# Patient Record
Sex: Female | Born: 1935
Health system: Southern US, Community
[De-identification: ages and names within clinical notes are randomized; demographics above are authoritative.]

## PROBLEM LIST (undated history)

## (undated) DIAGNOSIS — R269 Unspecified abnormalities of gait and mobility: Secondary | ICD-10-CM

## (undated) DIAGNOSIS — C44310 Basal cell carcinoma of skin of unspecified parts of face: Secondary | ICD-10-CM

## (undated) DIAGNOSIS — F411 Generalized anxiety disorder: Secondary | ICD-10-CM

## (undated) DIAGNOSIS — S2249XA Multiple fractures of ribs, unspecified side, initial encounter for closed fracture: Secondary | ICD-10-CM

## (undated) DIAGNOSIS — N390 Urinary tract infection, site not specified: Secondary | ICD-10-CM

## (undated) DIAGNOSIS — D649 Anemia, unspecified: Secondary | ICD-10-CM

## (undated) DIAGNOSIS — G4700 Insomnia, unspecified: Secondary | ICD-10-CM

## (undated) DIAGNOSIS — C44300 Unspecified malignant neoplasm of skin of unspecified part of face: Secondary | ICD-10-CM

## (undated) DIAGNOSIS — K219 Gastro-esophageal reflux disease without esophagitis: Secondary | ICD-10-CM

## (undated) DIAGNOSIS — I10 Essential (primary) hypertension: Secondary | ICD-10-CM

## (undated) DIAGNOSIS — L218 Other seborrheic dermatitis: Secondary | ICD-10-CM

## (undated) DIAGNOSIS — K117 Disturbances of salivary secretion: Secondary | ICD-10-CM

## (undated) DIAGNOSIS — N318 Other neuromuscular dysfunction of bladder: Secondary | ICD-10-CM

## (undated) DIAGNOSIS — IMO0002 Reserved for concepts with insufficient information to code with codable children: Secondary | ICD-10-CM

## (undated) DIAGNOSIS — R739 Hyperglycemia, unspecified: Secondary | ICD-10-CM

## (undated) DIAGNOSIS — F329 Major depressive disorder, single episode, unspecified: Secondary | ICD-10-CM

## (undated) DIAGNOSIS — F32A Depression, unspecified: Secondary | ICD-10-CM

## (undated) DIAGNOSIS — C50919 Malignant neoplasm of unspecified site of unspecified female breast: Secondary | ICD-10-CM

## (undated) DIAGNOSIS — M25519 Pain in unspecified shoulder: Secondary | ICD-10-CM

## (undated) DIAGNOSIS — S2220XA Unspecified fracture of sternum, initial encounter for closed fracture: Secondary | ICD-10-CM

## (undated) DIAGNOSIS — R1310 Dysphagia, unspecified: Secondary | ICD-10-CM

## (undated) DIAGNOSIS — M1990 Unspecified osteoarthritis, unspecified site: Secondary | ICD-10-CM

## (undated) DIAGNOSIS — D493 Neoplasm of unspecified behavior of breast: Secondary | ICD-10-CM

## (undated) DIAGNOSIS — H259 Unspecified age-related cataract: Secondary | ICD-10-CM

## (undated) DIAGNOSIS — E785 Hyperlipidemia, unspecified: Secondary | ICD-10-CM

## (undated) DIAGNOSIS — E559 Vitamin D deficiency, unspecified: Secondary | ICD-10-CM

## (undated) DIAGNOSIS — E871 Hypo-osmolality and hyponatremia: Secondary | ICD-10-CM

## (undated) DIAGNOSIS — R32 Unspecified urinary incontinence: Secondary | ICD-10-CM

## (undated) DIAGNOSIS — I872 Venous insufficiency (chronic) (peripheral): Secondary | ICD-10-CM

## (undated) DIAGNOSIS — I6200 Nontraumatic subdural hemorrhage, unspecified: Secondary | ICD-10-CM

## (undated) DIAGNOSIS — J309 Allergic rhinitis, unspecified: Secondary | ICD-10-CM

## (undated) DIAGNOSIS — M810 Age-related osteoporosis without current pathological fracture: Secondary | ICD-10-CM

## (undated) DIAGNOSIS — F418 Other specified anxiety disorders: Secondary | ICD-10-CM

## (undated) DIAGNOSIS — K644 Residual hemorrhoidal skin tags: Secondary | ICD-10-CM

## (undated) HISTORY — PX: BREAST SURGERY: SHX581

## (undated) HISTORY — DX: Unspecified urinary incontinence: R32

## (undated) HISTORY — DX: Generalized anxiety disorder: F41.1

## (undated) HISTORY — DX: Reserved for concepts with insufficient information to code with codable children: IMO0002

## (undated) HISTORY — DX: Vitamin D deficiency, unspecified: E55.9

## (undated) HISTORY — DX: Pain in unspecified shoulder: M25.519

## (undated) HISTORY — DX: Anemia, unspecified: D64.9

## (undated) HISTORY — DX: Allergic rhinitis, unspecified: J30.9

## (undated) HISTORY — DX: Other specified anxiety disorders: F41.8

## (undated) HISTORY — DX: Urinary tract infection, site not specified: N39.0

## (undated) HISTORY — DX: Hyperglycemia, unspecified: R73.9

## (undated) HISTORY — DX: Gastro-esophageal reflux disease without esophagitis: K21.9

## (undated) HISTORY — DX: Dysphagia, unspecified: R13.10

## (undated) HISTORY — DX: Disturbances of salivary secretion: K11.7

## (undated) HISTORY — DX: Malignant neoplasm of unspecified site of unspecified female breast: C50.919

## (undated) HISTORY — PX: APPENDECTOMY: SHX54

## (undated) HISTORY — PX: ABDOMINAL HYSTERECTOMY: SHX81

## (undated) HISTORY — PX: JOINT REPLACEMENT: SHX530

## (undated) HISTORY — DX: Depression, unspecified: F32.A

## (undated) HISTORY — DX: Unspecified malignant neoplasm of skin of unspecified part of face: C44.300

## (undated) HISTORY — DX: Insomnia, unspecified: G47.00

## (undated) HISTORY — DX: Unspecified abnormalities of gait and mobility: R26.9

## (undated) HISTORY — DX: Basal cell carcinoma of skin of unspecified parts of face: C44.310

## (undated) HISTORY — DX: Venous insufficiency (chronic) (peripheral): I87.2

## (undated) HISTORY — DX: Nontraumatic subdural hemorrhage, unspecified: I62.00

## (undated) HISTORY — DX: Essential (primary) hypertension: I10

## (undated) HISTORY — DX: Unspecified age-related cataract: H25.9

## (undated) HISTORY — DX: Unspecified osteoarthritis, unspecified site: M19.90

## (undated) HISTORY — DX: Other seborrheic dermatitis: L21.8

## (undated) HISTORY — DX: Major depressive disorder, single episode, unspecified: F32.9

## (undated) HISTORY — DX: Other neuromuscular dysfunction of bladder: N31.8

## (undated) HISTORY — DX: Unspecified fracture of sternum, initial encounter for closed fracture: S22.20XA

## (undated) HISTORY — DX: Multiple fractures of ribs, unspecified side, initial encounter for closed fracture: S22.49XA

## (undated) HISTORY — DX: Hypo-osmolality and hyponatremia: E87.1

## (undated) HISTORY — DX: Residual hemorrhoidal skin tags: K64.4

## (undated) HISTORY — DX: Hyperlipidemia, unspecified: E78.5

## (undated) HISTORY — DX: Neoplasm of unspecified behavior of breast: D49.3

## (undated) HISTORY — DX: Age-related osteoporosis without current pathological fracture: M81.0

---

## 1988-04-17 DIAGNOSIS — C50919 Malignant neoplasm of unspecified site of unspecified female breast: Secondary | ICD-10-CM

## 1988-04-17 HISTORY — DX: Malignant neoplasm of unspecified site of unspecified female breast: C50.919

## 1998-04-17 DIAGNOSIS — D493 Neoplasm of unspecified behavior of breast: Secondary | ICD-10-CM

## 1998-04-17 DIAGNOSIS — M810 Age-related osteoporosis without current pathological fracture: Secondary | ICD-10-CM

## 1998-04-17 HISTORY — DX: Age-related osteoporosis without current pathological fracture: M81.0

## 1998-04-17 HISTORY — DX: Neoplasm of unspecified behavior of breast: D49.3

## 1998-06-09 ENCOUNTER — Encounter: Payer: Self-pay | Admitting: Cardiology

## 1998-06-09 ENCOUNTER — Ambulatory Visit (HOSPITAL_COMMUNITY): Admission: RE | Admit: 1998-06-09 | Discharge: 1998-06-09 | Payer: Self-pay | Admitting: Cardiology

## 1998-06-11 ENCOUNTER — Encounter: Payer: Self-pay | Admitting: Cardiology

## 1998-06-11 ENCOUNTER — Ambulatory Visit (HOSPITAL_COMMUNITY): Admission: RE | Admit: 1998-06-11 | Discharge: 1998-06-11 | Payer: Self-pay | Admitting: Cardiology

## 1998-06-14 ENCOUNTER — Encounter: Payer: Self-pay | Admitting: Cardiology

## 1998-06-14 ENCOUNTER — Ambulatory Visit (HOSPITAL_COMMUNITY): Admission: RE | Admit: 1998-06-14 | Discharge: 1998-06-14 | Payer: Self-pay | Admitting: Cardiology

## 1998-06-25 ENCOUNTER — Encounter: Payer: Self-pay | Admitting: General Surgery

## 1998-06-25 ENCOUNTER — Ambulatory Visit (HOSPITAL_COMMUNITY): Admission: RE | Admit: 1998-06-25 | Discharge: 1998-06-25 | Payer: Self-pay | Admitting: General Surgery

## 1998-07-14 ENCOUNTER — Inpatient Hospital Stay (HOSPITAL_COMMUNITY): Admission: RE | Admit: 1998-07-14 | Discharge: 1998-07-16 | Payer: Self-pay | Admitting: General Surgery

## 1999-04-18 DIAGNOSIS — I872 Venous insufficiency (chronic) (peripheral): Secondary | ICD-10-CM

## 1999-04-18 HISTORY — DX: Venous insufficiency (chronic) (peripheral): I87.2

## 1999-10-04 ENCOUNTER — Encounter: Payer: Self-pay | Admitting: Orthopedic Surgery

## 1999-10-04 ENCOUNTER — Encounter: Admission: RE | Admit: 1999-10-04 | Discharge: 1999-10-04 | Payer: Self-pay | Admitting: Orthopedic Surgery

## 1999-10-06 ENCOUNTER — Ambulatory Visit (HOSPITAL_BASED_OUTPATIENT_CLINIC_OR_DEPARTMENT_OTHER): Admission: RE | Admit: 1999-10-06 | Discharge: 1999-10-06 | Payer: Self-pay | Admitting: Orthopedic Surgery

## 2000-06-21 ENCOUNTER — Encounter: Payer: Self-pay | Admitting: Orthopedic Surgery

## 2000-06-21 ENCOUNTER — Inpatient Hospital Stay (HOSPITAL_COMMUNITY): Admission: RE | Admit: 2000-06-21 | Discharge: 2000-06-27 | Payer: Self-pay | Admitting: Orthopedic Surgery

## 2000-06-25 ENCOUNTER — Encounter: Payer: Self-pay | Admitting: Orthopedic Surgery

## 2000-06-27 ENCOUNTER — Inpatient Hospital Stay (HOSPITAL_COMMUNITY)
Admission: RE | Admit: 2000-06-27 | Discharge: 2000-07-04 | Payer: Self-pay | Admitting: Physical Medicine & Rehabilitation

## 2001-02-08 ENCOUNTER — Ambulatory Visit (HOSPITAL_COMMUNITY): Admission: RE | Admit: 2001-02-08 | Discharge: 2001-02-08 | Payer: Self-pay | Admitting: Ophthalmology

## 2001-03-28 ENCOUNTER — Other Ambulatory Visit: Admission: RE | Admit: 2001-03-28 | Discharge: 2001-03-28 | Payer: Self-pay | Admitting: Gynecology

## 2003-04-18 DIAGNOSIS — R32 Unspecified urinary incontinence: Secondary | ICD-10-CM

## 2003-04-18 DIAGNOSIS — F411 Generalized anxiety disorder: Secondary | ICD-10-CM

## 2003-04-18 DIAGNOSIS — H259 Unspecified age-related cataract: Secondary | ICD-10-CM

## 2003-04-18 DIAGNOSIS — M1990 Unspecified osteoarthritis, unspecified site: Secondary | ICD-10-CM

## 2003-04-18 DIAGNOSIS — K219 Gastro-esophageal reflux disease without esophagitis: Secondary | ICD-10-CM

## 2003-04-18 DIAGNOSIS — F329 Major depressive disorder, single episode, unspecified: Secondary | ICD-10-CM

## 2003-04-18 DIAGNOSIS — E785 Hyperlipidemia, unspecified: Secondary | ICD-10-CM

## 2003-04-18 DIAGNOSIS — G4700 Insomnia, unspecified: Secondary | ICD-10-CM

## 2003-04-18 DIAGNOSIS — D649 Anemia, unspecified: Secondary | ICD-10-CM

## 2003-04-18 DIAGNOSIS — F3289 Other specified depressive episodes: Secondary | ICD-10-CM

## 2003-04-18 DIAGNOSIS — I10 Essential (primary) hypertension: Secondary | ICD-10-CM

## 2003-04-18 HISTORY — DX: Insomnia, unspecified: G47.00

## 2003-04-18 HISTORY — DX: Hyperlipidemia, unspecified: E78.5

## 2003-04-18 HISTORY — DX: Unspecified urinary incontinence: R32

## 2003-04-18 HISTORY — DX: Major depressive disorder, single episode, unspecified: F32.9

## 2003-04-18 HISTORY — DX: Other specified depressive episodes: F32.89

## 2003-04-18 HISTORY — DX: Generalized anxiety disorder: F41.1

## 2003-04-18 HISTORY — DX: Anemia, unspecified: D64.9

## 2003-04-18 HISTORY — DX: Unspecified osteoarthritis, unspecified site: M19.90

## 2003-04-18 HISTORY — DX: Essential (primary) hypertension: I10

## 2003-04-18 HISTORY — DX: Unspecified age-related cataract: H25.9

## 2003-04-18 HISTORY — DX: Gastro-esophageal reflux disease without esophagitis: K21.9

## 2004-02-20 ENCOUNTER — Inpatient Hospital Stay (HOSPITAL_COMMUNITY): Admission: EM | Admit: 2004-02-20 | Discharge: 2004-02-24 | Payer: Self-pay | Admitting: Emergency Medicine

## 2004-04-17 DIAGNOSIS — N318 Other neuromuscular dysfunction of bladder: Secondary | ICD-10-CM

## 2004-04-17 DIAGNOSIS — IMO0002 Reserved for concepts with insufficient information to code with codable children: Secondary | ICD-10-CM

## 2004-04-17 DIAGNOSIS — S2220XA Unspecified fracture of sternum, initial encounter for closed fracture: Secondary | ICD-10-CM

## 2004-04-17 HISTORY — DX: Reserved for concepts with insufficient information to code with codable children: IMO0002

## 2004-04-17 HISTORY — DX: Unspecified fracture of sternum, initial encounter for closed fracture: S22.20XA

## 2004-04-17 HISTORY — DX: Other neuromuscular dysfunction of bladder: N31.8

## 2004-04-22 ENCOUNTER — Encounter (INDEPENDENT_AMBULATORY_CARE_PROVIDER_SITE_OTHER): Payer: Self-pay | Admitting: Specialist

## 2004-04-22 ENCOUNTER — Ambulatory Visit (HOSPITAL_COMMUNITY): Admission: RE | Admit: 2004-04-22 | Discharge: 2004-04-22 | Payer: Self-pay | Admitting: Urology

## 2004-04-22 ENCOUNTER — Ambulatory Visit (HOSPITAL_BASED_OUTPATIENT_CLINIC_OR_DEPARTMENT_OTHER): Admission: RE | Admit: 2004-04-22 | Discharge: 2004-04-22 | Payer: Self-pay | Admitting: Urology

## 2004-10-07 ENCOUNTER — Emergency Department (HOSPITAL_COMMUNITY): Admission: EM | Admit: 2004-10-07 | Discharge: 2004-10-07 | Payer: Self-pay | Admitting: Emergency Medicine

## 2005-04-17 DIAGNOSIS — K117 Disturbances of salivary secretion: Secondary | ICD-10-CM

## 2005-04-17 DIAGNOSIS — I6200 Nontraumatic subdural hemorrhage, unspecified: Secondary | ICD-10-CM

## 2005-04-17 DIAGNOSIS — R269 Unspecified abnormalities of gait and mobility: Secondary | ICD-10-CM

## 2005-04-17 DIAGNOSIS — L218 Other seborrheic dermatitis: Secondary | ICD-10-CM

## 2005-04-17 HISTORY — DX: Nontraumatic subdural hemorrhage, unspecified: I62.00

## 2005-04-17 HISTORY — DX: Disturbances of salivary secretion: K11.7

## 2005-04-17 HISTORY — DX: Other seborrheic dermatitis: L21.8

## 2005-04-17 HISTORY — DX: Unspecified abnormalities of gait and mobility: R26.9

## 2005-10-31 ENCOUNTER — Ambulatory Visit (HOSPITAL_BASED_OUTPATIENT_CLINIC_OR_DEPARTMENT_OTHER): Admission: RE | Admit: 2005-10-31 | Discharge: 2005-10-31 | Payer: Self-pay | Admitting: Urology

## 2005-12-23 ENCOUNTER — Inpatient Hospital Stay (HOSPITAL_COMMUNITY): Admission: EM | Admit: 2005-12-23 | Discharge: 2005-12-26 | Payer: Self-pay | Admitting: *Deleted

## 2005-12-25 ENCOUNTER — Encounter (INDEPENDENT_AMBULATORY_CARE_PROVIDER_SITE_OTHER): Payer: Self-pay | Admitting: *Deleted

## 2006-01-15 ENCOUNTER — Encounter: Admission: RE | Admit: 2006-01-15 | Discharge: 2006-01-15 | Payer: Self-pay | Admitting: Neurosurgery

## 2006-04-17 DIAGNOSIS — M25519 Pain in unspecified shoulder: Secondary | ICD-10-CM

## 2006-04-17 HISTORY — DX: Pain in unspecified shoulder: M25.519

## 2007-01-19 ENCOUNTER — Emergency Department (HOSPITAL_COMMUNITY): Admission: EM | Admit: 2007-01-19 | Discharge: 2007-01-19 | Payer: Self-pay | Admitting: Emergency Medicine

## 2007-04-18 DIAGNOSIS — E559 Vitamin D deficiency, unspecified: Secondary | ICD-10-CM

## 2007-04-18 HISTORY — DX: Vitamin D deficiency, unspecified: E55.9

## 2008-04-17 HISTORY — PX: MOHS SURGERY: SHX181

## 2009-04-17 HISTORY — PX: CYST EXCISION: SHX5701

## 2010-04-17 DIAGNOSIS — C44300 Unspecified malignant neoplasm of skin of unspecified part of face: Secondary | ICD-10-CM

## 2010-04-17 HISTORY — DX: Unspecified malignant neoplasm of skin of unspecified part of face: C44.300

## 2010-05-18 ENCOUNTER — Other Ambulatory Visit: Payer: Self-pay | Admitting: Dermatology

## 2010-09-02 NOTE — H&P (Signed)
[address]. [facility]  Patient:    [name], [name] Visit Number: [id number] MRN: [id number]          Service Type: DSU Location: [facility] [id number] Attending Physician:  Ivor Messier Dictated by:   Guadelupe Sabin, M.D. Admit Date:  02/08/2001 Discharge Date: 02/08/2001   CC:         Maisie Fus A. Patty Sermons, M.D.   History and Physical  CHIEF COMPLAINT: This was a planned outpatient surgical admission of this 77 year old white female, admitted for cataract implant surgery of the left eye.  HISTORY OF PRESENT ILLNESS: This patient has been followed in my office since October 25, 1998 for progressive cataract formation, greater in the left than right eye.  Initial examination revealed an acuity of 20/50 -2 left eye, which was at that time corrected and improved with changing glasses.  Nuclear cataract formation was noted in both eyes, greater in the left than right eye. Over the ensuing years the cataract has become progressively worse and vision could no longer be improved with spectacle correction.  The patient has elected to proceed with cataract surgery as she now is using a magnifying lens for doing her near work and has trouble with tearing and difficulty with vision at night, during bright sunlight, and is having some difficulty doing her usual tasks of driving, seeing road signs, television, reading, night vision, bright light vision, difficulty with housework, hobbies, colors are dim and dull, trouble with depth perception.  She signed an informed consent and arrangements were made for her outpatient admission.  PAST MEDICAL HISTORY: The patient is under the care of Dr. Ronny Flurry.  Dr. Patty Sermons feels the patient is a satisfactory risk for the proposed surgery. The patient takes multiple medications under his direction.  REVIEW OF SYSTEMS: No cardiorespiratory complaints.  PHYSICAL EXAMINATION:  VITAL SIGNS: As recorded on admission,  blood pressure 153/70, temperature 97.1 degrees, heart rate 64, respirations 16.  GENERAL APPEARANCE: The patient is a pleasant, petite 77 year old white female, in no acute distress.  HEENT: Eyes, visual acuity with best correction 20/40 right eye, 20/60- left eye.  Applanation tenometry 15 mm each eye.  Slit-lamp examination, bilateral nuclear cataract formation.  Fundus examination, dilated, reveals cataract haze.  The vitreous is clear.  The retina is attached.  The optic nerve is sharply outlined and good color.  Disc/cup ratio 0.4.  The retinal blood vessels and macula appear normal.  CHEST: Lungs clear to percussion and auscultation.  HEART: Normal sinus rhythm.  No cardiomegaly, no murmurs.  ABDOMEN: Negative.  EXTREMITIES: Negative.  ADMISSION DIAGNOSIS: Senile nuclear cataract, both eyes.  SURGICAL PLAN: Cataract implant surgery, left eye now and right eye later. Dictated by:   Guadelupe Sabin, M.D. Attending Physician:  Ivor Messier DD:  02/08/01 TD:  02/09/01 Job: [id number]

## 2010-09-02 NOTE — H&P (Signed)
[name], [name]                ACCOUNT NO.:  [id number]   MEDICAL RECORD NO.:  [id number]          PATIENT TYPE:  INP   LOCATION:  1413                         FACILITY:  [facility]   PHYSICIAN:  Della Goo, M.D. DATE OF BIRTH:  1917/04/03   DATE OF ADMISSION:  12/22/2005  DATE OF DISCHARGE:                                HISTORY & PHYSICAL   ADDENDUM:  This is continuation.   PHYSICAL EXAMINATION:  BREASTS:  Right breast; no unusual masses, no nipple  discharge.  No axillary or supraclavicular adenopathy.  ABDOMEN:  Positive bowel sounds; soft, nontender, nondistended.  No  hepatosplenomegaly.  EXTREMITIES:  Without edema.  Pulses 2+ in the dorsal pedis bilaterally.  NEUROLOGIC:  The patient is alert and oriented x3.  Able to move all 4  extremities.  There are mild abrasions, stage 1, in bilateral palm areas.  There is pain on palpation of the left wrist; however, there is full range  of motion of the left wrist.  GENITOURINARY:  Deferred.   OUTPATIENT MEDICATIONS:  1. Norvasc 5 mg one p.o. q.d.  2. Diovan 80 mg one p.o. q.d.  3. Paxil 25 mg one p.o. q.d.  4. Nexium 40 mg one p.o. q.d.  5. Forteo 750 mg per 3 mL subcutaneously q.h.s.  6. Colace 100 mg p.o. q.h.s.  7. Multivitamin one p.o. q.d. with minerals.   ASSESSMENT:  An 77 year old female admitted following a fall secondary to a  syncopal episode.  1. Syncope.  2. Small subdural hematoma.  3. Left frontal hematoma.  4. History of hypertension.  5. Osteoporosis.  6. Skin cancer.  7. History of cataract.   PLAN:  The patient has been admitted to a telemetry area for cardiac  monitoring. Cardiac enzymes will be performed q.8 h. x3.  A carotid  ultrasound study and MRI of the brain will be ordered for further evaluation  of syncope.  The patient will be placed on GI and DVT prophylaxis.  She will  also be placed on fall precautions and will be out of bed with assistance.  She will continue on her regular  medications and orthostatic vital signs  will be checked in the a.m.   LABORATORY STUDIES:  White blood cell count 6.9, hemoglobin 12.5, hematocrit  35.2, platelets 205, neutrophils 76%, lymphocytes 18%.  Sodium 139,  potassium 3.6, CO2 30, chloride 104, BUN 26, creatinine 1.0, calcium 9.3.      Della Goo, M.D.  Electronically Signed     HJ/MEDQ  D:  12/23/2005  T:  12/23/2005  Job:  [id number]

## 2010-09-02 NOTE — Op Note (Signed)
[place]. [facility]  Patient:    [name], [name]                       MRN: [id number] Proc. Date: 06/21/00 Adm. Date:  [id number] Attending:  Colbert Ewing                           Operative Report  PREOPERATIVE DIAGNOSIS:  End-stage degenerative joint disease, left knee with marked varus alignment and flexion contracture.  Erosive bony changes, medial compartment.  POSTOPERATIVE DIAGNOSIS:  End-stage degenerative joint disease, left knee with marked varus alignment and flexion contracture.  Erosive bony changes, medial compartment.  OPERATION PERFORMED:  Left total knee replacement utilizing Osteonics prosthesis.  Press-Fit #7 femoral component.  Cemented #7 tibial component with 8 mm polyethylene insert.  Cemented recess nonmetal back, 26 mm patellar component.  Soft tissue balancing with medial release and also lateral retinacular release.  SURGEON:  Loreta Ave, M.D.  ASSISTANT:  Arlys John D. Petrarca, P.A.-C.  ANESTHESIA:  General.  ESTIMATED BLOOD LOSS:  Minimal.  SPECIMENS:  Excised bone and soft tissue.  COMPLICATIONS:  None.  CULTURES:  None.  DRESSING:  Soft compressive.  DRAINS:  Hemovac x 2.  TOURNIQUET TIME:  One hour.  DESCRIPTION OF PROCEDURE:  The patient was brought to the operating room and placed on the operating table in supine position.  After adequate anesthesia had been obtained, the left knee examined.  Almost a 10 degree flexion contracture with alignment in 10 degrees of varus, relatively fixed.  Flexion to about 90 degrees.  Tourniquet applied upper aspect of the left leg. Prepped and draped in the usual sterile fashion.  Exsanguinated with elevation and Esmarch.  Tourniquet inflated to 300 mmHg.  Straight incision above the patella down to the tibial tubercle.  Medial parapatellar arthrotomy with hemostasis obtained with electrocautery.  Knee exposed.  Extensive erosive changes with a 1 cm deep  groove cut into the medial femoral condyle where it had been rotting on the margin of the tibial plateau with erosive changes on the plateau as well.  Diffuse grade 4 changes in the lateral compartment and patellofemoral joint as well.  Remnants of menisci, periarticular spurring, cruciate ligaments all excised because of degree of contracture.  Medial capsular soft tissue release.  Distal femur exposed.  Intramedullary guide placed.  Distal cut set at 5 degrees of valgus removing 10 mm.  Sized for a #7 posterior stabilizing component.  Jigs put in place.  Definitive cuts made. Trial put in place and found to fit well.  Trial removed.  Tibial exposed. Tibial spine removed with a saw.  Intramedullary guide placed.  5 degree posterior slope cut set at 0 on the deficient medial side.  Collaterals and posterior structures protected.  Sized to a #7 component.  After appropriate soft tissue release and removal of all spurs, the #8 trial was placed on the tibia and femur.  With the 8 mm insert, I had full extension, full flexion and no component lift off with flexion.  The patella was then sized, reamed and drilled for a 26 mm component.  Trial put in place.  Lateral release necessary to balance the patellofemoral joint.  All trials removed.  Copious irrigation with a pulse irrigating device.  Cement prepared, placed on a tibial component which was hammered into place.  Polyethylene attached.  Femoral component hammered in place as well.  Patellar  component seated and compressed with cement in place.  Exessive cement removed.  Once the cement had hardened, the knee was re-examined with full extension, full flexion, nicely balanced knee set at 5 degrees of valgus and good patellofemoral tracking at the lateral release.  Wound irrigated.  Arthrotomy closed with #1 Vicryl after Hemovacs were placed and brought out through separate stab wounds.  Skin and subcutaneous tissues closed with Vicryl suture  and staples.  Margins of wound injected with Marcaine as was the knee.  Hemovac clamped.  Sterile compressive dressing applied.  Tourniquet deflated and removed.  Knee immobilizer applied. Anesthesia reversed.  Brought to recovery room.  Tolerated surgery well.  No complications. DD:  06/21/00 TD:  06/22/00 Job: [id number]

## 2010-09-02 NOTE — H&P (Signed)
[name], [name]                ACCOUNT NO.:  [id number]   MEDICAL RECORD NO.:  [id number]          PATIENT TYPE:  EMS   LOCATION:  ED                           FACILITY:  [facility]   PHYSICIAN:  Zenaida Deed. Mayford Knife, M.D.DATE OF BIRTH:  06-17-1916   DATE OF ADMISSION:  02/20/2004  DATE OF DISCHARGE:                                HISTORY & PHYSICAL   PRIMARY CARE PHYSICIAN:  Dr. Lenon Curt. [place].   Resident of [facility].   CHIEF COMPLAINT:  Weakness, fever.   HISTORY OF PRESENT ILLNESS:  [name] is an 77 year old woman who called  for help on her [facility] today and was brought to the emergency department  by ambulance after sustaining a fall related to weakness.   When she awoke this morning, she experienced shaking chills and noted that  she had a worsened cough and shortness of breath accompanied by mild nausea  without vomiting or diarrhea, anorexia, and feeling ill.  Yesterday she felt  reasonably well, but has been somewhat hampered by about one week of a mild  nonproductive cough, some mild rhinorrhea and some hoarseness.  She has not  been obviously febrile until today.  She received a flu shot and Pneumovax  this fall but does not recall the date.  She has a history of pneumonia  about 20 years ago and otherwise has had good lung health.   REVIEW OF SYSTEMS:  She denies dizziness but feels somewhat weak.  There has  been no headache.  No sputum production.  No chest pain.  No vomiting or  change in bowel habits which are normal.  No focal numbness or weakness.  No  new aches or pains.  She has sustained about a 10-pound weight loss over the  course of the past couple of months surrounding a bladder infection which  was treated and which caused a significant enough decline that she was  admitted to the rehab portion of [facility] for a couple of weeks, followed  by assisted living for a couple of weeks and finally returning to home.  She  did feel like  her appetite was starting to resume.   Yesterday, she saw Dr. Wanda Plump for evaluation of her overactive bladder.  He performed a cystoscopy and installation of some type of medication which  makes me think that she probably has a contracted bladder; apparently he  found a suspicious area which he would like to examine further on biopsy at  a future point.  She has had no dysuria following this test.   PAST MEDICAL HISTORY:  1.  History of osteoarthritis, status post left total knee replacement a few      years ago by Dr. Eulah Pont.  2.  History of hypertension.  3.  History of breast cancer, status post right modified mastectomy, March      2000, which was stage 1 adenoma.  A left mammogram spring 2005 was      within normal limits.  4.  History of GERD.  5.  History of hysterectomy and appendectomy.  6.  History of overactive  bladder as above.  7.  History of hyperlipidemia.  8.  History of osteoporosis with kyphosis.  9.  History of cholelithiasis managed by low-fat diet.  10. History of anxiety and depression.  11. History of basal cell cancer of the face.   MEDICATIONS:  Per list updated January 15, 2004, indicates the following.  1.  Diovan HCT 160/12.5 mg p.o. q.day.  2.  Norvasc 5 mg p.o. q.day.  3.  Centrum Silver 1 p.o. q.day.  4.  Glucosamine chondroitin MSM q.day.  5.  Lipitor 10 mg p.o. q.day.  6.  Colace 100 mg 1 to 2 q.day.  7.  Nexium 40 mg q.day.  8.  Ecotrin 81 mg p.o. q.h.s.  9.  Zoloft 150 mg p.o. q.day.   ALLERGIES:  Questionable allergy to SULFA and an EPIDURAL medicine.   FAMILY HISTORY:  Unremarkable.   SOCIAL HISTORY:  Patient is a widow of retired OB/GYN, Dr. Sherilyn Cooter, who passed  away 5 years ago.  She has a son who is a Careers adviser involved in the [employer], and a nurse practitioner daughter who practices with a  GYN group.  She has never smoked and has rarely consumed alcoholic  beverages.  She resides at [facility] independently.   Lately has had  assistance from caretakers following her recent urinary tract illness.   PHYSICAL EXAMINATION:  Temperature 101.8 on arrival, heart rate 88, blood  pressure 116/58, respirations 20.  Oxygenation 95% on 2 liters.  In general,  she is a well-developed, well-nourished, although somewhat frail, elderly  lady appearing in no acute distress.  She is delightful in conversation,  fully oriented despite feeling somewhat ill.  Her face is symmetric, native  teeth are in good repair but with some mild gingival disease, and the mouth  is notable dry.  Her neck is supple without JVD.  The lungs are clear to  auscultation anteriorly but posteriorly in the bases reveal dense, dry-  sounding crackles with poor air flow.  The heart is regular rate and rhythm  with no significant murmur, rub or gallop.  The abdomen is normal in  contour, bowel sounds, and is nontender.  Lower extremities are well  developed with full distal pedal pulses but with evidence of chronic venous  stasis hyperpigmentation about the ankles.  She moves all extremities  equally, is able to move in different positions for the exam, and  neurologically has no sensory or motor deficits.  Her skin is hot, dry, with  diminished turgor over the arms without rash.   LABS:  Urinalysis: Specific gravity 1.014 and otherwise unremarkable.  CBC:  White blood cell count 15.5, hemoglobin 12.2, platelets 177, absolute  granulocyte count 15.3 with greater than 20% bands.  Sodium 136, potassium  3.2, chloride 99, carbon dioxide 27, BUN 18, creatinine 1.0, glucose 106.  Albumin 3.1.  Hepatocellular enzymes normal.  ABG on room air: pH 7.5, pCO2  39, pO2 51, bicarbonate 29.  Chest x-ray is notable for kyphosis and for  chronic interstitial changes bilaterally.  There is no focal infiltrate and  no focal nodularity.   ASSESSMENT AND PLAN: 1.  Presentation is most consistent with pneumonia, and the acute onset is      suspicious for  pneumococcal.  The chronic lung findings are curious.  I      will treat presumptively with Rocephin and azithromycin and recheck film      on Monday.  If the clinical scenario changes or if no  focal infiltrate      is evident on follow-up film after hydration, I would recommend a chest      CT.  It appears that her prior breast cancer was fairly localized and a      metastatic problem would be unlikely, but we cannot afford to overlook      this possibility.  There is no clinical suspicion of pulmonary embolus      at this time.  There is no underlying chronic obstructive pulmonary      disease.   1.  Hypertension.  I will withhold antihypertensives until a trend      establishes requirement for such.   1.  Remainder of chronic medical problems are stable and will require no      acute intervention aside from continuation of her usual medications.      JAW/MEDQ  D:  02/20/2004  T:  02/20/2004  Job:  [id number]   cc:   Lenon Curt. Chilton Si, M.D.  [address]  [place]  [place] [address]  Fax: [phone]

## 2010-09-02 NOTE — Discharge Summary (Signed)
[place]. [facility]  Patient:    [name], [name]                       MRN: [id number] Adm. Date:  [id number] Disc. Date: [id number] Attending:  Faith Rogue T Dictator:   Oris Drone. Petrarca, P.A.-C.                           Discharge Summary  ADMISSION DIAGNOSIS:  Advanced degenerative joint disease of the left knee.  DISCHARGE DIAGNOSIS: 1. Advanced degenerative joint disease of the left knee. 2. Posthemorrhagic anemia. 3. Hypertension. 4. Esophageal reflux. 5. Pruritic disorders.  PROCEDURE:  Left total knee replacement.  HISTORY:  This is an 77 year old white female with a long-standing degenerative joint disease of her left knee.  She has failed conservative treatment including arthroscopy and ______ .  She has essentially grade 4 degenerative joint disease of the left knee.  She has failed conservative treatment and is now indicated for a total knee replacement.  HOSPITAL COURSE:  This is an 77 year old white female admitted on June 21, 2000, and after appropriate lab studies were obtained, was taken to the operating room that day where she underwent a left total knee replacement. She tolerated the procedure well.  She was placed pre and postoperatively on Ancef prophylaxis 1 g IV q.8h. for three doses.  Coumadin was begun as per protocol as well as heparin 2500 units subcutaneous q.12h. until the Coumadin became therapeutic.  Consults with PT, OT, rehabilitation, and social service were obtained.  The ambulation is weightbearing as tolerated with a walker on the left.  CPM 0 to 90 degrees, 8 hours per day, increasing to 110 degrees max.  Knee immobilizer for ambulation.  A Foley was placed intraoperatively. Total knee protocols were used.  An epidural catheter was placed intraoperatively for postoperative pain management.  She did have marked difficulty with pruritus secondary to her epidural.  This was eliminated once the epidural was  discontinued.  Epidural pulled on postoperative day #2 as well as her Foley catheter.  She was placed on Vicodin 5 mg 1-2 q.4-6h. for pain.  She was typed and crossed for 2 units and transfused on the 9th.  She did have some difficulty with abdominal discomfort and three-way abdomen was obtained.  She was placed on a low-fat diet and this resolved.  She was transferred to rehabilitation on the 13th, where she will continue with physical and occupational therapy.  LABORATORY STUDIES:  Preoperative hemoglobin was 11.5, hematocrit 34.3%, white count 5300, platelets 197,000.  Discharge hemoglobin 9.6, hematocrit 28.4%, white count 5100, platelets 261,000.  Chemistries:  Preoperative sodium 137, potassium 4.2, chloride 100, CO2 30, glucose 107, BUN 26, creatinine 1.2, calcium 9.5, total protein 6.3, albumin 3.7, AST 26, ALT 21, ALP 58, total bilirubin 0.5.  Discharge saturation was 135, potassium 3.0, chloride 93, CO2 36, glucose 98, BUN 13, creatinine 0.9, calcium 8.5.  Urinalysis ______ urine.  She was blood type B positive, antibody screen negative.  EKG showed normal sinus rhythm with nonspecific T-wave abnormality.  Abdomen with chest of June 25, 2000 revealed no active cardiopulmonary disease and an unremarkable bowel gas pattern.  Left knee postoperative June 21, 2000 revealed satisfactory left knee arthroplasty.  Chest x-ray of October 04, 1999 revealed status post right mastectomy with accentuation of hilar and peribronchial left lingula and right middle lung regions.  No evidence of metastatic disease or  acute infiltrate.  DISCHARGE INSTRUCTIONS:  She was transferred to rehabilitation where she will continue with her physical and occupational therapy as per protocol.  DISCHARGE CONDITION:  Discharged in improved condition. DD:  08/10/00 TD:  08/12/00 Job: [id number]

## 2010-09-02 NOTE — Op Note (Signed)
[place]. [facility]  Patient:    [name], [name] Visit Number: [id number] MRN: [id number]          Service Type: DSU Location: [facility] [id number] [id number] Attending Physician:  Ivor Messier Dictated by:   Guadelupe Sabin, M.D. Proc. Date: 02/08/01 Admit Date:  02/08/2001 Discharge Date: 02/08/2001   CC:         Maisie Fus A. Patty Sermons, M.D.   Operative Report  PREOPERATIVE DIAGNOSIS: Senile nuclear cataract, left eye.  POSTOPERATIVE DIAGNOSIS: Senile nuclear cataract, left eye.  OPERATION/PROCEDURE: Planned extracapsular cataract extraction with phacoemulsification, primary insertion of posterior chamber intraocular lens implant.  SURGEON: Guadelupe Sabin, M.D.  ASSISTANT: Nurse.  ANESTHESIA: Local 4% xylocaine, 0.75% Marcaine retrobulbar block, topical tetracaine, intraocular xylocaine.  Anesthesia standby required.  Patient given sodium pentothal or Ditropan intravenously during the period of retrobulbar injection.  DESCRIPTION OF PROCEDURE: After the patient was prepped and draped a lid speculum was inserted in the left eye.  The eye was turned downward and a superior rectus traction suture placed.  Schiotz tenometry was recorded at 7-8 scale units with a 5.5 g weight.  A peritomy was performed adjacent to the limbus from the 11 oclock to one oclock position.  The corneoscleral junction was cleaned and a corneoscleral groove made with a 45 degree Superblade.  The anterior chamber was then entered with the 2.5 mm diamond keratome at the 12 oclock position and the 15 degree blade at the 2:30 oclock position.  Using a bent 26 gauge needle on a Healon syringe a circular capsulorrhexis was begun and then completed with the Grabow forceps. Hydrodissection and hydrodelineation were performed using 1% xylocaine.  The 30 degree phacoemulsification tip was then inserted, with slow controlled emulsification of the lens nucleus.  The rather firm nucleus was  chopped from behind with the Bechert pick.  Following removal of the nucleus the residual cortex was aspirated.  A small amount of residual cortex remained at the 12 oclock position under the iris and under the capsulorrhexis.  This was difficult to aspirate.  The posterior capsule appeared intact and somewhat thickened, with some posterior capsule plaque haze.  This was difficult to remove but it was elected not to perform a posterior capsulotomy at this time. It was therefore elected to insert an [employer] Optics [id number] silicone posterior chamber intraocular lens implant with UV absorber, diopter strength +19.50.  This was inserted with the [employer] forceps into the anterior chamber and then centered into the capsular bag using the Lister lens rotator. The lens appeared to be well centered.  The Healon which had been used during the procedure was aspirated and replaced with balanced salt and Miochol ophthalmic solution.  The operative incisions appeared to be self-sealing no sutures were required.  Maxitrol ointment was instilled in the conjunctival cul-de-sac and a light patch and protective shield applied.  Duration of procedure and anesthesia administration, 45 minutes.  The patient tolerated the procedure well in general and left the operating room for the recovery room in good condition. Dictated by:   Guadelupe Sabin, M.D. Attending Physician:  Ivor Messier DD:  02/08/01 TD:  02/09/01 Job: [id number]

## 2010-09-02 NOTE — Discharge Summary (Signed)
[name], [name]                ACCOUNT NO.:  [id number]   MEDICAL RECORD NO.:  [id number]          PATIENT TYPE:  INP   LOCATION:  1413                         FACILITY:  [facility]   PHYSICIAN:  Hettie Holstein, D.O.    DATE OF BIRTH:  [birth year]   DATE OF ADMISSION:  12/22/2005  DATE OF DISCHARGE:  12/26/2005                           DISCHARGE SUMMARY - REFERRING   PRIMARY CARE PHYSICIAN:  Lenon Curt. Chilton Si, M.D.   Patient is a resident of [facility].   NEUROSURGEON:  Reinaldo Meeker, M.D., which he has a follow-up appointment  on January 02, 2006.   REASON FOR ADMISSION:  Status post fall.   DIAGNOSIS:  Syncope, undetermined etiology, negative Doppler studies, 2-D  echocardiogram  currently pending.  Did exhibit some orthostasis early in  her hospitalization.  Her antihypertensive medication, Avapro, was held and  she developed no further symptoms during her hospital course.  Her 2-D  echocardiogram  results are currently pending and I have asked that she  follow up on these results following discharge.   ADDITIONAL DIAGNOSES:  1. Subdural hematoma, status post fall, status post consultation with Dr.      Gerlene Fee of neurosurgery who performed serial CTs and felt that she was      medically stable for discharge and follow-up in the outpatient setting.  2. History of compression fracture.  3. History of osteoarthritis.  4. Left total knee replacement.  5. History of breast cancer.  6. History of hypertension.   DISCHARGE MEDICATIONS:  As noted above.  She is instructed to hold her  aspirin x1 week and as well, hold her Diovan until further instructions.  She is asked to use Tylenol for management of headaches.  She should  continue all of her medications as prior to admission including:  1. Vitamin daily.  2. Colace 100 mg b.i.d.  3. Norvasc 5 mg daily.  4. Nexium 40 mg daily.  5. Lipitor 10 mg nightly.  6. Citracal with D three times a day.  7. Tylenol 650 q.4-6h.  p.r.n.  8. Boniva 150 mg monthly as before.  9. Ultram 50 mg p.o. q.6h. p.r.n.  10.Zyrtec 10 mg daily.  11.Lunesta 1 mg p.o. nightly.  12.Paxil CR 25 mg daily.  13.Forteo 20 mcg subcu daily as before.   LABORATORY TESTS PERFORMED THIS ADMISSION:  Carotid Dopplers, which were  unremarkable.  Due to weekend, 2-D echocardiogram  was delayed and we do not  have these results available at this time but are available on follow-up.  I  have provided the family with my phone number for which they can contact me  if they are unable to receive these results __________ primary care  physician.  She underwent MRI of the brain that revealed left middle cranial  fossa and anterior cranial fossa with subdural hematoma which was no more  than a few millimeters thick and does not have any significant mass effect  upon the brain.  It was not felt that it had enlarged since recent CT scan.  There were small intraparenchymal hemorrhages in the left frontal  subcortical region and in the right parietal white matter.  Both of these  are small and no not exert any mass effect.  There was atrophy of the  chronic small vessel changes elsewhere.   LABORATORY DATA:  Sodium 141, potassium 3.7, BUN 24, creatinine 1.1, calcium  9.5.  Cardiac panels were all negative and within normal limits.  TSH was  1.365.   X-rays of her right wrist were also unremarkable for fracture.  CT of her  head revealed small left frontotemporal subdural hematoma and there is a  questionable nondisplaced calvarial fracture.   DISPOSITION:  [name] has a follow-up appointment with Dr. Gerlene Fee on  January 02, 2006, at 4 p.m. at [facility].  She is instructed  to contact Dr. Frederik Pear for follow-up this week.   HISTORY OF PRESENT ILLNESS:  For full details, please refer to the H&P as  dictated by Dr. Della Goo.  However, briefly, [name] is an 77-  year-old female resident of [facility] who  suffered a fall  after passing out while walking in the courtyard at the facility shortly  after her evening meal.  She denied any chest pain or shortness of breath  associated with episodes and denies also having any nausea, vomiting or  diarrhea, fever or chills.  She did not have any loss of balance associated  with episode either.  She did report feeling a little bit lightheaded. She  had complaints of left forehead pain and swelling from her fall.  She had  hit her head.  She underwent a CT of her brain in the emergency department  which revealed a large left frontal area hematoma and small subdural  hematoma.  Neurosurgery was notified and it was determined at that time that  she was not in need of evacuation.   HOSPITAL COURSE:  She was admitted.  Again, her course was uneventful.  She  did exhibit some orthostasis initially but this responded to IV fluids an  withholding of her antihypertensive medications.  Her hospital course was  that of stability and improvement, no recurrence of symptoms, no arrhythmia,  no evidence of acute cardiac event.  She was seen by Dr. Gerlene Fee in follow-  up who felt that she was stable for discharge home.  Again, she has  undergone work-up and evaluation for syncope without determined etiology at  this time and currently pending is the 2-D echocardiogram  which can be  followed up once discharged.      Hettie Holstein, D.O.  Electronically Signed     ESS/MEDQ  D:  12/26/2005  T:  12/26/2005  Job:  [id number]   cc:   Lenon Curt. Chilton Si, M.D.  Fax: [phone]   Reinaldo Meeker, M.D.  Fax: [phone]

## 2010-09-02 NOTE — Discharge Summary (Signed)
[place]  Patient:    [name], FOLSON                       MRN: [id number] Adm. Date:  [id number] Disc. Date: [id number] Attending:  Colbert Ewing Dictator:   Dian Situ, P.A. CC:         Clovis Pu. Patty Sermons, M.D.  Robert A. Thurston Hole, M.D.   Discharge Summary  DISCHARGE DIAGNOSES: 1. Status post left total knee replacement. 2. Postoperative anemia. 3. Hypertension. 4. Mild hyponatremia.  HISTORY OF PRESENT ILLNESS:  [name] is an 77 year old female with a history of hypertension, breast cancer, degenerative joint disease of the left knee, who failed conservative therapy, and elected to undergo a left total knee replacement by Dr. Elana Alm. Wainer.  Postoperative she was weightbearing as tolerated, and on Coumadin for deep vein thrombosis prophylaxis.  She received 2 units of packed red blood cells, secondary to a drop in the H&H.  She has had issues of hypokalemia with a potassium at 3.0 today.  She was noted to have some watery diarrhea.  A KUB done which showed unremarkable bowel gas pattern.  The patient has been making unsteady progress.  She is close supervision for transfers.  Ambulated 150 feet needing cues for safety.  PAST MEDICAL HISTORY: 1. Significant hypertension. 2. Depression. 3. Right mastectomy for cancer. 4. Hysterectomy. 5. Degenerative joint disease. 6. Hypercholesterolemia. 7. Frequency and nocturia. 8. Gastroesophageal reflux disease. 9. Constipation.  ALLERGIES:  Question allergy to SULFA.  SOCIAL HISTORY:  The patient lives at [facility].  She was independent prior to admission.  She does not use any tobacco or alcohol.  HOSPITAL COURSE:  [name] was admitted to rehabilitation on June 27, 2000, for inpatient therapies to consist of physical therapy and occupational therapy daily.  After admission the patient has been on some Coumadin for deep vein thrombosis  prophylaxis.  LABORATORY DATA:  On admission showed hyponatremia to be improving with a potassium at 3.3.  She was supplemented for two days total.  A recheck of July 02, 2000, shows a sodium of 133, potassium 3.6, chloride 95, CO2 of 33, BUN 3, creatinine 1.2, glucose 112.  CBC on admission showed a hematocrit of 29.0, hemoglobin 10.9, white count 5.0, platelets 307.  The patients left knee incision has been healing well, without any signs or symptoms of infection or drainage.  No erythema noted.  The patients blood pressure has been well-controlled.  A PT INR at the time of discharge was 18.9 and 2.0 respectively.  The patient is discharged on 5 mg q.d. of Coumadin.  INSTRUCTIONS:  The pro time is to be checked on Friday, and the pharmacy at the nursing home to adjust and follow the Coumadin for two additional weeks. At the time of discharge the patient is minimal assist for bed mobility, supervision for transfers, close supervision for ambulating short distances. Knee flexion is at 112 degrees.  She is at supervision for ADL needs, requiring increased time.  Further follow-up progressive therapies to continue at [facility] after discharge.  DISPOSITION:  On July 04, 2000, the patient is discharged to [facility].  DISCHARGE MEDICATIONS:  1. Coumadin 5 mg q.d. x 2 weeks.  2. Lipitor 10 mg q.h.s.  3. Trinsicon one p.o. b.i.d.  4. Ativan 0.5 mg q.h.s.  5. Os-Cal plus D 500 mg t.i.d.  6. Zoloft 50 mg q.d.  7. Detrol LA 4 mg q.d.  8. _______ 10 mg b.i.d.  9. Lasix 20 mg q.d. 10. Protonix. 11. Nexium 40 mg q.d. 12. Diovan. 13. Hydrochlorothiazide 12.5 mg one p.o. q.d. 14. Norvasc 5 mg q.d. 15. Senna S increased to two p.o. b.i.d. 16. Vicodin one to two p.o. q.4-6h. p.r.n. pain.  ACTIVITIES:  Supervision, use walker.  DIET:  Regular diet.  WOUND CARE:  Please keep the area clean and dry.  SPECIAL INSTRUCTIONS:  Progressive PT and OT to continue after discharge.   The pro time to be checked on Friday, with the Coumadin dose to be adjusted to keep the INR between 2.0 to 3.0 range.  Continue the Coumadin for two additional weeks.  FOLLOWUP:  The patient is to follow up with Dr. Thurston Hole for postoperative check in the next three to four weeks.  Follow up with Dr. Faith Rogue as needed. DD:  07/03/00 TD:  07/03/00 Job: [id number]

## 2010-09-02 NOTE — Op Note (Signed)
NAMESHEROL, SABAS                ACCOUNT NO.:  [id number]   MEDICAL RECORD NO.:  [id number]          PATIENT TYPE:  AMB   LOCATION:  NESC                         FACILITY:  [facility]   PHYSICIAN:  Boston Service, M.D.DATE OF BIRTH:  08/06/1916   DATE OF PROCEDURE:  04/22/2004  DATE OF DISCHARGE:                                 OPERATIVE REPORT   PREOPERATIVE DIAGNOSIS:  Microhematuria, nocturia with daytime frequency.  Cystoscopy shows area of erythematous mucosa adjacent to a bladder  diverticulum.  Plans made for cystoscopy under anesthesia with retrogrades  and bladder biopsy.   OPERATION PERFORMED:   SURGEON:  Boston Service, M.D.   ANESTHESIA:  General.   DRAINS:  20 French Foley.   SPECIMENS:  Bladder biopsy.  Posterior right wall times four.   ESTIMATED BLOOD LOSS:  Minimal.   COMPLICATIONS:  None.   DESCRIPTION OF PROCEDURE:  Patient was prepped and draped with great care in  dorsal lithotomy position after institution of an adequate level of general  anesthesia (LMA).  A well lubricated 21 French panendoscope was gently  inserted at the urethral meatus, normal urethra and sphincter. Normal  trigone and orifices.  Diverticulum right posterior bladder wall was noted  as well as adjacent erythematous mucosa.  No evidence of stone or papillary  lesion within the bladder.  Right and left retrogrades were performed using  a blocking catheter.  Ureters were somewhat tortuous consistent with  patient's age but no evidence of filling defect or obstruction.  Prompt  drainage at three to five minutes on both sides.  Biopsy forceps were then  inserted.  Representative samples were taken of the  erythematous mucosa along the posterior bladder wall.  Once samples had been  obtained, Bugbee electrode was inserted, adequate hemostasis was obtained,  20 [place] Foley catheter was inserted and left to straight drain.  The  patient was then transferred to the recovery room in  satisfactory condition.      RH/MEDQ  D:  04/22/2004  T:  04/22/2004  Job:  [id number]   cc:   Lenon Curt. Chilton Si, M.D.  [address]  [place]  [place] [address]  Fax: [phone]   Gretta Cool, M.D.  [address] [place]  [place] [address]  Fax: [phone]

## 2010-09-02 NOTE — Discharge Summary (Signed)
[name], [name] NO.:  [id number]   MEDICAL RECORD NO.:  [id number]          PATIENT TYPE:  INP   LOCATION:  0465                         FACILITY:  [facility]   PHYSICIAN:  Maxwell Caul, M.D.DATE OF BIRTH:  Mar 05, 1917   DATE OF ADMISSION:  02/20/2004  DATE OF DISCHARGE:  02/24/2004                                 DISCHARGE SUMMARY   HISTORY:  This is an 77 year old woman who lives in the independent part of  [place].  She called for help on her life line on the day of admission  and was brought to the emergency room after sustaining a fall related to  weakness. The patient states she was well until the morning of admission  when she suddenly developed an shaking chill, worsening cough and shortness  of breath.  She had had a one week history of a mild nonproductive cough and  some rhinorrhea. She had not been obviously febrile until the day of  admission. She did receive a flu shot and Pneumovax this fall which would  have been recently. She has been in good health recently although she has  had a workup for urinary frequency by Boston Service, M.D.   REVIEW OF SYMPTOMS:  CARDIAC:  No chest pain. GI:  No nausea, no vomiting,  no dysphagia. She has had a 10 pound weight loss over the past two months.   PAST MEDICAL HISTORY:  1.  History of osteoarthritis status post left total knee replacement.  2.  History of hypertension.  3.  History of breast cancer status post right modified mastectomy in March      2000. She has had a recent left mammogram which was normal.  4.  History of gastroesophageal reflux disease.  5.  History of hysterectomy and appendectomy.  6.  History of overactive bladder.  7.  History of hyperlipidemia.  8.  History of osteoporosis with kyphosis.  9.  History of cholelithiasis.  10. History of anxiety and hypertension.  11. Basal cell carcinoma of the face.   MEDICATIONS:  1.  Diovan.  2.  Hydrochlorothiazide 160/12.5  p.o. q.d.  3.  Norvasc 5 p.o. q.d.  4.  Centrum silver 1 p.o. q.d.  5.  Glucosamine chondroitin q.d.  6.  Lipitor 10 q.d.  7.  Colace 100, 1-2 q.d.  8.  Nexium 40 q.d.  9.  Ecotrin 80, 1 q.d.  10. Zoloft 150 q.d.   SOCIAL HISTORY:  She lives in the independent part of [place]. She has  never smoked and has rarely consumed alcohol.   PHYSICAL EXAMINATION:  VITAL SIGNS:  On admission temperature was 101.8,  blood pressure was 116/58, oxygenation was 95% on 2 liters.  GENERAL:  She did not look to be in obvious distress.  NECK:  Supple without JVD.  LUNGS:  Clear anteriorly but posteriorly in the bases, there were dry  sounding crackles with poor air flow.  CARDIAC:  Heart sounds were normal.  There was no significant murmurs,  gallops or rubs.  ABDOMEN:  Normal.  No masses were noted.  NEUROLOGIC:  Nonfocal.   LABORATORY DATA ON ADMISSION:  A urinalysis was unremarkable. CBC showed a  white count of 15.5 with an absolute granulocyte count of 15.3 and greater  than 20% bands. Hemoglobin was 12.2, sodium was 136, potassium 3.2, chloride  99, CO2 27, BUN 18, creatinine 1.  Hepatocellular enzymes were normal.  ABG  on room air showed a pH of 7.5, pCO2 of 39, PO2 of 51 and bicarb of 29.  A  chest x-ray was notable for kyphosis and for chronic interstitial changes  bilaterally. There was no focal infiltrate.   HOSPITAL COURSE:  This lady was admitted with a classic presentation for  bacterial pneumonia perhaps superimposed on a recent mild viral illness.  In  spite of this, her chest x-ray was reasonably unremarkable.  She went on to  have a CT scan of the chest done because of this which showed small  bilateral pleural effusions, bibasilar atelectasis, bilateral scarring  and/or subsegmental atelectasis.  There was a patchy ground glass appearance  in both upper lobes, right greater than left with a peripheral distribution.  Considerations and differential include an acute  inflammatory process such  as bronchopneumonia.  If this were a more chronic process, other  considerations including bronchiolitis obliterans or eosinophilic  pneumonitis would be a consideration. There was also two 3 mm nodules, one  in the lingula, one in the right middle lobe and mild intralobular septal  thickening in the lung bases either chronic disease or interstitial edema.   On treatment initiated in the emergency room including Rocephin and  azithromycin, she did well. She rapidly defervesced and her white count  returned into the normal range with a normal differential.  Her clinical  situation also improved including improvement in oxygenation.  It was noted  in the hospital that her hemoglobin dropped somewhat to a low of 9.7.  At  discharge, this was 9.8 after having been in the normal range on admission  at 12.2.  Some of this could be hydrational. A rectal exam was done and was  negative for any evidence of bleeding although guaiac stools are pending at  time of this dictation.   It was felt on the day of discharge that she was stable enough for return to  [facility] although she is generally weak and she feels she needs to have  some rehabilitation and I essentially agree with this.  She was started on  Avelox on November 7 and should continue for a seven day course of this on  return to [facility].   FINAL DIAGNOSES:  1.  Bronchopneumonia probably bacterial superimposed on a viral illness. See      discussion above.  2.  Anemia.  This will need to be followed at discharge, the source of this      was never really determined.  She will be discharged on iron therapy and      her proton pump inhibitors will continue.  It is recommended that she      have a CBC done on Friday of this week and that she be followed closely      for this problem.  3.  Urinary frequency.  This is a problem currently being worked up by Dr.     Wanda Plump.  She has a cystoscopy and we  will need to reaffirm when      followup with him in his office is required.   DISCHARGE MEDICATIONS:  1.  Diovan/hydrochlorothiazide 160/12.5, 1 p.o.  q.d.  2.  Norvasc 5 p.o. q.d.  3.  Glucosamine chondroitin daily.  4.  Lipitor 10 q.d.  5.  Nexium 40 q.d.  6.  Zoloft 150 q.d.  7.  Avelox 400 mg p.o. q.d. to continue for a further five days after return      to the facility.  8.  Ferrous sulfate 325, 1 p.o. b.i.d.   Note that her aspirin is currently on hold because of the anemia but can be  reinstituted once this is felt to be stabilized.   OTHER DISCHARGE INSTRUCTIONS:  She will need physical and occupational  therapy in order to regain her strength and stability before she can return  to her independent apartment setting.      MGR/MEDQ  D:  02/24/2004  T:  02/24/2004  Job:  [id number]

## 2010-09-02 NOTE — Discharge Summary (Signed)
[place]. [facility]  Patient:    [name], [name]                       MRN: [id number] Adm. Date:  [id number] Disc. Date: [id number] Attending:  Colbert Ewing Dictator:   Dian Situ, PA                           Discharge Summary  NO DICTATION DD:  07/03/00 TD:  07/04/00 Job: [id number]

## 2010-09-02 NOTE — Op Note (Signed)
NAMEBRIN, RUGGERIO                ACCOUNT NO.:  [id number]   MEDICAL RECORD NO.:  [id number]          PATIENT TYPE:  AMB   LOCATION:  NESC                         FACILITY:  [facility]   PHYSICIAN:  Martina Sinner, MD DATE OF BIRTH:  03/19/1917   DATE OF PROCEDURE:  10/31/2005  DATE OF DISCHARGE:  10/31/2005                                 OPERATIVE REPORT   PREOPERATIVE DIAGNOSIS:  Muscle spasm, neurogenic bladder, urge  incontinence.   POSTOPERATIVE DIAGNOSIS:  Muscle spasm, neurogenic bladder, urge  incontinence.   SURGERY:  Cystoscopy, hydrodistention, Botox injection therapy.   Ms. Sariah Henkin has muscle spasms with neurogenic bladder and urge  incontinence.  She is refractory to aggressive medical and behavioral  therapy.   The patient was prepped and draped in usual fashion.  I used a 24-French  injection scope for examination.  Bladder mucosa and trigone were normal.  There was no stitch, foreign body or carcinoma.  The bladder was  hydrodistended to 550 mL.  There is no evidence of interstitial cystitis on  re-examination   The patient was injected with 100 units of Botox in 20 sites.  I used a 0.5  mL per injection site.  I spared the trigone.  I injected cephalad to the  interureteric ridge and along 5 and 7 o'clock.   The procedure was well tolerated.  The patient was sent to recovery room.           ______________________________  Martina Sinner, MD  Electronically Signed     SAM/MEDQ  D:  11/22/2005  T:  11/22/2005  Job:  [id number]

## 2010-09-02 NOTE — H&P (Signed)
[name], [name]                ACCOUNT NO.:  [id number]   MEDICAL RECORD NO.:  [id number]          PATIENT TYPE:  INP   LOCATION:  1413                         FACILITY:  [facility]   PHYSICIAN:  Della Goo, M.D. DATE OF BIRTH:  08/19/1916   DATE OF ADMISSION:  12/22/2005  DATE OF DISCHARGE:                                HISTORY & PHYSICAL   CHIEF COMPLAINT:  Fall.   HISTORY OF PRESENT ILLNESS:  This is an 77 year old female resident of  [facility], who suffered a fall after passing out while walking  in the court yard at the facility, shortly after her evening meal. The  patient denies having any chest pain or shortness of breath associated with  the episode. Denies also having any nausea, vomiting, diarrhea, fevers, or  chills. She denies having any loss of balance associated with this episode  either. She does report feeling a little bit light headed currently. She  also has complaints of left forehead area pain and swelling from her fall,  where she fell and hit her head. The patient denies having any previous  episodes like this. The patient was evaluated in the emergency department  and underwent a CT scan of the brain, which revealed a large left frontal  area hematoma and a very small subdural hematoma, in which the emergency  room physician discussed with neurosurgery on call and basically, this was  felt to be small and insignificant at this time. However, patient should be  monitored. Neurosurgery did not feel this was large enough to need  evacuation.   PAST MEDICAL HISTORY:  The patient has a history of breast cancer  approximately 20 years ago, status post mastectomy of the right breast,  hypertension, gastroesophageal reflux disease, hyperlipidemia   PAST SURGICAL HISTORY:  History of left knee replacement, status post  hysterectomy, status post right mastectomy, status post appendectomy, and  status post bladder surgery and Botox injections of  the bladder for bladder  spasms with lack of resolve of symptoms from the Botox therapy.   ALLERGIES:  SULFA (?)   SOCIAL HISTORY:  The patient is widowed. Lives at the [facility].  Previously was a Arts development officer. No history of alcohol or tobacco usage.   FAMILY HISTORY:  Mother deceased of cardiac disease, age 18. Father died of  pneumonia at age 75.   REVIEW OF SYSTEMS:  Pertinent's mentioned above.   PHYSICAL EXAMINATION:  GENERAL:  Thin, well developed, 77 year old female in  mild discomfort but no acute distress currently.  VITAL SIGNS:  Temperature 98.0, blood pressure 145/66, heart rate 80,  respiratory rate 20. O2 saturation 98%.  HEENT:  Normocephalic. Positive large left frontal area hematoma, with  purplish black hue and a small superficial abrasion. Also, annular incision  approximately 0.5 cm diameter on the right lateral supramandibular area,  following a biopsy and excision of a skin cancer. Oropharynx clear. No  exudates. No tongue lacerations. Tympanic membranes clear. No hemotympanum.  NECK:  Supple. No adenopathy. Questionable carotid bruit in the right  carotid area. No thyromegaly.  CARDIOVASCULAR:  Regular rate  and rhythm. NO murmur, rub, or gallop.  LUNGS:  Clear to auscultation bilaterally.  CHEST:  Wall area, right mastectomy. No unusual masses over chest wall in  this area.  BREAST:  Left, no unusual masses. No nipple discharge on examination and no  axillary adenopathy.      Della Goo, M.D.  Electronically Signed     HJ/MEDQ  D:  12/23/2005  T:  12/23/2005  Job:  [id number]

## 2011-01-06 ENCOUNTER — Other Ambulatory Visit: Payer: Self-pay | Admitting: Dermatology

## 2011-04-18 DIAGNOSIS — N390 Urinary tract infection, site not specified: Secondary | ICD-10-CM

## 2011-04-18 DIAGNOSIS — K644 Residual hemorrhoidal skin tags: Secondary | ICD-10-CM

## 2011-04-18 DIAGNOSIS — S2249XA Multiple fractures of ribs, unspecified side, initial encounter for closed fracture: Secondary | ICD-10-CM

## 2011-04-18 HISTORY — DX: Urinary tract infection, site not specified: N39.0

## 2011-04-18 HISTORY — DX: Residual hemorrhoidal skin tags: K64.4

## 2011-04-18 HISTORY — DX: Multiple fractures of ribs, unspecified side, initial encounter for closed fracture: S22.49XA

## 2011-09-06 ENCOUNTER — Ambulatory Visit (HOSPITAL_COMMUNITY)
Admission: RE | Admit: 2011-09-06 | Discharge: 2011-09-06 | Disposition: A | Discharge status: Home / Self Care | Payer: Medicare Other | Source: Ambulatory Visit | Attending: Internal Medicine | Admitting: Internal Medicine

## 2011-09-06 DIAGNOSIS — R1310 Dysphagia, unspecified: Secondary | ICD-10-CM | POA: Insufficient documentation

## 2011-09-06 NOTE — Procedures (Signed)
Objective Swallowing Evaluation: Modified Barium Swallowing Study  Patient Details  Name: [name] MRN: [id number] Date of Birth: Aug 20, 1916  Today's Date: 09/06/2011 Time: 1308-6578 SLP Time Calculation (min): 35 min  Past Medical History: No past medical history on file. Past Surgical History: No past surgical history on file. HPI:  78 yo female referred by SNF SLP for MBS due to concerns pt may be aspirating and pt symptoms of sensation of food lodging in pharynx, increased belching.  Pt recently admitted to [facility] after suffering a fall with rib fx.  PMH + for SDH in 2007 s/p fall, compression fx, OEA, total knee replacement, HTN, breast cancer,.  Per SNF documentation, pt also with h/o mastoiditis, salivary gland dyfunction, esophagitis.  Pt currently takes a PPI and complained of frequent belching with liquid intake, appears mildly dysphonic today, which she and her daughter states has been present for a "long time".   Also noted, pt has taken Boniva in the past per d/c summary from 2007.     Assessment / Plan / Recommendation Clinical Impression  Dysphagia Diagnosis: Suspected primary esophageal dysphagia Clinical impression: Pt with only minimal oropharyngeal dysphagia without aspiration or penetration of any consistency tested.   Based on pt's symptoms and radiographic findings suspect primary esophageal deficits.   Minimal oropharyngeal residuals noted that delayed reflexive swallows cleared.  At times pharyngeal swallow was delayed (from liquid oral residuals spilling into pharynx) to pyriform sinus.  Suspect minimal oropharyngeal issues are compensatory for suspected primary esophageal difficutlies.    Regarding esophagus:  Pt appeared with suspected poor motility with thicker consistencies WITHOUT sensation.  Pudding stasis appeared throughout entire esophagus, liquid swallows appeared to aid clearance but with appearance of tertiary contractions.  Radiologist not present  to confirm findings.  Pt's kyphosis likely contributes to decr clearance.  Suspect pt's primary aspiration risk is from suspected esophageal issues.  Pt belched during MBS evaluation x2 and after x2.   Recommend pt consume plenty of liquids during meals and consume several small meals/day as able.      Treatment Recommendation       Diet Recommendation Dysphagia 3 (Mechanical Soft);Thin liquid (extra gravy/sauce, several small meals if able)   Other  Recommendations Consider esophageal assessment   Follow Up Recommendations  Skilled Nursing facility    Frequency and Duration   defer to primary slp     Pertinent Vitals/Pain On room air, appears comfortable, no c/o pain   SLP Swallow Goals     General Date of Onset: 09/06/11 HPI: 78 yo female referred by SNF SLP for MBS due to concerns pt may be aspirating and pt symptoms of sensation of food lodging in pharynx, increased belching.  Pt recently admitted to [facility] after suffering a fall with rib fx.  PMH + for SDH in 2007 s/p fall, compression fx, OEA, total knee replacement, HTN, breast cancer,.  Per SNF documentation, pt also with h/o mastoiditis, salivary gland dyfunction, esophagitis.  Pt currently takes a PPI and complained of frequent belching with liquid intake, appears mildly dysphonic today, which she and her daughter states has been present for a "long time".   Also noted, pt has taken Boniva in the past per d/c summary from 2007. Type of Study: Modified Barium Swallowing Study Previous Swallow Assessment: No previous xray or endoscopic evaluations per pt and family.  Diet Prior to this Study: Dysphagia 3 (soft);Thin liquids Temperature Spikes Noted: No Respiratory Status: Room air History of Intubation: No Behavior/Cognition: Alert;Cooperative Oral Cavity -  Dentition: Adequate natural dentition (oral cavity with erythema) Vision: Functional for self-feeding Patient Positioning: Postural control interferes with function (pt  is kyphotic) Baseline Vocal Quality: Other (comment) (dysphonic) Volitional Cough: Other (Comment) (DNT d/t broken ribs) Volitional Swallow: Able to elicit Anatomy: Within functional limits Pharyngeal Secretions: Not observed secondary MBS    Reason for Referral Concern for aspiration  Oral Phase   Oral phase:  Minimal impaired  Pharyngeal Phase Pharyngeal Phase: Impaired   Cervical Esophageal Phase Cervical Esophageal Phase: Impaired    Chales Abrahams 09/06/2011, 12:09 PM

## 2012-04-18 ENCOUNTER — Other Ambulatory Visit: Payer: Self-pay | Admitting: Dermatology

## 2012-07-11 ENCOUNTER — Non-Acute Institutional Stay (SKILLED_NURSING_FACILITY): Payer: Medicare Other | Admitting: Geriatric Medicine

## 2012-07-11 DIAGNOSIS — J309 Allergic rhinitis, unspecified: Secondary | ICD-10-CM

## 2012-07-11 DIAGNOSIS — Z66 Do not resuscitate: Secondary | ICD-10-CM

## 2012-07-23 ENCOUNTER — Encounter: Payer: Self-pay | Admitting: Geriatric Medicine

## 2012-07-23 DIAGNOSIS — J309 Allergic rhinitis, unspecified: Secondary | ICD-10-CM

## 2012-07-23 HISTORY — DX: Allergic rhinitis, unspecified: J30.9

## 2012-07-23 NOTE — Assessment & Plan Note (Signed)
Patient with clear nasal drainage worsening over the last few days to weeks. Treat with cetirizine antihistamine, monitor for symptomatic improvement

## 2012-07-23 NOTE — Progress Notes (Signed)
Patient ID: [name], female   DOB: 1917-02-21, 79 y.o.   MRN: [id number]  Chief Complaint  Patient presents with  . sinus drainage   HPI  This 79 year old female resident of [facility] skilled nursing section was seen today to evaluate persistent "extra drainage". She reports runny nose and cough, denies shortness of breath or dyspnea on exertion. Patient currently receives nasal saline nasal spray daily, teres tissue throughout the day because of nasal drainage.    Allergies Reviewed   Medications Reviewed    Review of Systems  DATA OBTAINED: from patient,  GENERAL: Feels well. No fevers, fatigue, change in activity status. No change in appetite, weight or sleep. RESPIRATORY: SEE HPI  CARDIAC: No chest pain, palpitations. No edema. GI: No abdominal pain. No N/V/D or constipation. No heartburn or reflux.  NEUROLOGIC: No dizziness, fainting, headache, i No change in mental status.  PSYCHIATRIC: No anxiety, depression, behavior issue  Physical Exam  GENERAL APPEARANCE: No acute distress, appropriately groomed, normal body habitus. Alert, pleasant, conversant. HEAD: Normocephalic, atraumatic EYES: Conjunctiva/lids clear.  NOSE: Nasal mucosa moist, pink, nonedematous, not boggy MOUTH: Minimal nonpurulent oropharyngeal drainage, no cobblestone appearance. NECK: No adenopathy, thyromegaly. RESPIRATORY: Breathing is even, unlabored. Lung sounds are clear and full.  CARDIOVASCULAR: Heart RRR. No murmur or extra heart sounds PSYCHIATRIC: Mood and affect appropriate to situation.    ASSESSMENT/PLAN  Allergic rhinitis Patient with clear nasal drainage worsening over the last few days to weeks. Treat with cetirizine antihistamine, monitor for symptomatic improvement   Follow up: Routine or as needed  [name] Bargo T.Asako Saliba, NP-C  [name] Dust, NP-student 07/11/2012

## 2012-08-15 ENCOUNTER — Non-Acute Institutional Stay (SKILLED_NURSING_FACILITY): Payer: Medicare Other | Admitting: Geriatric Medicine

## 2012-08-15 ENCOUNTER — Encounter: Payer: Self-pay | Admitting: Geriatric Medicine

## 2012-08-15 DIAGNOSIS — M170 Bilateral primary osteoarthritis of knee: Secondary | ICD-10-CM | POA: Insufficient documentation

## 2012-08-15 DIAGNOSIS — K2289 Other specified disease of esophagus: Secondary | ICD-10-CM | POA: Insufficient documentation

## 2012-08-15 DIAGNOSIS — K228 Other specified diseases of esophagus: Secondary | ICD-10-CM

## 2012-08-15 DIAGNOSIS — N318 Other neuromuscular dysfunction of bladder: Secondary | ICD-10-CM | POA: Insufficient documentation

## 2012-08-15 DIAGNOSIS — R32 Unspecified urinary incontinence: Secondary | ICD-10-CM | POA: Insufficient documentation

## 2012-08-15 DIAGNOSIS — I10 Essential (primary) hypertension: Secondary | ICD-10-CM | POA: Insufficient documentation

## 2012-08-15 NOTE — Assessment & Plan Note (Signed)
Patient with history of esophageal dysmotility, now with some increased coughing, and 2 episodes of choking in the last month. Agree with downgrading diet mechanical soft, will observe. May benefit from repeat GI evaluation, repeat dilatation

## 2012-08-15 NOTE — Progress Notes (Signed)
Patient ID: [name], female   DOB: [birth year], 79 y.o.   MRN: [id number] Chief Complaint  Patient presents with  . F/u choking episode   HPI: This 79 year old female resident [facility] [place] [facility] nursing section, had a choking episode over the weekend. Patient was eating bacon and eggs, started coughing and choking. She was able to clear her airway her self. She did not spelled any respiratory distress. Patient has not developed any sign of respiratory illness this week. His choking episodes the second in the last several weeks. Patient has a history of esophageal motility problem. She is status post esophageal dilatation 10/2011. Patient has been evaluated by speech therapy in the facility, and has recommended downgrading diet to mechanical soft. Patient does not recall having any real choking problem however she does admit that she is coughing more than usual, does have to cough up a fair amount of sputum. This has been going on for some time, she does receive daily antihistamine to try and reduce the secretions.  Allergies  Allergies  Allergen Reactions  . Sulfa Antibiotics    Medications Reviewed  Data Reviewed    Radiology:   [id number], external    02/20/12 Glu 92, BUn 32, Cr. 1.06, Na 139, K+ 4.2, Protein/ LFTS WNL TC 208, TG 109, HDL 43, LDL 145 06/18/2012: WBC 4.1, hemoglobin 13.3, hematocrit 38.3, platelet 248  Glucose 103, BUN 24, creatinine 0.96, sodium 134, potassium 4.3. LFTs/proteins WNL.  TSH 1.99  Review of Systems  DATA OBTAINED: from patient, nurse, medical record, . GENERAL: Feels well. No fevers, fatigue, change in activity status. No change in appetite, weight  SKIN: No itch, rash or open wounds EYES: No eye pain, dryness or itching. No change in vision EARS: No earache, tinnitus, change in hearing NOSE: No congestion, drainage or bleeding MOUTH/THROAT: No mouth or tooth pain. No difficulty chewing or swallowing.Recent choking episode,  increased sputum RESPIRATORY: No wheezing, SOB. Increased  Coughing, increased sputum (clear) CARDIAC: No chest pain, palpitations. No edema.  GI: No abdominal pain. No N/V/D or constipation. No heartburn or reflux GU: Chronic urinary frequency/ incontinence. No dysuria.. No change in urine volume or character NEUROLOGIC: No dizziness, fainting, headache, No change in mental status.  PSYCHIATRIC: No anxiety, depression, behavior issue. Sleeps well.   Physical Exam Filed Vitals:   08/15/12 1434  BP: 163/70  Pulse: 75  Temp: 98 F (36.7 C)  TempSrc: Oral  Resp: 24  Weight: 169 lb 1.6 oz (76.703 kg)   Filed Weights   08/15/12 1434  Weight: 169 lb 1.6 oz (76.703 kg)   GENERAL APPEARANCE: No acute distress, appropriately groomed, normal body habitus. Alert, pleasant, conversant. HEAD: Normocephalic, atraumatic EYES: Conjunctiva/lids clear. Marland Kitchen  NOSE: No deformity or discharge. MOUTH/THROAT: Lips w/o lesions. Oral mucosa, tongue moist, w/o lesion. Oropharynx w/o redness or lesions.  NECK: Supple, full ROM. No thyroid tenderness, enlargement or nodule LYMPHATICS: No head, neck or supraclavicular adenopathy RESPIRATORY: Breathing is even, unlabored. Lung sounds are clear and full.  CARDIOVASCULAR: Heart RRR. No murmur or extra heart sounds  EDEMA: No peripheral edema.  NEUROLOGIC: Oriented to time, place, person. Speech clear, no tremor.  PSYCHIATRIC: Mood and affect appropriate to situation   ASSESSMENT/PLAN  Other specified disorder of the esophagus Patient with history of esophageal dysmotility, now with some increased coughing, and 2 episodes of choking in the last month. Agree with downgrading diet mechanical soft, will observe. May benefit from repeat GI evaluation, repeat dilatation   Follow up:  Routine and as needed  Yves Fodor T.Creek Gan, NP-C 08/15/2012

## 2012-10-21 ENCOUNTER — Encounter: Payer: Self-pay | Admitting: Geriatric Medicine

## 2012-10-21 ENCOUNTER — Non-Acute Institutional Stay (SKILLED_NURSING_FACILITY): Payer: Medicare Other | Admitting: Geriatric Medicine

## 2012-10-21 DIAGNOSIS — M25511 Pain in right shoulder: Secondary | ICD-10-CM

## 2012-10-21 DIAGNOSIS — K228 Other specified diseases of esophagus: Secondary | ICD-10-CM

## 2012-10-21 DIAGNOSIS — I10 Essential (primary) hypertension: Secondary | ICD-10-CM

## 2012-10-21 DIAGNOSIS — M25519 Pain in unspecified shoulder: Secondary | ICD-10-CM

## 2012-10-21 NOTE — Assessment & Plan Note (Signed)
No further choking episodes. The patient's current diet is MechSoft, thin liquids, encourage warm liquids after meals. Resident/ family signed a wavier to allow bacon and fried chicken on the bone. P.o. intake has been adequate, weight stable. Patient remains overweight.

## 2012-10-21 NOTE — Assessment & Plan Note (Signed)
Right shoulder girdle muscle pain with movement. Anterior posterior shoulder muscles are tight and tender as are axillary muscles. No redness swelling or masses appreciated axilla. Likely is muscle tightness, possibly spasms due to stretching reaching or overuse. Will obtain x-ray to rule out acute injury or other change. Will ask for OT evaluation and treatment with muscle relaxing modalities

## 2012-10-21 NOTE — Assessment & Plan Note (Signed)
BP readings stable, most recent lab satisfactory. Continue current medications, update lab at intervals.

## 2012-10-21 NOTE — Progress Notes (Signed)
Patient ID: [name], female   DOB: 1917/01/26, 79 y.o.   MRN: [id number] [facility] [phone])  Chief Complaint  Patient presents with  . Rt. arm ppain  . Dysphagia    HPI: This is a 79 y.o. female resident [facility].  Evaluation is requested today due to Rt. Arm pain. Has been experiencing pain under rt. axilla for last few days- no specific injury. She does report reaching for tissues often.   Diet was downgraded last month mechanical soft due to recurrent coughing. Patient has had no difficulty chewing or swallowing has been tolerating diet adequately. Patient's family did request a waiver for bacon and chicken to be included in her diet.    Allergies  Allergen Reactions  . Sulfa Antibiotics    Medications Reviewed  DATA REVIEWED  Laboratory Studies: [facility], external 02/20/12 Glu 92, BUn 32, Cr. 1.06, Na 139, K+ 4.2, Protein/ LFTS WNL   TC 208, TG 109, HDL 43, LDL 145  06/18/2012: WBC 4.1, hemoglobin 13.3, hematocrit 38.3, platelet 248   Glucose 103, BUN 24, creatinine 0.96, sodium 134, potassium 4.3. LFTs/proteins WNL.   TSH 1.99  Review of Systems  DATA OBTAINED: from patient, nurse, medical record,  GENERAL: Feels well   No fevers, fatigue, change in appetite or weight SKIN: No itch, rash or open wounds MOUTH/THROAT: No mouth or tooth pain  No sore throat No difficulty chewing or swallowing RESPIRATORY: No cough, wheezing, SOB CARDIAC: No chest pain, palpitations  No edema. GI: No abdominal pain  No N/V/D or constipation  No heartburn or reflux  GU: No dysuria. Chronic  Frequency, unchanged  No change in urine volume or character  MUSCULOSKELETAL:   'achiness all over " today. See HPI No recent falls.  NEUROLOGIC: No dizziness, fainting, headache  No change in mental status.  PSYCHIATRIC: No feelings of anxiety, depression Sleeps well.  No behavior issue.   Physical Exam Filed Vitals:   10/21/12 1148  BP: 147/73  Pulse: 65  Temp: 97.5 F (36.4 C)  Resp: 20  Weight: 173 lb 8 oz (78.699 kg)   GENERAL APPEARANCE: No acute distress, appropriately groomed, Overweight body habitus. Alert, pleasant, conversant. HEAD: Normocephalic, atraumatic EYES: Conjunctiva/lids clear. Pupils round, reactive. EARS: Decreased Hearing, not new. NOSE: No deformity or discharge. MOUTH/THROAT: Lips w/o lesions. Oral mucosa, tongue moist, w/o lesion. Oropharynx w/o redness or lesions.  NECK: Supple, full ROM. No thyroid tenderness, enlargement or nodule LYMPHATICS: No head, neck or supraclavicular adenopathy RESPIRATORY: Breathing is even, unlabored. Lung sounds are clear and full.  CARDIOVASCULAR: Heart RRR. No murmur or extra heart sounds  EDEMA: No peripheral or periorbital edema.  GASTROINTESTINAL: Abdomen is soft, non-tender, not distended w/ normal bowel sounds.  MUSCULOSKELETAL: Back with significant kyphosis, No scoliosis or spinal process tenderness.    Tender/ tight muscles rt. Anterior/posterior shoulder, deltoid. No joint tenderness NEUROLOGIC: Oriented to time, place, person. Speech clear, no tremor.  PSYCHIATRIC: Mood and affect appropriate to situation  ASSESSMENT/PLAN  Pain in joint, shoulder region Right shoulder girdle muscle pain with movement. Anterior posterior shoulder muscles are tight and tender as are axillary muscles. No redness swelling or masses appreciated axilla. Likely is muscle tightness, possibly spasms due to stretching reaching or overuse. Will obtain x-ray to rule out acute injury or other change. Will ask for OT evaluation and treatment with muscle relaxing modalities  Other specified disorder of the esophagus No further choking episodes. The patient's current diet is  MechSoft, thin liquids, encourage warm liquids after meals. Resident/ family signed a wavier to allow bacon and fried chicken on the bone. P.o. intake has been adequate, weight stable. Patient  remains overweight.  Unspecified essential hypertension BP readings stable, most recent lab satisfactory. Continue current medications, update lab at intervals.    Follow up: Routine or as needed  Shonte Beutler T.Marlow Berenguer, NP-C 10/21/2012

## 2013-01-06 ENCOUNTER — Encounter: Payer: Self-pay | Admitting: Geriatric Medicine

## 2013-01-06 ENCOUNTER — Non-Acute Institutional Stay (SKILLED_NURSING_FACILITY): Payer: Medicare Other | Admitting: Geriatric Medicine

## 2013-01-06 DIAGNOSIS — J189 Pneumonia, unspecified organism: Secondary | ICD-10-CM | POA: Insufficient documentation

## 2013-01-06 DIAGNOSIS — F329 Major depressive disorder, single episode, unspecified: Secondary | ICD-10-CM

## 2013-01-06 LAB — CBC AND DIFFERENTIAL
Hemoglobin: 11.3 g/dL — AB (ref 12.0–16.0)
Platelets: 355 10*3/uL (ref 150–399)
WBC: 8.7 10^3/mL

## 2013-01-06 LAB — TSH: TSH: 0.76 u[IU]/mL (ref 0.41–5.90)

## 2013-01-06 LAB — BASIC METABOLIC PANEL: Potassium: 3.7 mmol/L (ref 3.4–5.3)

## 2013-01-06 NOTE — Progress Notes (Signed)
Patient ID: [name], female   DOB: [birth year], 79 y.o.   MRN: [id number] [facility] [phone])  Code Status: Living Will, DNR Contact Information   Name Relation Home Work [place]  [phone]     Pamelia Hoit  [phone]        Chief Complaint  Patient presents with  . F/U respiratory illness    HPI: This 80 y.o. female resident of [facility] Sk[facility] section is evaluated today in followup of a respiratory illness. Patient was noted with a dry cough on September 12, by the next day it was a productive cough. Chest x-ray was suggestive of a right lower lobe infiltrate. A 3 day course of azithromycin was prescribed along with Mucinex DM. The chest x-ray also showed evidence of old rib fractures; patient was complaining of some chest wall discomfort, tramadol prn was prescribed, patient has used this one time. No further complaint of chest wall pain. Patient's cough continues to be productive, sputum is now clear/yellow. Nurse reports the patient continues to be less active than usual, more tearful and a bit more anxious.  Patient's daughter has expressed concern about increased anxiety/depression symptoms prior to onset of acute illness.     Allergies  Allergen Reactions  . Sulfa Antibiotics    Medications reviewed  DATA REVIEWED  Radiologic Exams  [facility] x-ray 12/28/2012 chest x-ray suggestive right lower lobe infiltrate. Evidence of old left-sided rib fractures  Laboratory Studies  [facility], external  02/20/12 Glu 92, BUn 32, Cr. 1.06, Na 139, K+ 4.2, Protein/ LFTS WNL  TC 208, TG 109, HDL 43, LDL 145  06/18/2012: WBC 4.1, hemoglobin 13.3, hematocrit 38.3, platelet 248  Glucose 103, BUN 24, creatinine 0.96, sodium 134, potassium 4.3. LFTs/proteins WNL.  TSH 1.99      Review of Systems  DATA OBTAINED: from patient, nurse, medical record GENERAL: Doesn't feel very well.   No fever. Mild fatigue,  decereased appetite. SKIN: No itch, rash or open wounds EYES: No eye pain, dryness or itching  No change in vision EARS: No earache, tinnitus, change in hearing NOSE: No congestion, drainage or bleeding MOUTH/THROAT: No mouth or tooth pain  No sore throat No difficulty chewing or swallowing RESPIRATORY: Cough present, productive CARDIAC: No chest pain, palpitations  No edema. GI: No abdominal pain  No N/V/D or constipation  No heartburn or reflux  NEUROLOGIC: No dizziness, fainting, headache,  No change in mental status.  PSYCHIATRIC: Increased signs of anxiety, depression "I just get so lonely....". Sleeps well.  No behavior issue.    Physical Exam Filed Vitals:   01/06/13 1041  BP: 132/66  Pulse: 70  Temp: 97.2 F (36.2 C)  Resp: 18  SpO2: 94%   GENERAL APPEARANCE: No acute distress, appropriately groomed, normal body habitus. Alert, pleasant, conversant. SKIN: No diaphoresis, rash, unusual lesions, wounds HEAD: Normocephalic, atraumatic EYES: Conjunctiva/lids clear.  EARS:  Hearing decreased (not new). NOSE: No deformity or discharge. MOUTH/THROAT: Lips w/o lesions. Oral mucosa, tongue moist, w/o lesion. Oropharynx w/o redness or lesions.  NECK: Supple, full ROM. No thyroid tenderness, enlargement or nodule LYMPHATICS: No head, neck or supraclavicular adenopathy RESPIRATORY: Breathing is even, unlabored. Lungs with rhonchi bilaterally and wheeze mid lung bilaterally  CARDIOVASCULAR: Heart RRR. No murmur or extra heart sounds  EDEMA: No peripheral or periorbital edema. No ascites GASTROINTESTINAL: Abdomen is soft, non-tender, not distended w/ normal bowel sounds. MUSCULOSKELETAL: Back with significant kyphosis, No scoliosis or spinal process tenderness. Tender/ tight  muscles rt. Anterior/posterior shoulder, deltoid. No joint tenderness NEUROLOGIC: Oriented to time, place, person. Speech clear, no tremor. PSYCHIATRIC: Mood and affect appropriate to situation     ASSESSMENT/PLAN  Pneumonia Productive cough and chest x-ray suggestive of right lower lobe infiltrate treated with azithromycin. Patient continues with bilateral rhonchi and  now wheezing in addition. Will add diuretic for 3 days, continue to encourage pulmonary toilet. Obtain lab and recheck chest x-ray  Depressive disorder, not elsewhere classified Reports of increased depression and anxiety from family and staff members. Patient continues with Paxil, she's taken this medication for a long time with successful control of depressive symptoms.  Will ask psychiatric consultant, Dr. Donell Beers to evaluate when he is here next week   Follow up:  Routine or as needed  Lab CBC, BMP, TSH. CXR  Hershal Eriksson T.Derionna Salvador, NP-C 01/06/2013

## 2013-01-07 DIAGNOSIS — F329 Major depressive disorder, single episode, unspecified: Secondary | ICD-10-CM | POA: Insufficient documentation

## 2013-01-07 DIAGNOSIS — F411 Generalized anxiety disorder: Secondary | ICD-10-CM | POA: Insufficient documentation

## 2013-01-07 NOTE — Assessment & Plan Note (Signed)
Reports of increased depression and anxiety from family and staff members. Patient continues with Paxil, she's taken this medication for a long time with successful control of depressive symptoms.  Will ask psychiatric consultant, Dr. Donell Beers to evaluate when he is here next week

## 2013-01-07 NOTE — Assessment & Plan Note (Signed)
Productive cough and chest x-ray suggestive of right lower lobe infiltrate treated with azithromycin. Patient continues with bilateral rhonchi and  now wheezing in addition. Will add diuretic for 3 days, continue to encourage pulmonary toilet. Obtain lab and recheck chest x-ray

## 2013-03-20 ENCOUNTER — Other Ambulatory Visit: Payer: Self-pay | Admitting: Dermatology

## 2013-03-20 DIAGNOSIS — C44310 Basal cell carcinoma of skin of unspecified parts of face: Secondary | ICD-10-CM | POA: Insufficient documentation

## 2013-03-20 HISTORY — DX: Basal cell carcinoma of skin of unspecified parts of face: C44.310

## 2013-04-04 ENCOUNTER — Non-Acute Institutional Stay (SKILLED_NURSING_FACILITY): Payer: Medicare Other | Admitting: Geriatric Medicine

## 2013-04-04 ENCOUNTER — Encounter: Payer: Self-pay | Admitting: Geriatric Medicine

## 2013-04-04 DIAGNOSIS — I10 Essential (primary) hypertension: Secondary | ICD-10-CM

## 2013-04-04 DIAGNOSIS — K228 Other specified diseases of esophagus: Secondary | ICD-10-CM

## 2013-04-04 DIAGNOSIS — F329 Major depressive disorder, single episode, unspecified: Secondary | ICD-10-CM

## 2013-04-04 DIAGNOSIS — C44300 Unspecified malignant neoplasm of skin of unspecified part of face: Secondary | ICD-10-CM

## 2013-04-04 DIAGNOSIS — F411 Generalized anxiety disorder: Secondary | ICD-10-CM

## 2013-04-04 NOTE — Assessment & Plan Note (Signed)
Excessive secretions may be related to esophageal dysmotility, will change antihistamine to BID dosing. i fno imptovement add PPI.

## 2013-04-04 NOTE — Assessment & Plan Note (Signed)
Stable, minimal use of prn lorazepam

## 2013-04-04 NOTE — Assessment & Plan Note (Addendum)
Multiple lesions bx/ cryosurgery 03/20/2013. Pathology returned positive for basal cell cancer on specimens forehead, frontal scalp and rt. Angle of mouth. Patient is scheduled for MOHS procedure in January. BX sites are healing well

## 2013-04-04 NOTE — Progress Notes (Signed)
Patient ID: [name], female   DOB: 11-22-1916, 79 y.o.   MRN: [id number]  [name] [phone])  Code Status: Living Will, DNR  Contact Information   Name Relation Home Work [place]  [phone]     [name]  [phone]        Chief Complaint  Patient presents with  . Medical Managment of Chronic Issues    HPI: This is a 79 y.o. female resident of [name] evaluated today for management of ongoing medical issues.    Last visit: Pneumonia Productive cough and chest x-ray suggestive of right lower lobe infiltrate treated with azithromycin. Patient continues with bilateral rhonchi and  now wheezing in addition. Will add diuretic for 3 days, continue to encourage pulmonary toilet. Obtain lab and recheck chest x-ray  Depressive disorder, not elsewhere classified Reports of increased depression and anxiety from family and staff members. Patient continues with Paxil, she's taken this medication for a long time with successful control of depressive symptoms.  Will ask psychiatric consultant, Dr. Donell [name] to evaluate when he is here next week  Since last visit patient's cough cleared. Repeat chest x-ray did show evidence of some pulmonary edema, she received diuretic for 3 days. Due to esophageal dysmotility this patient's current diet is mechanical soft with thin liquids. She is complaining of increased secretions in the back of her throat. She is noted to be clearing her throat newly constantly. Dr. Donell [name] evaluated this patient,  added lorazepam p.r.n. Patient has been requiring this medicine occasionally, last was December 16, this is the only dose she received this month. In general, patient remains friendly, social with other residents on the unit. He states in activities of choice. Family remains involved. She does have periods of increased anxiety. On December 4 , this patient underwent  cryosurgery for multiple lesions on her face. Pathology returned as basal cell carcinoma. She is scheduled for further treatment with Mohs procedure scheduled January 27 with Dr. Irene [name] Most recent MDS was 03/28/2013. Basic assessments revealed her to have mild cognitive impairment with the BIMS score of 10 /15, no signs of depression (PHQ-9 0/27). Functional status review indicates she requires extensive assistance with bed mobility, transfers, toileting, hygiene and walking. Limited assistance with dressing. She requires supervision with meals. Patient is frequently incontinent of urine, remoains continent of bowel.   Allergies  Allergen Reactions  . Sulfa Antibiotics    Medications reviewed  DATA REVIEWED  Radiologic Exams  [facility] x-ray 12/28/2012 chest x-ray suggestive right lower lobe infiltrate. Evidence of old left-sided rib fractures 01/06/2013 CXR: New minimal pulmonary vascular congestion, small left pleural effusion, new. Patchy bibasilar atx or PNA.   Laboratory Studies  [facility], external  02/20/12 Glu 92, BUn 32, Cr. 1.06, Na 139, K+ 4.2, Protein/ LFTS WNL  TC 208, TG 109, HDL 43, LDL 145   06/18/2012: WBC 4.1, hemoglobin 13.3, hematocrit 38.3, platelet 248   Glucose 103, BUN 24, creatinine 0.96, sodium 134, potassium 4.3. LFTs/proteins WNL.   TSH 1.99  Lab Results- [facility] 01/06/2013  Component Value   WBC 8.7   HGB 11.3*   HCT 34*   PLT 355       Glucose  114   NA 135*   K 3.7   CREATININE 1.2*   BUN 21       TSH 0.76       Review of Systems  DATA OBTAINED: from patient, nurse, medical record  GENERAL: Feels OK   No fever. Usual appetite, activity status SKIN: No itch, rash or open wounds EYES: No eye pain, dryness or itching  No change in vision EARS: No earache, tinnitus, change in hearing NOSE: No congestion, drainage or bleeding MOUTH/THROAT: No mouth or tooth pain  No sore throat    No difficulty chewing or swallowing   Feels sputum in her  throat almost all the time RESPIRATORY: No cough, wheezing, SOB CARDIAC: No chest pain, palpitations  No edema. GI: No abdominal pain  No N/V/D or constipation  No heartburn or reflux  NEUROLOGIC: No dizziness, fainting, headache,  No change in mental status.  PSYCHIATRIC: Intermittent episodes of anxiety/c/o depression. Sleeps well.  No behavior issue.    Physical Exam Filed Vitals:   04/04/13 1646  BP: 142/69  Pulse: 72  Temp: 97.8 F (36.6 C)  Resp: 18  Weight: 174 lb 6.4 oz (79.107 kg)   GENERAL APPEARANCE: No acute distress, appropriately groomed, normal body habitus. Alert, pleasant, conversant. SKIN: No diaphoresis, rash,   healing biopsy sites of the right lower cheek, forehead, right temple and left jawline HEAD: Normocephalic, atraumatic EYES: Conjunctiva/lids clear.  EARS:  Hearing decreased (not new). NOSE: No deformity or discharge. MOUTH/THROAT: Lips w/o lesions. Oral mucosa, tongue moist, w/o lesion. Oropharynx w/o redness or lesions. Near constant throat clearing and removal of saliva. NECK: Supple, full ROM. No thyroid tenderness, enlargement or nodule LYMPHATICS: No head, neck or supraclavicular adenopathy RESPIRATORY: Breathing is even, unlabored. Lung sounds are clear and full bilaterally CARDIOVASCULAR: Heart RRR. No murmur or extra heart sounds  EDEMA: No peripheral or periorbital edema. No ascites GASTROINTESTINAL: Abdomen is soft, non-tender, not distended w/ normal bowel sounds. MUSCULOSKELETAL: Back with significant kyphosis, No scoliosis or spinal process tenderness. Tender/ tight muscles rt. Anterior/posterior shoulder, deltoid. No joint tenderness NEUROLOGIC: Oriented to time, place, person. Speech clear, no tremor. PSYCHIATRIC: Mood and affect appropriate to situation    ASSESSMENT/PLAN  Unspecified essential hypertension Stable  Other specified disorder of the esophagus Excessive secretions may be related to esophageal dysmotility, will change  antihistamine to BID dosing. i fno imptovement add PPI.   Anxiety state, unspecified Stable, minimal use of prn lorazepam  Depressive disorder, not elsewhere classified Stable with current medication  Basal cell carcinoma, face Multiple lesions bx/ cryosurgery 03/20/2013. Pathology returned positive for basal cell cancer on specimens forehead, frontal scalp and rt. Angle of mouth. Patient is scheduled for MOHS procedure in January. BX sites are healing well   Follow up:  Routine or as needed    Mylin Hirano T.Aldea Avis, NP-C 04/04/2013

## 2013-04-04 NOTE — Assessment & Plan Note (Signed)
Stable with current medication

## 2013-04-04 NOTE — Assessment & Plan Note (Signed)
Stable

## 2013-04-07 ENCOUNTER — Non-Acute Institutional Stay (SKILLED_NURSING_FACILITY): Payer: Medicare Other | Admitting: Geriatric Medicine

## 2013-04-07 ENCOUNTER — Encounter: Payer: Self-pay | Admitting: Geriatric Medicine

## 2013-04-07 DIAGNOSIS — J069 Acute upper respiratory infection, unspecified: Secondary | ICD-10-CM

## 2013-04-07 NOTE — Progress Notes (Signed)
  Patient ID: [name], female   DOB: 1916/12/12, 79 y.o.   MRN: [id number]  [facility] [phone])  Code Status:  Contact Information   Name Relation Home Work [place]  [phone]     Pamelia Hoit  [phone]         Chief Complaint  Patient presents with  . Cough,hoarse voice    HPI: This is a 79 y.o. female resident of [facility].  Evaluation is requested today due to worsening cough, new hoarse voice, malaise.      Allergies  Allergen Reactions  . Sulfa Antibiotics    Medications Reviewed  Review of Systems  DATA OBTAINED: from patient, nurse, medical record,  GENERAL: Does not feel well, fatigued. No change in appetite or weight SKIN: No itch, rash    BX sites healing EYES: No eye pain, dryness or itching  No change in vision EARS: No earache, tinnitus, change in hearing NOSE: Nasal drainage present, No bleeding MOUTH/THROAT: No mouth or tooth pain  Mild sore throat, hoarse voice    No difficulty chewing or swallowing RESPIRATORY: Productive cough, mild wheeze today, No SOB CARDIAC: No chest pain, palpitations  No edema. GI: No abdominal pain  No N/V/D or constipation  No heartburn or reflux  MUSCULOSKELETAL: No joint pain, swelling or stiffness  Chronic back pain  No muscle ache, pain, weakness  Gait is unsteady  No recent falls.  NEUROLOGIC: No dizziness, fainting, headache, No change in mental status.  PSYCHIATRIC: No feelings of anxiety, depression Sleeps well.    Physical Exam Filed Vitals:   04/07/13 1133  BP: 123/95  Pulse: 78  Temp: 96.9 F (36.1 C)  Resp: 22   There is no height or weight on file to calculate BMI.  GENERAL APPEARANCE: No acute distress, appropriately groomed, normal body habitus. Alert, pleasant, conversant. SKIN: No diaphoresis, rash,  HEAD: Normocephalic, atraumatic EYES: Conjunctiva/lids clear. Pupils round, reactive. EARS: External  exam WNL, Poor hearing (chronic). NOSE: No deformity or discharge. MOUTH/THROAT: Lips w/o lesions. Oral mucosa, tongue moist, w/o lesion. Oropharynx w/o redness or lesions.  NECK: Supple, full ROM. No thyroid tenderness, enlargement or nodule LYMPHATICS: No head, neck or supraclavicular adenopathy RESPIRATORY: Breathing is even, unlabored. Lung sounds are clear and full. Congested cough present CARDIOVASCULAR: Heart RRR. No murmur or extra heart sounds NEUROLOGIC: Oriented to time, place, person.Speech clear, no tremor.  PSYCHIATRIC: Mood and affect appropriate to situation  ASSESSMENT/PLAN  Upper respiratory infection Cough,hoarse voice, malaise c/w viral URI- would like to r/o influenza as pt resides in SNF. Send nasal swab. Continue supportive measures    Follow up: Routine or as needed  Elliett Guarisco T.Marca Gadsby, NP-C 04/07/2013

## 2013-04-11 DIAGNOSIS — J069 Acute upper respiratory infection, unspecified: Secondary | ICD-10-CM | POA: Insufficient documentation

## 2013-04-11 NOTE — Assessment & Plan Note (Signed)
Cough,hoarse voice, malaise c/w viral URI- would like to r/o influenza as pt resides in SNF. Send nasal swab. Continue supportive measures

## 2013-06-17 ENCOUNTER — Other Ambulatory Visit: Payer: Self-pay | Admitting: Adult Health

## 2013-06-17 ENCOUNTER — Non-Acute Institutional Stay (SKILLED_NURSING_FACILITY): Payer: Medicare Other | Admitting: Adult Health

## 2013-06-17 ENCOUNTER — Encounter: Payer: Self-pay | Admitting: Adult Health

## 2013-06-17 DIAGNOSIS — F3289 Other specified depressive episodes: Secondary | ICD-10-CM

## 2013-06-17 DIAGNOSIS — N318 Other neuromuscular dysfunction of bladder: Secondary | ICD-10-CM

## 2013-06-17 DIAGNOSIS — I10 Essential (primary) hypertension: Secondary | ICD-10-CM

## 2013-06-17 DIAGNOSIS — K228 Other specified diseases of esophagus: Secondary | ICD-10-CM

## 2013-06-17 DIAGNOSIS — C44319 Basal cell carcinoma of skin of other parts of face: Secondary | ICD-10-CM

## 2013-06-17 DIAGNOSIS — K2289 Other specified disease of esophagus: Secondary | ICD-10-CM

## 2013-06-17 DIAGNOSIS — M1990 Unspecified osteoarthritis, unspecified site: Secondary | ICD-10-CM

## 2013-06-17 DIAGNOSIS — F329 Major depressive disorder, single episode, unspecified: Secondary | ICD-10-CM

## 2013-06-17 DIAGNOSIS — C44310 Basal cell carcinoma of skin of unspecified parts of face: Secondary | ICD-10-CM

## 2013-06-17 NOTE — Assessment & Plan Note (Addendum)
Pt with significant arthritis to rt knee. Is using walker less than daily. Is unable to tolerate weight to rt knee, except with 1 person assist transfer. Continue tylenol PRN pain

## 2013-06-17 NOTE — Assessment & Plan Note (Addendum)
Is feeling more lonely and isolated with her swallowing difficulty. Continue current medications and monitor for increased. We will refer back to GI to manage esophogeal issue

## 2013-06-17 NOTE — Assessment & Plan Note (Signed)
Stable. Continue current therapy.

## 2013-06-17 NOTE — Assessment & Plan Note (Addendum)
No improvement with BID antihistamine, or PPI that were initiated in January 2015. Failed 24hr clear liquid diet to resolve increased coughing and choking with eating. Speech therapy worked with pt through Jan 2015 with recommendations for pureed diet. Continues to have trouble swallowing pureed diet, especially meats. produces copius clear secretions that collect in her mouth. Reduce Allegra to 180 daily. Continue omeprazole 20mg  qd. Refer to GI for consideration of repeat upper endoscopy/ dilatation

## 2013-06-17 NOTE — Progress Notes (Signed)
Patient ID: [name], female   DOB: 12-Jul-1916, 80 y.o.   MRN: [id number]   [facility] [phone])  Code Status: DNR  Contact Information   Name Relation Home Work [place]  [phone]     Sherolyn Buba  [phone]         Chief Complaint  Patient presents with  . Dysphagia  . Hypertension  . Depression  . Medical Managment of Chronic Issues    HPI:  [name] is 80 y.o. Living on Skilled Nursing at [facility]. Staff reports increased choking and coughing while eating and other residents not tolerating being around her. She failed a 24 hour clear liquid trial; continues ot have excess secretions and feelingof drainage in the back of her throat.  She has been followed for ongoing esophogeal dysmotility that was better after dilation by GI 2013 until  December 2014 when increased secretions, coughing, choking returned. Was treated by speech therapy through January 2015, diet changed to pureed, Omeprazole 20mg  daily and allegra 180mg  BID added to address multiple possible causative factors.    Basal cell skin cancer surgery to face in January 2015  Last visit: 03/2013  ASSESSMENT/PLAN  Unspecified essential hypertension  Stable  Other specified disorder of the esophagus  Excessive secretions may be related to esophageal dysmotility, will change antihistamine to BID dosing. i fno imptovement add PPI.  Anxiety state, unspecified  Stable, minimal use of prn lorazepam  Depressive disorder, not elsewhere classified  Stable with current medication  Basal cell carcinoma, face  Multiple lesions bx/ cryosurgery 03/20/2013. Pathology returned positive for basal cell cancer on specimens forehead, frontal scalp and rt. Angle of mouth. Patient is scheduled for MOHS procedure in January. BX sites are healing well    Allergies  Allergen Reactions  . Sulfa Antibiotics     DATA REVIEWED  Laboratory Studies: Lab Results  Component Value Date   WBC  8.7 01/06/2013   HGB 11.3* 01/06/2013   HCT 34* 01/06/2013   PLT 355 01/06/2013   Lab Results  Component Value Date   NA 135* 01/06/2013   K 3.7 01/06/2013   GLU 114 01/06/2013   BUN 21 01/06/2013   CREATININE 1.2* 01/06/2013    REVIEW OF SYSTEMS  DATA OBTAINED: from patient, nurse, medical record  GENERAL: Feels OK No fever. Usual appetite, activity status  SKIN: No itch, rash or open wounds  EYES: No eye pain, dryness or itching No change in vision  EARS: No earache, tinnitus, change in hearing  NOSE: No congestion, drainage or bleeding  MOUTH/THROAT: No mouth or tooth pain  difficulty chewing or swallowing,even pureed diet, especially meats.  Feels sputum in her throat almost all the time. Choking and coughing with clear liquids on 24 hr trial.  RESPIRATORY: No cough, wheezing, SOB  CARDIAC: No chest pain, palpitations No edema.  GI: No abdominal pain No N/V/D or constipation No heartburn or reflux  NEUROLOGIC: No dizziness, fainting, headache, No change in mental status.  PSYCHIATRIC: Intermittent episodes of anxiety/c/o depression. Feels isolated due to choking coughing during meals. Lonely. Wishes she could see dtr more. Sleeps well. No behavior issue.    PHYSICAL EXAM Filed Vitals:   06/17/13 1115  BP: 138/66  Pulse: 76  Temp: 96.9 F (36.1 C)  Resp: 17  Weight: 168 lb 14.4 oz (76.613 kg)  SpO2: 94%   GENERAL APPEARANCE: No acute distress, appropriately groomed, normal body habitus. Alert, pleasant, conversant.  SKIN: No diaphoresis, rash, healing skin  cancer surgical site rt cheek, incision nearly healed, no erythema, fever HEAD: Normocephalic, atraumatic  EYES: Conjunctiva/lids clear, uses glasses EARS: Hearing decreased (not new).  NOSE: No deformity or discharge.  MOUTH/THROAT: Lips w/o lesions. Oral mucosa, tongue very moist, w/o lesion. Oropharynx w/o redness or lesions, excess secretions present. Near constant throat clearing and removal of saliva   NECK: Supple,  full ROM. No thyroid tenderness, enlargement or nodule  LYMPHATICS: No head, neck or supraclavicular adenopathy  RESPIRATORY: Breathing is even, unlabored. CTA in all fields   CARDIOVASCULAR: Heart RRR. No murmur or extra heart sounds  EDEMA: No peripheral or periorbital edema. No ascites  GASTROINTESTINAL: Abdomen is soft, non-tender, not distended w/ normal bowel sounds.  MUSCULOSKELETAL: Back with significant kyphosis, No scoliosis or spinal process tenderness. Tender/ tight muscles rt. Anterior/posterior shoulder, deltoid. Decreased ROM to bilateral needs, rt greater than left. Significant difficulty up to walker and transfer. Uses wheelchair except for transfer NEUROLOGIC: Oriented to time, place, person. Speech clear, no tremor.  PSYCHIATRIC: Mood and affect appropriate to situation.   ASSESSMENT/PLAN  Unspecified essential hypertension Stable. Continue current therapy  Other specified disorder of the esophagus No improvement with BID antihistamine, or PPI that were initiated in January 2015. Failed 24hr clear liquid diet to resolve increased coughing and choking with eating. Speech therapy worked with pt through Jan 2015 with recommendations for pureed diet. Continues to have trouble swallowing pureed diet, especially meats. produces copius clear secretions that collect in her mouth. Reduce Allegra to 180 daily. Continue omeprazole 20mg  qd. Refer to GI for consideration of repeat upper endoscopy/ dilatation  Basal cell carcinoma, face Surgical sites are healing well, dressing removed today. Last derm. Evaluation  06/04/13, Believe all cancer was removed. Follow as needed  Osteoarthrosis, unspecified whether generalized or localized, unspecified site Pt with significant arthritis to rt knee. Is using walker less than daily. Is unable to tolerate weight to rt knee, except with 1 person assist transfer. Continue tylenol PRN pain  Depressive disorder, not elsewhere classified Is feeling  more lonely and isolated with her swallowing difficulty. Continue current medications and monitor for increased. We will refer back to GI to manage esophogeal issue  Hypertonicity of bladder No new problems. Continue myrbitriq daily    Family/ staff Communication:  Discussed care plan with daughter. She is following up with Dr. Earlean Shawl, GI for management of esophogeal issues   Labs/tests ordered: CBC and BMP 06/19/13   Follow up: Return for Routine/as needed.   Kirby Cortese T.Ainsley Sanguinetti, NP-C/Stephanie [employer] student [employer] [phone]  06/17/2013

## 2013-06-17 NOTE — Assessment & Plan Note (Addendum)
No new problems. Continue myrbitriq daily

## 2013-06-17 NOTE — Assessment & Plan Note (Addendum)
Surgical sites are healing well, dressing removed today. Last derm. Evaluation  06/04/13, Believe all cancer was removed. Follow as needed

## 2013-06-19 LAB — BASIC METABOLIC PANEL
BUN: 11 mg/dL (ref 4–21)
Creatinine: 0.8 mg/dL (ref 0.5–1.1)
GLUCOSE: 93 mg/dL
POTASSIUM: 3.3 mmol/L — AB (ref 3.4–5.3)
Sodium: 130 mmol/L — AB (ref 137–147)

## 2013-06-19 LAB — CBC AND DIFFERENTIAL
HCT: 34 % — AB (ref 36–46)
Hemoglobin: 11.2 g/dL — AB (ref 12.0–16.0)
Platelets: 245 10*3/uL (ref 150–399)
WBC: 5 10^3/mL

## 2013-06-24 ENCOUNTER — Encounter: Payer: Self-pay | Admitting: *Deleted

## 2013-07-11 HISTORY — PX: ESOPHAGOGASTRODUODENOSCOPY ENDOSCOPY: SHX5814

## 2013-07-25 ENCOUNTER — Encounter: Payer: Self-pay | Admitting: Geriatric Medicine

## 2013-07-25 ENCOUNTER — Non-Acute Institutional Stay (SKILLED_NURSING_FACILITY): Payer: Medicare Other | Admitting: Geriatric Medicine

## 2013-07-25 DIAGNOSIS — K228 Other specified diseases of esophagus: Secondary | ICD-10-CM

## 2013-07-25 DIAGNOSIS — F329 Major depressive disorder, single episode, unspecified: Secondary | ICD-10-CM

## 2013-07-25 DIAGNOSIS — M1990 Unspecified osteoarthritis, unspecified site: Secondary | ICD-10-CM

## 2013-07-25 DIAGNOSIS — F3289 Other specified depressive episodes: Secondary | ICD-10-CM

## 2013-07-25 DIAGNOSIS — I10 Essential (primary) hypertension: Secondary | ICD-10-CM

## 2013-07-25 DIAGNOSIS — K2289 Other specified disease of esophagus: Secondary | ICD-10-CM

## 2013-07-25 NOTE — Progress Notes (Signed)
Patient ID: [name], female   DOB: 07/11/1916, 80 y.o.   MRN: [id number]   [facility] SN[facility] [phone])  Code Status: DNR  Contact Information   Name Relation Home Work [place]  [phone]     Sherolyn Buba  [phone]         Chief Complaint  Patient presents with  . Medical Managment of Chronic Issues    HPI:  This is a 80 y.o. female resident of [place] section evaluated today for management of ongoing medical issues.    Last visit:  Unspecified essential hypertension Stable. Continue current therapy  Other specified disorder of the esophagus No improvement with BID antihistamine, or PPI that were initiated in January 2015. Failed 24hr clear liquid diet to resolve increased coughing and choking with eating. Speech therapy worked with pt through Jan 2015 with recommendations for pureed diet. Continues to have trouble swallowing pureed diet, especially meats. produces copius clear secretions that collect in her mouth. Reduce Allegra to 180 daily. Continue omeprazole 20mg  qd. Refer to GI for consideration of repeat upper endoscopy/ dilatation  Basal cell carcinoma, face Surgical sites are healing well, dressing removed today. Last derm. Evaluation  06/04/13, Believe all cancer was removed. Follow as needed  Osteoarthrosis, unspecified whether generalized or localized, unspecified site Pt with significant arthritis to rt knee. Is using walker less than daily. Is unable to tolerate weight to rt knee, except with 1 person assist transfer. Continue tylenol PRN pain Depressive disorder, not elsewhere classified Is feeling more lonely and isolated with her swallowing difficulty. Continue current medications and monitor for increased. We will refer back to GI to manage esophogeal issue   Since last visit patient was evaluated by gastroenterology, Dr. Lu Duffel got and underwent upper endoscopy with esophageal  dilatation. Patient has been swallowing well, no coughing choking with meals. Speech therapy has been following this patient recently advanced her diet to mechanical soft. Patient's overall mood has improved since she is having less difficulty with swallowing and eating. Physical therapy has been asked to reevaluate this patient due to intermittent difficulties with transfers. Review of facility record shows patient's vital signs have been stable. She has had a 4 pound weight loss in the last month     Allergies  Allergen Reactions  . Sulfa Antibiotics     DATA REVIEWED  Laboratory Studies: Lab Results  Component Value Date   WBC 8.7 01/06/2013   HGB 11.3* 01/06/2013   HCT 34* 01/06/2013   PLT 355 01/06/2013   Lab Results  Component Value Date   NA 135* 01/06/2013   K 3.7 01/06/2013   GLU 114 01/06/2013   BUN 21 01/06/2013   CREATININE 1.2* 01/06/2013    Lab Results  Component Value Date   WBC 5.0 06/19/2013   HGB 11.2* 06/19/2013   HCT 34* 06/19/2013   PLT 245 06/19/2013   Lab Results  Component Value Date   NA 130* 06/19/2013   K 3.3* 06/19/2013   GLU 93 06/19/2013   BUN 11 06/19/2013   CREATININE 0.8 06/19/2013     REVIEW OF SYSTEMS  DATA OBTAINED: from patient, nurse, medical record  GENERAL: Feels "pretty good", just returned from bingo game.  No fever. Usual appetite, activity status  SKIN: No itch, rash or open wounds  EYES: No eye pain, dryness or itching No change in vision  EARS: No earache, tinnitus, change in hearing  NOSE: No congestion, drainage or bleeding  MOUTH/THROAT:  No mouth or tooth pain  denies any difficulty chewing or swallowing. Is very happy her diet has been advanced from pured to mechanical soft. RESPIRATORY: No cough, wheezing, SOB  CARDIAC: No chest pain, palpitations No edema.  GI: No abdominal pain No N/V/D or constipation No heartburn or reflux  MUSCULOSKELETAL: Chronic complaints of knee and back pain. Very limited ambulation; transfers with  assistance and walker, uses  wheelchair for most mobilization NEUROLOGIC: No dizziness, fainting, headache, No change in mental status.  PSYCHIATRIC: No  complaints of anxiety or depression. Reports she gets out to as many activities that she can. Sleeps well. No behavior issue.    PHYSICAL EXAM Filed Vitals:   07/25/13 1507  BP: 134/71  Pulse: 64  Temp: 97.4 F (36.3 C)  Resp: 18  Weight: 164 lb 8 oz (74.617 kg)   GENERAL APPEARANCE: No acute distress, appropriately groomed, normal body habitus. Alert, pleasant, conversant.  SKIN: No diaphoresis, rash, unusual lesions or wounds HEAD: Normocephalic, atraumatic  EYES: Conjunctiva/lids clear, wears glasses EARS: Hearing decreased (not new).  NOSE: No deformity or discharge.  MOUTH/THROAT: Lips w/o lesions. Oral mucosa, tongue very moist, w/o lesion. Oropharynx w/o redness or lesions, No excess secretions present.  NECK: Supple, full ROM. No thyroid tenderness, enlargement or nodule  LYMPHATICS: No head, neck or supraclavicular adenopathy  RESPIRATORY: Breathing is even, unlabored. CTA in all fields   CARDIOVASCULAR: Heart RRR. No murmur or extra heart sounds  EDEMA: No peripheral or periorbital edema. No ascites  GASTROINTESTINAL: Abdomen is soft, non-tender, not distended w/ normal bowel sounds.  MUSCULOSKELETAL: Back with significant kyphosis, No scoliosis or spinal process tenderness. NEUROLOGIC: Oriented to time, place, person. Speech clear, no tremor.  PSYCHIATRIC: Mood and affect appropriate to situation.   ASSESSMENT/PLAN  Unspecified essential hypertension Blood pressure remains satisfactory at 121-144/59-74, pulse satisfactory 64-67. Most recent BMP with a mildly low potassium, repeat  Other specified disorder of the esophagus Patient is now status post upper endoscopy and dilatation 07/11/2013. ST has advanced her diet to mechanical soft. Patient is not experiencing any significant swallowing or coughing difficulties,  has  fewer retained oropharyngeal secretions. Patient had a 4 pound weight loss in the last month anticipate this will turn around with advancement of her diet  Osteoarthrosis, unspecified whether generalized or localized, unspecified site Patient is having intermittent difficulties with transfers, PT will be evaluating to determine if mechanical lift is required.  Depressive disorder, not elsewhere classified Gen. mood is improved since her swallowing difficulties have improved. Continue current medications, continue to monitor for signs of anxiety depression.    Family/ staff Communication:    Labs/tests ordered:  BMP 4/14   Follow up: Return for Routine/as needed.   Mardene Celeste, NP-C [place] [phone]  07/25/2013

## 2013-07-28 NOTE — Assessment & Plan Note (Signed)
Gen. mood is improved since her swallowing difficulties have improved. Continue current medications, continue to monitor for signs of anxiety depression.

## 2013-07-28 NOTE — Assessment & Plan Note (Signed)
Blood pressure remains satisfactory at 121-144/59-74, pulse satisfactory 64-67. Most recent BMP with a mildly low potassium, repeat

## 2013-07-28 NOTE — Assessment & Plan Note (Signed)
Patient is now status post upper endoscopy and dilatation 07/11/2013. ST has advanced her diet to mechanical soft. Patient is not experiencing any significant swallowing or coughing difficulties,  has fewer retained oropharyngeal secretions. Patient had a 4 pound weight loss in the last month anticipate this will turn around with advancement of her diet

## 2013-07-28 NOTE — Assessment & Plan Note (Signed)
Patient is having intermittent difficulties with transfers, PT will be evaluating to determine if mechanical lift is required.

## 2013-07-29 ENCOUNTER — Encounter: Payer: Self-pay | Admitting: Internal Medicine

## 2013-08-07 ENCOUNTER — Other Ambulatory Visit: Payer: Self-pay | Admitting: Dermatology

## 2013-08-07 LAB — LIPID PANEL
Cholesterol: 171 mg/dL (ref 0–200)
HDL: 42 mg/dL (ref 35–70)
LDL Cholesterol: 109 mg/dL
Triglycerides: 102 mg/dL (ref 40–160)

## 2013-08-29 ENCOUNTER — Encounter: Payer: Self-pay | Admitting: Geriatric Medicine

## 2013-08-29 ENCOUNTER — Non-Acute Institutional Stay (SKILLED_NURSING_FACILITY): Payer: Medicare Other | Admitting: Geriatric Medicine

## 2013-08-29 DIAGNOSIS — K2289 Other specified disease of esophagus: Secondary | ICD-10-CM

## 2013-08-29 DIAGNOSIS — F411 Generalized anxiety disorder: Secondary | ICD-10-CM

## 2013-08-29 DIAGNOSIS — I10 Essential (primary) hypertension: Secondary | ICD-10-CM

## 2013-08-29 DIAGNOSIS — F329 Major depressive disorder, single episode, unspecified: Secondary | ICD-10-CM

## 2013-08-29 DIAGNOSIS — K228 Other specified diseases of esophagus: Secondary | ICD-10-CM

## 2013-08-29 DIAGNOSIS — F3289 Other specified depressive episodes: Secondary | ICD-10-CM

## 2013-08-29 NOTE — Assessment & Plan Note (Signed)
Recent BP range satisfactory;125-152/61-77, P 70-75. Continue current medication, update BMP

## 2013-08-29 NOTE — Assessment & Plan Note (Signed)
Tolerating current diet w/o coughing or choking. Mild weight loss is expected due to small portions. BMI remains>30.

## 2013-08-29 NOTE — Progress Notes (Signed)
Patient ID: [name], female   DOB: 1916-08-22, 80 y.o.   MRN: [id number]   [facility] SN[facility] [phone])  Code Status: DNR  Contact Information   Name Relation Home Work [place]  [phone]     Sherolyn Buba  [phone]         Chief Complaint  Patient presents with  . Medical Management of Chronic Issues    HPI:  This is a 80 y.o. female resident of [place] section evaluated today for management of ongoing medical issues.    Last visit:  Unspecified essential hypertension Blood pressure remains satisfactory at 121-144/59-74, pulse satisfactory 64-67. Most recent BMP with a mildly low potassium, repeat Other specified disorder of the esophagus Patient is now status post upper endoscopy and dilatation 07/11/2013. ST has advanced her diet to mechanical soft. Patient is not experiencing any significant swallowing or coughing difficulties,  has fewer retained oropharyngeal secretions. Patient had a 4 pound weight loss in the last month anticipate this will turn around with advancement of her diet Osteoarthrosis, unspecified whether generalized or localized, unspecified site Patient is having intermittent difficulties with transfers, PT will be evaluating to determine if mechanical lift is required. Depressive disorder, not elsewhere classified Gen. mood is improved since her swallowing difficulties have improved. Continue current medications, continue to monitor for signs of anxiety depression.   Since last visit patient was seen in follow up of Skin cancer by Dr. Rosalie Gums. Previous bx sites healing well, new lesion rt. Chest suspicious for BCC; shave bx performed. Path report not available at this time. Review of facility record shows stable VS, adequate PO intake. She has lost a few pounds, intentional (small portions). BMP not ordered after last visit. Lipid panel requested by pharmacy, satisfactory.   Allergies  Allergen Reactions  . Sulfa Antibiotics     DATA REVIEWED  Laboratory Studies:  Lab Results  Component Value Date   WBC 5.0 06/19/2013   HGB 11.2* 06/19/2013   HCT 34* 06/19/2013   PLT 245 06/19/2013   Lab Results  Component Value Date   NA 130* 06/19/2013   K 3.3* 06/19/2013   GLU 93 06/19/2013   BUN 11 06/19/2013   CREATININE 0.8 06/19/2013    Lab Results  Component Value Date   CHOL 171 08/07/2013   HDL 42 08/07/2013   LDLCALC 109 08/07/2013   TRIG 102 08/07/2013     REVIEW OF SYSTEMS  DATA OBTAINED: from patient, nurse, medical record  GENERAL: Feels "pretty good", just returned from [facility] game.  No fever. Usual appetite, activity status  SKIN: No itch, rash or open wounds  EYES: No eye pain, dryness or itching No change in vision  EARS: No earache, tinnitus, change in hearing  NOSE: No congestion, drainage or bleeding  MOUTH/THROAT: No mouth or tooth pain  denies any difficulty chewing or swallowing. No problem reported w/mechanical soft diet. Self reports coughing/ excess sputum at night RESPIRATORY: No cough, wheezing, SOB  CARDIAC: No chest pain, palpitations No edema.  GI: No abdominal pain No N/V/D or constipation No heartburn or reflux  MUSCULOSKELETAL: Chronic complaints of knee and back pain. Very limited ambulation; transfers with assistance and walker, uses  wheelchair for most mobilization NEUROLOGIC: No dizziness, fainting, headache, No change in mental status.  PSYCHIATRIC: No  complaints of anxiety or depression. Reports she gets out to as many activities that she can, feels lonesome sometimes. Sleeps well. No behavior issue.  PHYSICAL EXAM Filed Vitals:   08/29/13 1332  BP: 131/80  Pulse: 75  Temp: 97.9 F (36.6 C)  Resp: 20  Weight: 163 lb 4.8 oz (74.072 kg)  SpO2: 94%  Body mass index is 30.87 kg/(m^2).  GENERAL APPEARANCE: No acute distress, appropriately groomed, normal body habitus. Alert, pleasant, conversant.  SKIN: No diaphoresis,  rash, unusual lesions or wounds HEAD: Normocephalic, atraumatic  EYES: Conjunctiva/lids clear, wears glasses EARS: Hearing decreased (not new).  NOSE: No deformity or discharge.  MOUTH/THROAT: Lips w/o lesions. Oral mucosa, tongue very moist, w/o lesion. Oropharynx w/o redness or lesions, No excess secretions present.  NECK: Supple, full ROM. No thyroid tenderness, enlargement or nodule  LYMPHATICS: No head, neck or supraclavicular adenopathy  RESPIRATORY: Breathing is even, unlabored. Few scattered rhonchi in bilateral lung fields CARDIOVASCULAR: Heart RRR. No murmur or extra heart sounds  EDEMA: No peripheral or periorbital edema. No ascites  GASTROINTESTINAL: Abdomen is soft, non-tender, not distended w/ normal bowel sounds.  MUSCULOSKELETAL: Back with significant kyphosis, No scoliosis or spinal process tenderness.  NEUROLOGIC: Oriented to time, place, person. Speech clear, no tremor.  PSYCHIATRIC: Mood and affect appropriate to situation.   ASSESSMENT/PLAN  Unspecified essential hypertension Recent BP range satisfactory;125-152/61-77, P 70-75. Continue current medication, update BMP  Other specified disorder of the esophagus Tolerating current diet w/o coughing or choking. Mild weight loss is expected due to small portions. BMI remains>30.   Anxiety state, unspecified PAtietn with occasional increased anxiety managed with 1:1 interventions. No recent use of prn anxiolytic.  Depressive disorder, not elsewhere classified No recent report of depressive symptoms, continue medication    Family/ staff Communication:    Labs/tests ordered:     Follow up: Return for Routine/as needed.   Mardene Celeste, NP-C [place] [phone]  08/29/2013

## 2013-08-29 NOTE — Assessment & Plan Note (Signed)
PAtietn with occasional increased anxiety managed with 1:1 interventions. No recent use of prn anxiolytic.

## 2013-08-29 NOTE — Assessment & Plan Note (Signed)
No recent report of depressive symptoms, continue medication

## 2013-09-02 LAB — BASIC METABOLIC PANEL
BUN: 22 mg/dL — AB (ref 4–21)
CREATININE: 1.2 mg/dL — AB (ref 0.5–1.1)
Glucose: 83 mg/dL
Potassium: 3.7 mmol/L (ref 3.4–5.3)
Sodium: 137 mmol/L (ref 137–147)

## 2013-11-06 ENCOUNTER — Other Ambulatory Visit: Payer: Self-pay | Admitting: Dermatology

## 2013-11-11 ENCOUNTER — Telehealth: Payer: Self-pay | Admitting: *Deleted

## 2013-11-11 ENCOUNTER — Non-Acute Institutional Stay (SKILLED_NURSING_FACILITY): Payer: Medicare Other | Admitting: Nurse Practitioner

## 2013-11-11 DIAGNOSIS — F329 Major depressive disorder, single episode, unspecified: Secondary | ICD-10-CM

## 2013-11-11 DIAGNOSIS — F3289 Other specified depressive episodes: Secondary | ICD-10-CM

## 2013-11-11 DIAGNOSIS — K228 Other specified diseases of esophagus: Secondary | ICD-10-CM

## 2013-11-11 DIAGNOSIS — I10 Essential (primary) hypertension: Secondary | ICD-10-CM

## 2013-11-11 DIAGNOSIS — R32 Unspecified urinary incontinence: Secondary | ICD-10-CM

## 2013-11-11 DIAGNOSIS — K2289 Other specified disease of esophagus: Secondary | ICD-10-CM

## 2013-11-11 DIAGNOSIS — M1990 Unspecified osteoarthritis, unspecified site: Secondary | ICD-10-CM

## 2013-11-11 NOTE — Progress Notes (Signed)
Patient ID: [name], female   DOB: 1916-05-09, 80 y.o.   MRN: [id number]     Nursing Home Location:  [place] of Service: SNF (31)  PCP: Estill Dooms, MD  Allergies  Allergen Reactions  . Sulfa Antibiotics     Chief Complaint  Patient presents with  . Medical Management of Chronic Issues    HPI:  This is a 80 y.o. female resident of [place] section evaluated today for management of ongoing medical issues.  Pt with a pmh of depression, HTN, esophageal stricture s/p dilatation 06/2013, OA. Pt reports she has had an increase in mucous at night over the past several weeks. Denies fevers, chills, or shortness of breath. Staff without concerns at this time. Pt following with Dr Ubaldo Glassing regarding basal cell carcinoma lesion on right upper chest.   Review of Systems:   REVIEW OF SYSTEMS  DATA OBTAINED: from patient, nurse, medical record  GENERAL: Feels "pretty good",  No fever. stable appetite and activity status  SKIN: wound care for biopsy site to right upper chest  EYES: No eye pain, dryness or itching No change in vision  EARS: No earache, tinnitus, change in hearing  NOSE: No congestion, drainage or bleeding  MOUTH/THROAT: No mouth or tooth pain  denies any difficulty chewing or swallowing. No problem reported w/mechanical soft diet. Self reports coughing/ excess sputum at night RESPIRATORY: No cough, wheezing, SOB  CARDIAC: No chest pain, palpitations No edema.  GI: No abdominal pain No N/V/D or constipation No heartburn or reflux  MUSCULOSKELETAL: Chronic complaints of knee and back pain NEUROLOGIC: No dizziness, fainting, headache, No change in mental status.  PSYCHIATRIC: No  complaints of anxiety or depression. Reports she gets out to as many activities that she can, feels lonesome sometimes. Sleeps well. No behavior issue.     Past Medical History  Diagnosis Date  . Allergic rhinitis 07/23/2012   . Unspecified malignant neoplasm of skin of other and unspecified parts of face 2012  . Malignant neoplasm of breast (female), unspecified site 71  . Neoplasm of unspecified nature of breast 2000  . Vitamin D deficiency 2009  . Other and unspecified hyperlipidemia 2005  . Anemia, unspecified 2005  . Anxiety state, unspecified 2005  . Depressive disorder, not elsewhere classified 2005  . Senile cataract, unspecified 2005  . Unspecified essential hypertension 2005  . Subdural hemorrhage 2007  . External hemorrhoids without mention of complication 8003  . Unspecified venous (peripheral) insufficiency 2001  . Disturbance of salivary secretion 2007  . GERD (gastroesophageal reflux disease) 2005  . Hypertonicity of bladder 2006  . Urinary tract infection, site not specified 2013  . Other seborrheic dermatitis 2007  . Osteoarthrosis, unspecified whether generalized or localized, unspecified site 2005  . Pain in joint, shoulder region 2008  . Senile osteoporosis 2000  . Insomnia, unspecified 2005  . Abnormality of gait 2007  . Unspecified urinary incontinence 2005  . Vertebral fracture, closed 2006  . Closed fracture of sternum 2006  . Closed fracture of two ribs 2013    left 5,6th  . Pain in joint, shoulder region 10/21/2012  . Basal cell carcinoma, face 03/20/2013   Past Surgical History  Procedure Laterality Date  . Joint replacement Left     Knee  . Abdominal hysterectomy    . Breast surgery Right     Mastectomy  . Appendectomy    . Mohs surgery  2010    Nose  .  Cyst excision Left 2011    Calcified cyst L shin   Social History:   reports that she quit smoking about 63 years ago. She does not have any smokeless tobacco history on file. She reports that she drinks about .5 ounces of alcohol per week. Her drug history is not on file.  Family History  Problem Relation Age of Onset  . Heart disease Mother   . Pneumonia Father     Medications: Patient's Medications  New  Prescriptions   No medications on file  Previous Medications   ACETAMINOPHEN (TYLENOL) 325 MG TABLET    Take 650 mg by mouth 2 (two) times daily. May have additional 650mg  daily as needed for pain relief   ASPIRIN 81 MG TABLET    Take 81 mg by mouth daily. For CV protection   ATORVASTATIN (LIPITOR) 10 MG TABLET    Take 10 mg by mouth daily. For hyperlipedemia   AZITHROMYCIN (ZITHROMAX) 500 MG TABLET    Take 500 mg by mouth daily.   FEXOFENADINE (ALLEGRA) 180 MG TABLET    Take 180 mg by mouth daily. For seasonal allergies   GUAIFENESIN (ROBITUSSIN) 100 MG/5ML LIQUID    Take 200 mg by mouth 4 (four) times daily as needed for cough.   HYDROCORTISONE CREAM 1 %    Apply topically 2 (two) times daily as needed (to face for flaking. redness).   LORAZEPAM (ATIVAN) 0.5 MG TABLET    Take 0.5 mg by mouth as directed. May have .5mg  daily as needed to reduce anxiety   LOSARTAN-HYDROCHLOROTHIAZIDE (HYZAAR) 100-25 MG PER TABLET    Take 1 tablet by mouth daily. For HTN   MELATONIN 5 MG TABS    Take 1 tablet by mouth at bedtime. For insomnia   METOPROLOL SUCCINATE (TOPROL-XL) 25 MG 24 HR TABLET    Take 12.5 mg by mouth at bedtime. For HTN   MIRABEGRON ER (MYRBETRIQ) 25 MG TB24    Take 25 mg by mouth daily. For urinary incontinence   MULTIPLE VITAMINS-MINERALS (CERTA-VITE SENIOR-LUTEIN PO)    Take 1 tablet by mouth daily. For nutritional supplement   OMEPRAZOLE (PRILOSEC) 20 MG CAPSULE    Take 20 mg by mouth at bedtime. For swallowing dysfunction   PAROXETINE (PAXIL) 20 MG TABLET    Take 20 mg by mouth at bedtime. For depression   SENNOSIDES-DOCUSATE SODIUM (SENOKOT-S) 8.6-50 MG TABLET    Take 1 tablet by mouth at bedtime. For constipation   SODIUM CHLORIDE (OCEAN) 0.65 % NASAL SPRAY    Place 1 spray into the nose as needed for congestion.   TRAMADOL (ULTRAM) 50 MG TABLET    Take 50 mg by mouth every 6 (six) hours as needed for pain.   TROLAMINE SALICYLATE (ASPERCREME) 10 % CREAM    Apply 1 application topically  3 (three) times daily. Apply to both knees  Modified Medications   No medications on file  Discontinued Medications   No medications on file     Physical Exam:  Filed Vitals:   11/11/13 1032  BP: 155/69  Pulse: 72  Temp: 97.8 F (36.6 C)  Resp: 20  Weight: 162 lb (73.483 kg)  SpO2: 92%    GENERAL APPEARANCE: No acute distress, appropriately groomed, normal body habitus. Alert, pleasant, conversant.  SKIN: routine wound care by staff to right upper chest after biopsy  HEAD: Normocephalic, atraumatic  EYES: Conjunctiva/lids clear, wears glasses EARS: chronic HOH  NOSE: No deformity or discharge.  MOUTH/THROAT: Lips w/o lesions. Oral  mucosa, tongue very moist, w/o lesion. Oropharynx w/o redness or lesions, No excess secretions present.  NECK: Supple, full ROM. No thyroid tenderness, enlargement or nodule  LYMPHATICS: No head, neck or supraclavicular adenopathy  RESPIRATORY: Breathing is even, unlabored. Few scattered rhonchi in bilateral lung fields CARDIOVASCULAR: Heart RRR. No murmur or extra heart sounds  EDEMA: No peripheral or periorbital edema. No ascites  GASTROINTESTINAL: Abdomen is soft, non-tender, not distended w/ normal bowel sounds.  MUSCULOSKELETAL: Back with significant kyphosis, No scoliosis or spinal process tenderness.  NEUROLOGIC: Oriented to time, place, person. Speech clear, no tremor.  PSYCHIATRIC: Mood and affect appropriate to situation.     Labs reviewed: Basic Metabolic Panel:  Recent Labs  01/06/13 06/19/13 09/02/13  NA 135* 130* 137  K 3.7 3.3* 3.7  BUN 21 11 22*  CREATININE 1.2* 0.8 1.2*   Liver Function Tests: No results found for this basename: AST, ALT, ALKPHOS, BILITOT, PROT, ALBUMIN,  in the last 8760 hours No results found for this basename: LIPASE, AMYLASE,  in the last 8760 hours No results found for this basename: AMMONIA,  in the last 8760 hours CBC:  Recent Labs  01/06/13 06/19/13  WBC 8.7 5.0  HGB 11.3* 11.2*  HCT 34*  34*  PLT 355 245   Cardiac Enzymes: No results found for this basename: CKTOTAL, CKMB, CKMBINDEX, TROPONINI,  in the last 8760 hours BNP: No components found with this basename: POCBNP,  CBG: No results found for this basename: GLUCAP,  in the last 8760 hours TSH:  Recent Labs  01/06/13  TSH 0.76   A1C: No results found for this basename: HGBA1C   Lipid Panel:  Recent Labs  08/07/13  CHOL 171  HDL 42  LDLCALC 109  TRIG 102     Assessment/Plan 1. Depressive disorder, not elsewhere classified Appears to be well controlled, no overt sadness, affect appropriate, will cont paxil  2. Unspecified essential hypertension -stable on current regimen   3. Unspecified urinary incontinence -conts on myrbetiq at this time  4. Other specified disorder of the esophagus -stable at this time, does well with eating without any complaints of GERD or having food getting stuck, or coughing during meals   5. Osteoarthrosis, unspecified whether generalized or localized, unspecified site -on-going knee pain, reports Tylenol helps

## 2013-11-11 NOTE — Telephone Encounter (Signed)
Received [facility] Report on patient for skin biopsy of Right upper chest: Basal Cell Carcinoma, There are dermal aggregates of atypical basaloid cells. Surrounding fibrosis and inflammation are present. The lesion has arisen within a superficial basal cell carcinoma.  Placed in [place] folder to sign off.

## 2013-12-30 ENCOUNTER — Non-Acute Institutional Stay (SKILLED_NURSING_FACILITY): Payer: Medicare Other | Admitting: Nurse Practitioner

## 2013-12-30 DIAGNOSIS — J189 Pneumonia, unspecified organism: Secondary | ICD-10-CM

## 2013-12-30 NOTE — Progress Notes (Signed)
Patient ID: [name], female   DOB: May 13, 1916, 80 y.o.   MRN: [id number]     Nursing Home Location:  [place] of Service: SNF (31)  PCP: Estill Dooms, MD  Allergies  Allergen Reactions  . Sulfa Antibiotics     Chief Complaint  Patient presents with  . Acute Visit    HPI:  This is a 80 y.o. female resident of [facility] who is being seen today at the request of nursing due to cough, congestion, wheezing and pneumonitis on chest xray dx last night, NP on-call placed on avelox which started today. Pt now on 02 to keep sats over 90 %. With increase cough and thick greenish yellow sputum and shortness of breath at times.    REVIEW OF SYSTEMS  DATA OBTAINED: from patient, nurse, medical record  GENERAL:  No fever. Decreased appetite  EYES: No eye pain, dryness or itching No change in vision  EARS: No earache, tinnitus, change in hearing  NOSE: No congestion, drainage or bleeding  MOUTH/THROAT: No mouth or tooth pain  denies any difficulty chewing or swallowing. No problem reported w/mechanical soft diet.  RESPIRATORY: positive for cough, congestion, wheezing, and SOB with exertion  CARDIAC: No chest pain, palpitations No edema.  GI: No abdominal pain No N/V/D or constipation No heartburn or reflux  MUSCULOSKELETAL: Chronic complaints of knee and back pain NEUROLOGIC: No dizziness, fainting, headache, No change in mental status.  PSYCHIATRIC: No  complaints of anxiety or depression. Reports she gets out to as many activities that she can, feels lonesome sometimes. Sleeps well. No behavior issue.     Past Medical History  Diagnosis Date  . Allergic rhinitis 07/23/2012  . Unspecified malignant neoplasm of skin of other and unspecified parts of face 2012  . Malignant neoplasm of breast (female), unspecified site 23  . Neoplasm of unspecified nature of breast 2000  . Vitamin D deficiency 2009  .  Other and unspecified hyperlipidemia 2005  . Anemia, unspecified 2005  . Anxiety state, unspecified 2005  . Depressive disorder, not elsewhere classified 2005  . Senile cataract, unspecified 2005  . Unspecified essential hypertension 2005  . Subdural hemorrhage 2007  . External hemorrhoids without mention of complication 2863  . Unspecified venous (peripheral) insufficiency 2001  . Disturbance of salivary secretion 2007  . GERD (gastroesophageal reflux disease) 2005  . Hypertonicity of bladder 2006  . Urinary tract infection, site not specified 2013  . Other seborrheic dermatitis 2007  . Osteoarthrosis, unspecified whether generalized or localized, unspecified site 2005  . Pain in joint, shoulder region 2008  . Senile osteoporosis 2000  . Insomnia, unspecified 2005  . Abnormality of gait 2007  . Unspecified urinary incontinence 2005  . Vertebral fracture, closed 2006  . Closed fracture of sternum 2006  . Closed fracture of two ribs 2013    left 5,6th  . Pain in joint, shoulder region 10/21/2012  . Basal cell carcinoma, face 03/20/2013   Past Surgical History  Procedure Laterality Date  . Joint replacement Left     Knee  . Abdominal hysterectomy    . Breast surgery Right     Mastectomy  . Appendectomy    . Mohs surgery  2010    Nose  . Cyst excision Left 2011    Calcified cyst L shin   Social History:   reports that she quit smoking about 63 years ago. She does not have any smokeless tobacco  history on file. She reports that she drinks about .5 ounces of alcohol per week. Her drug history is not on file.  Family History  Problem Relation Age of Onset  . Heart disease Mother   . Pneumonia Father     Medications: Patient's Medications  New Prescriptions   No medications on file  Previous Medications   ACETAMINOPHEN (TYLENOL) 325 MG TABLET    Take 650 mg by mouth 2 (two) times daily. May have additional 650mg  daily as needed for pain relief   ASPIRIN 81 MG TABLET     Take 81 mg by mouth daily. For CV protection   ATORVASTATIN (LIPITOR) 10 MG TABLET    Take 10 mg by mouth daily. For hyperlipedemia   FEXOFENADINE (ALLEGRA) 180 MG TABLET    Take 180 mg by mouth daily. For seasonal allergies   GUAIFENESIN (ROBITUSSIN) 100 MG/5ML LIQUID    Take 200 mg by mouth 4 (four) times daily as needed for cough.   HYDROCORTISONE CREAM 1 %    Apply topically 2 (two) times daily as needed (to face for flaking. redness).   LORAZEPAM (ATIVAN) 0.5 MG TABLET    Take 0.5 mg by mouth as directed. May have .5mg  daily as needed to reduce anxiety   LOSARTAN-HYDROCHLOROTHIAZIDE (HYZAAR) 100-25 MG PER TABLET    Take 1 tablet by mouth daily. For HTN   MELATONIN 5 MG TABS    Take 1 tablet by mouth at bedtime. For insomnia   METOPROLOL SUCCINATE (TOPROL-XL) 25 MG 24 HR TABLET    Take 12.5 mg by mouth at bedtime. For HTN   MIRABEGRON ER (MYRBETRIQ) 25 MG TB24    Take 25 mg by mouth daily. For urinary incontinence   MULTIPLE VITAMINS-MINERALS (CERTA-VITE SENIOR-LUTEIN PO)    Take 1 tablet by mouth daily. For nutritional supplement   OMEPRAZOLE (PRILOSEC) 20 MG CAPSULE    Take 20 mg by mouth at bedtime. For swallowing dysfunction   PAROXETINE (PAXIL) 20 MG TABLET    Take 20 mg by mouth at bedtime. For depression   SENNOSIDES-DOCUSATE SODIUM (SENOKOT-S) 8.6-50 MG TABLET    Take 1 tablet by mouth at bedtime. For constipation   SODIUM CHLORIDE (OCEAN) 0.65 % NASAL SPRAY    Place 1 spray into the nose as needed for congestion.   TRAMADOL (ULTRAM) 50 MG TABLET    Take 50 mg by mouth every 6 (six) hours as needed for pain.   TROLAMINE SALICYLATE (ASPERCREME) 10 % CREAM    Apply 1 application topically 3 (three) times daily. Apply to both knees  Modified Medications   No medications on file  Discontinued Medications   AZITHROMYCIN (ZITHROMAX) 500 MG TABLET    Take 500 mg by mouth daily.     Physical Exam:  Filed Vitals:   12/30/13 1426  BP: 129/67  Pulse: 93  Temp: 98.1 F (36.7 C)  Resp:  20  SpO2: 91%    GENERAL APPEARANCE: No acute distress, appropriately groomed, normal body habitus. Alert, pleasant, conversant.  SKIN: routine wound care by staff to right upper chest after biopsy  HEAD: Normocephalic, atraumatic  EYES: Conjunctiva/lids clear, wears glasses EARS: chronic HOH  NOSE: No deformity or discharge.  MOUTH/THROAT: Lips w/o lesions. Oral mucosa, tongue very moist, w/o lesion. Oropharynx w/o redness or lesions, No excess secretions present.  NECK: Supple, full ROM. No thyroid tenderness, enlargement or nodule  RESPIRATORY: Breathing is even, unlabored. scattered rhonchi in bilateral lung fields with diminished breath sounds throughout  CARDIOVASCULAR:  Heart RRR, increased HR No murmur or extra heart sounds  EDEMA: No peripheral or periorbital edema. No ascites  GASTROINTESTINAL: Abdomen is soft, non-tender, not distended w/ normal bowel sounds.  MUSCULOSKELETAL: Back with significant kyphosis, No scoliosis or spinal process tenderness.  NEUROLOGIC: Oriented to time, place, person. Speech clear, no tremor.  PSYCHIATRIC: Mood and affect appropriate to situation.     Labs reviewed: Basic Metabolic Panel:  Recent Labs  01/06/13 06/19/13 09/02/13  NA 135* 130* 137  K 3.7 3.3* 3.7  BUN 21 11 22*  CREATININE 1.2* 0.8 1.2*   Liver Function Tests: No results found for this basename: AST, ALT, ALKPHOS, BILITOT, PROT, ALBUMIN,  in the last 8760 hours No results found for this basename: LIPASE, AMYLASE,  in the last 8760 hours No results found for this basename: AMMONIA,  in the last 8760 hours CBC:  Recent Labs  01/06/13 06/19/13  WBC 8.7 5.0  HGB 11.3* 11.2*  HCT 34* 34*  PLT 355 245   Cardiac Enzymes: No results found for this basename: CKTOTAL, CKMB, CKMBINDEX, TROPONINI,  in the last 8760 hours BNP: No components found with this basename: POCBNP,  CBG: No results found for this basename: GLUCAP,  in the last 8760 hours TSH:  Recent Labs   01/06/13  TSH 0.76   A1C: No results found for this basename: HGBA1C   Lipid Panel:  Recent Labs  08/07/13  CHOL 171  HDL 42  LDLCALC 109  TRIG 102     Assessment/Plan 1. Pneumonia, organism unspecified -will culture sputum -increase fluid intake -mucinex dm twice daily with full glass of water for 14 days -cont avelox 400 mg daily for 10 days -duonebs every 6 hours scheduled for 1 week than as needed -cont O2 as needed

## 2014-01-16 ENCOUNTER — Encounter: Payer: Self-pay | Admitting: Internal Medicine

## 2014-02-14 ENCOUNTER — Emergency Department (HOSPITAL_COMMUNITY): Payer: Medicare Other

## 2014-02-14 ENCOUNTER — Encounter (HOSPITAL_COMMUNITY): Payer: Self-pay | Admitting: Emergency Medicine

## 2014-02-14 ENCOUNTER — Inpatient Hospital Stay (HOSPITAL_COMMUNITY)
Admission: EM | Admit: 2014-02-14 | Discharge: 2014-02-17 | DRG: 481 | Disposition: A | Discharge status: Skilled Nursing Facility | Payer: Medicare Other | Attending: Internal Medicine | Admitting: Internal Medicine

## 2014-02-14 DIAGNOSIS — I10 Essential (primary) hypertension: Secondary | ICD-10-CM | POA: Diagnosis present

## 2014-02-14 DIAGNOSIS — R52 Pain, unspecified: Secondary | ICD-10-CM

## 2014-02-14 DIAGNOSIS — M810 Age-related osteoporosis without current pathological fracture: Secondary | ICD-10-CM | POA: Diagnosis present

## 2014-02-14 DIAGNOSIS — Z853 Personal history of malignant neoplasm of breast: Secondary | ICD-10-CM

## 2014-02-14 DIAGNOSIS — Z01818 Encounter for other preprocedural examination: Secondary | ICD-10-CM

## 2014-02-14 DIAGNOSIS — Z66 Do not resuscitate: Secondary | ICD-10-CM | POA: Diagnosis present

## 2014-02-14 DIAGNOSIS — S72001A Fracture of unspecified part of neck of right femur, initial encounter for closed fracture: Secondary | ICD-10-CM | POA: Diagnosis present

## 2014-02-14 DIAGNOSIS — S72141A Displaced intertrochanteric fracture of right femur, initial encounter for closed fracture: Secondary | ICD-10-CM | POA: Diagnosis present

## 2014-02-14 DIAGNOSIS — R7302 Impaired glucose tolerance (oral): Secondary | ICD-10-CM | POA: Diagnosis present

## 2014-02-14 DIAGNOSIS — E869 Volume depletion, unspecified: Secondary | ICD-10-CM | POA: Diagnosis present

## 2014-02-14 DIAGNOSIS — E871 Hypo-osmolality and hyponatremia: Secondary | ICD-10-CM

## 2014-02-14 DIAGNOSIS — Z87891 Personal history of nicotine dependence: Secondary | ICD-10-CM

## 2014-02-14 DIAGNOSIS — R1310 Dysphagia, unspecified: Secondary | ICD-10-CM | POA: Diagnosis present

## 2014-02-14 DIAGNOSIS — Z993 Dependence on wheelchair: Secondary | ICD-10-CM | POA: Diagnosis not present

## 2014-02-14 DIAGNOSIS — S72009A Fracture of unspecified part of neck of unspecified femur, initial encounter for closed fracture: Secondary | ICD-10-CM | POA: Diagnosis present

## 2014-02-14 DIAGNOSIS — E876 Hypokalemia: Secondary | ICD-10-CM | POA: Diagnosis not present

## 2014-02-14 DIAGNOSIS — Z6827 Body mass index (BMI) 27.0-27.9, adult: Secondary | ICD-10-CM | POA: Diagnosis not present

## 2014-02-14 DIAGNOSIS — F419 Anxiety disorder, unspecified: Secondary | ICD-10-CM | POA: Diagnosis present

## 2014-02-14 DIAGNOSIS — Y92129 Unspecified place in nursing home as the place of occurrence of the external cause: Secondary | ICD-10-CM | POA: Diagnosis not present

## 2014-02-14 DIAGNOSIS — W07XXXA Fall from chair, initial encounter: Secondary | ICD-10-CM | POA: Diagnosis present

## 2014-02-14 DIAGNOSIS — R1314 Dysphagia, pharyngoesophageal phase: Secondary | ICD-10-CM

## 2014-02-14 DIAGNOSIS — Z8659 Personal history of other mental and behavioral disorders: Secondary | ICD-10-CM

## 2014-02-14 DIAGNOSIS — M25551 Pain in right hip: Secondary | ICD-10-CM | POA: Diagnosis present

## 2014-02-14 DIAGNOSIS — Z9010 Acquired absence of unspecified breast and nipple: Secondary | ICD-10-CM | POA: Diagnosis present

## 2014-02-14 LAB — CBC WITH DIFFERENTIAL/PLATELET
BASOS ABS: 0 10*3/uL (ref 0.0–0.1)
BASOS PCT: 0 % (ref 0–1)
EOS PCT: 1 % (ref 0–5)
Eosinophils Absolute: 0.1 10*3/uL (ref 0.0–0.7)
HCT: 40 % (ref 36.0–46.0)
Hemoglobin: 13.4 g/dL (ref 12.0–15.0)
Lymphocytes Relative: 11 % — ABNORMAL LOW (ref 12–46)
Lymphs Abs: 1.2 10*3/uL (ref 0.7–4.0)
MCH: 30.7 pg (ref 26.0–34.0)
MCHC: 33.5 g/dL (ref 30.0–36.0)
MCV: 91.7 fL (ref 78.0–100.0)
Monocytes Absolute: 0.5 10*3/uL (ref 0.1–1.0)
Monocytes Relative: 4 % (ref 3–12)
NEUTROS ABS: 9 10*3/uL — AB (ref 1.7–7.7)
Neutrophils Relative %: 84 % — ABNORMAL HIGH (ref 43–77)
Platelets: 245 10*3/uL (ref 150–400)
RBC: 4.36 MIL/uL (ref 3.87–5.11)
RDW: 14.1 % (ref 11.5–15.5)
WBC: 10.8 10*3/uL — ABNORMAL HIGH (ref 4.0–10.5)

## 2014-02-14 LAB — URINE MICROSCOPIC-ADD ON

## 2014-02-14 LAB — BASIC METABOLIC PANEL
ANION GAP: 12 (ref 5–15)
BUN: 13 mg/dL (ref 6–23)
CALCIUM: 9.1 mg/dL (ref 8.4–10.5)
CHLORIDE: 96 meq/L (ref 96–112)
CO2: 28 mEq/L (ref 19–32)
CREATININE: 0.92 mg/dL (ref 0.50–1.10)
GFR calc non Af Amer: 51 mL/min — ABNORMAL LOW (ref >90)
GFR, EST AFRICAN AMERICAN: 59 mL/min — AB (ref >90)
Glucose, Bld: 132 mg/dL — ABNORMAL HIGH (ref 70–99)
Potassium: 3.8 mEq/L (ref 3.7–5.3)
Sodium: 136 mEq/L — ABNORMAL LOW (ref 137–147)

## 2014-02-14 LAB — TYPE AND SCREEN
ABO/RH(D): B POS
Antibody Screen: NEGATIVE

## 2014-02-14 LAB — URINALYSIS, ROUTINE W REFLEX MICROSCOPIC
Bilirubin Urine: NEGATIVE
Glucose, UA: NEGATIVE mg/dL
Hgb urine dipstick: NEGATIVE
KETONES UR: NEGATIVE mg/dL
Nitrite: NEGATIVE
PROTEIN: NEGATIVE mg/dL
Specific Gravity, Urine: 1.013 (ref 1.005–1.030)
Urobilinogen, UA: 0.2 mg/dL (ref 0.0–1.0)
pH: 7.5 (ref 5.0–8.0)

## 2014-02-14 MED ORDER — FENTANYL CITRATE 0.05 MG/ML IJ SOLN: 100.0000 ug | Freq: Once | INTRAMUSCULAR | Status: DC

## 2014-02-14 MED ORDER — FENTANYL CITRATE 0.05 MG/ML IJ SOLN
100.0000 ug | Freq: Once | INTRAMUSCULAR | Status: AC
  Administered 2014-02-14: 100 ug via INTRAVENOUS
  Filled 2014-02-14: qty 2

## 2014-02-14 MED ORDER — MORPHINE SULFATE 4 MG/ML IJ SOLN
4.0000 mg | Freq: Once | INTRAMUSCULAR | Status: AC
  Administered 2014-02-14: 4 mg via INTRAVENOUS
  Filled 2014-02-14: qty 1

## 2014-02-14 NOTE — ED Notes (Signed)
Patient transported to X-ray 

## 2014-02-14 NOTE — ED Notes (Signed)
Bed: OH60 Expected date:  Expected time:  Means of arrival:  Comments: EMS/80 yo female with fall/hip pain/shortening and rotation

## 2014-02-14 NOTE — ED Provider Notes (Signed)
CSN: [id number]     Arrival date & time 02/14/14  2033 History   First MD Initiated Contact with Patient 02/14/14 2043     Chief Complaint  Patient presents with  . Fall  . Hip Injury    right    History is obtained from [name] Romp, LPN at [facility] skilled nursing as well as from patient's daughter and from paramedics  (Consider location/radiation/quality/duration/timing/severity/associated sxs/prior Treatment) Patient is a 80 y.o. female presenting with fall.  Fall   Patient fell out of a lounge chair 6:45 PM tonight, getting her head caught between chair and wall. She complains of right hip pain since the event. Pain is moderate to severe. Nonradiating. She was brought via EMS immobilized on long board with hard collar in place. Treated with fentanyl intravenously while en route. Patient is able to stand with assistance at baseline. Daughter reports she was doing well until tonight. Past Medical History  Diagnosis Date  . Allergic rhinitis 07/23/2012  . Unspecified malignant neoplasm of skin of other and unspecified parts of face 2012  . Malignant neoplasm of breast (female), unspecified site 75  . Neoplasm of unspecified nature of breast 2000  . Vitamin D deficiency 2009  . Other and unspecified hyperlipidemia 2005  . Anemia, unspecified 2005  . Anxiety state, unspecified 2005  . Depressive disorder, not elsewhere classified 2005  . Senile cataract, unspecified 2005  . Unspecified essential hypertension 2005  . Subdural hemorrhage 2007  . External hemorrhoids without mention of complication 6283  . Unspecified venous (peripheral) insufficiency 2001  . Disturbance of salivary secretion 2007  . GERD (gastroesophageal reflux disease) 2005  . Hypertonicity of bladder 2006  . Urinary tract infection, site not specified 2013  . Other seborrheic dermatitis 2007  . Osteoarthrosis, unspecified whether generalized or localized, unspecified site 2005  . Pain in joint, shoulder  region 2008  . Senile osteoporosis 2000  . Insomnia, unspecified 2005  . Abnormality of gait 2007  . Unspecified urinary incontinence 2005  . Vertebral fracture, closed 2006  . Closed fracture of sternum 2006  . Closed fracture of two ribs 2013    left 5,6th  . Pain in joint, shoulder region 10/21/2012  . Basal cell carcinoma, face 03/20/2013   Past Surgical History  Procedure Laterality Date  . Joint replacement Left     Knee  . Abdominal hysterectomy    . Breast surgery Right     Mastectomy  . Appendectomy    . Mohs surgery  2010    Nose  . Cyst excision Left 2011    Calcified cyst L shin  . Esophagogastroduodenoscopy endoscopy  07/11/2013    Dr. Watt Climes hiatus hernia   Family History  Problem Relation Age of Onset  . Heart disease Mother   . Pneumonia Father    History  Substance Use Topics  . Smoking status: Former Smoker    Quit date: 08/16/1950  . Smokeless tobacco: Never Used  . Alcohol Use: No   lives in skilled nursing facility DO NOT RESUSCITATE CODE STATUS OB History   Grav Para Term Preterm Abortions TAB SAB Ect Mult Living                 Review of Systems  Constitutional: Negative.   HENT: Negative.   Respiratory: Negative.   Cardiovascular: Negative.   Gastrointestinal: Negative.   Musculoskeletal: Positive for arthralgias and gait problem.       Right hip pain  Skin: Negative.  Neurological: Negative.   Psychiatric/Behavioral: Negative.   All other systems reviewed and are negative.     Allergies  Other and Sulfa antibiotics  Home Medications   Prior to Admission medications   Medication Sig Start Date End Date Taking? Authorizing Provider  acetaminophen (TYLENOL) 325 MG tablet Take 650 mg by mouth 2 (two) times daily. May have additional 650mg  daily as needed for pain relief    Historical Provider, MD  aspirin 81 MG tablet Take 81 mg by mouth daily. For CV protection    Historical Provider, MD  fexofenadine (ALLEGRA) 180 MG tablet Take  180 mg by mouth daily. For seasonal allergies    Historical Provider, MD  fluticasone (FLONASE) 50 MCG/ACT nasal spray Place into both nostrils daily.    Historical Provider, MD  LORazepam (ATIVAN) 0.5 MG tablet Take 0.5 mg by mouth. May have .5mg   Twice daily as needed  to reduce anxiety 01/16/13   Mardene Celeste, NP  Melatonin 5 MG TABS Take 1 tablet by mouth at bedtime. For insomnia    Historical Provider, MD  metoprolol succinate (TOPROL-XL) 25 MG 24 hr tablet Take 12.5 mg by mouth at bedtime. For HTN    Historical Provider, MD  omeprazole (PRILOSEC) 20 MG capsule Take 20 mg by mouth at bedtime. For swallowing dysfunction 07/25/13   Mardene Celeste, NP  PARoxetine (PAXIL) 20 MG tablet Take 20 mg by mouth at bedtime. For depression    Historical Provider, MD  sennosides-docusate sodium (SENOKOT-S) 8.6-50 MG tablet Take 1 tablet by mouth at bedtime. For constipation    Historical Provider, MD  trolamine salicylate (ASPERCREME) 10 % cream Apply 1 application topically 3 (three) times daily. Apply to both knees    Historical Provider, MD   BP 186/80  Pulse 72  Temp(Src) 96.6 F (35.9 C) (Axillary)  Resp 16  SpO2 99% Physical Exam  Nursing note and vitals reviewed. Constitutional:  Chronically ill-appearing  HENT:  Head: Normocephalic and atraumatic.  Eyes: Conjunctivae are normal. Pupils are equal, round, and reactive to light.  Neck: Neck supple. No tracheal deviation present. No thyromegaly present.  Cardiovascular: Normal rate and regular rhythm.   No murmur heard. Pulmonary/Chest: Effort normal and breath sounds normal.  Abdominal: Soft. Bowel sounds are normal. She exhibits no distension. There is no tenderness.  Musculoskeletal: Normal range of motion. She exhibits no edema and no tenderness.  Right lower extremity shortened and externally rotated. Tender right hip. DP pulses 2+ bilaterally. All other extremities without contusion abrasion or tenderness neurovascularly intact   Neurological: She is alert. Coordination normal.  Follow simple commands and moves all extremities. Cranial nerves II through XII grossly intact  Skin: Skin is warm and dry. No rash noted.  Psychiatric: She has a normal mood and affect.    ED Course  Procedures (including critical care time) Labs Review Labs Reviewed  CBC WITH DIFFERENTIAL  BASIC METABOLIC PANEL  URINALYSIS, ROUTINE W REFLEX MICROSCOPIC  TYPE AND SCREEN   2310 p.m. continues to complain of right hip pain after treatment with intravenous fentanyl. Morphine ordered. Imaging Review No results found.   EKG Interpretation   Date/Time:  Saturday February 14 2014 23:11:25 EDT Ventricular Rate:  79 PR Interval:  191 QRS Duration: 96 QT Interval:  429 QTC Calculation: 492 R Axis:   42 Text Interpretation:  Sinus rhythm Abnormal R-wave progression, early  transition Borderline T abnormalities, anterior leads Borderline prolonged  QT interval Baseline wander in lead(s) V1 V2 V3 V4  V5 V6 No significant  change since last tracing Confirmed by Almadelia Looman  MD, Harel Repetto [phone]) on  02/14/2014 11:14:16 PM     Last oral intake 6 PM tonight. Results for orders placed during the hospital encounter of 02/14/14  CBC WITH DIFFERENTIAL      Result Value Ref Range   WBC 10.8 (*) 4.0 - 10.5 K/uL   RBC 4.36  3.87 - 5.11 MIL/uL   Hemoglobin 13.4  12.0 - 15.0 g/dL   HCT 40.0  36.0 - 46.0 %   MCV 91.7  78.0 - 100.0 fL   MCH 30.7  26.0 - 34.0 pg   MCHC 33.5  30.0 - 36.0 g/dL   RDW 14.1  11.5 - 15.5 %   Platelets 245  150 - 400 K/uL   Neutrophils Relative % 84 (*) 43 - 77 %   Neutro Abs 9.0 (*) 1.7 - 7.7 K/uL   Lymphocytes Relative 11 (*) 12 - 46 %   Lymphs Abs 1.2  0.7 - 4.0 K/uL   Monocytes Relative 4  3 - 12 %   Monocytes Absolute 0.5  0.1 - 1.0 K/uL   Eosinophils Relative 1  0 - 5 %   Eosinophils Absolute 0.1  0.0 - 0.7 K/uL   Basophils Relative 0  0 - 1 %   Basophils Absolute 0.0  0.0 - 0.1 K/uL  BASIC METABOLIC PANEL       Result Value Ref Range   Sodium 136 (*) 137 - 147 mEq/L   Potassium 3.8  3.7 - 5.3 mEq/L   Chloride 96  96 - 112 mEq/L   CO2 28  19 - 32 mEq/L   Glucose, Bld 132 (*) 70 - 99 mg/dL   BUN 13  6 - 23 mg/dL   Creatinine, Ser 0.92  0.50 - 1.10 mg/dL   Calcium 9.1  8.4 - 10.5 mg/dL   GFR calc non Af Amer 51 (*) >90 mL/min   GFR calc Af Amer 59 (*) >90 mL/min   Anion gap 12  5 - 15  URINALYSIS, ROUTINE W REFLEX MICROSCOPIC      Result Value Ref Range   Color, Urine YELLOW  YELLOW   APPearance CLEAR  CLEAR   Specific Gravity, Urine 1.013  1.005 - 1.030   pH 7.5  5.0 - 8.0   Glucose, UA NEGATIVE  NEGATIVE mg/dL   Hgb urine dipstick NEGATIVE  NEGATIVE   Bilirubin Urine NEGATIVE  NEGATIVE   Ketones, ur NEGATIVE  NEGATIVE mg/dL   Protein, ur NEGATIVE  NEGATIVE mg/dL   Urobilinogen, UA 0.2  0.0 - 1.0 mg/dL   Nitrite NEGATIVE  NEGATIVE   Leukocytes, UA SMALL (*) NEGATIVE  URINE MICROSCOPIC-ADD ON      Result Value Ref Range   Squamous Epithelial / LPF RARE  RARE   WBC, UA 7-10  <3 WBC/hpf   Bacteria, UA RARE  RARE  TYPE AND SCREEN      Result Value Ref Range   ABO/RH(D) B POS     Antibody Screen NEG     Sample Expiration 02/17/2014     Dg Chest 1 View  02/14/2014   CLINICAL DATA:  Right hip fracture ; status post fall  EXAM: CHEST - 1 VIEW  COMPARISON:  October 31, 2005  FINDINGS: There is apparent scarring in the upper lobes bilaterally, a finding that has progressed compared to the prior study. Elsewhere lungs are clear. Heart size and pulmonary vascularity are normal. No adenopathy. There  are surgical clips in the right axilla with evidence of previous mastectomy on the right. No bone lesions. No pneumothorax.  IMPRESSION: Apparent scarring in the upper lobes, progressed compared to prior study. This distribution raises concern for potential chronic aspiration in the upper lobes. There is no frank edema or consolidation. No pneumothorax. No acute fracture.   Electronically Signed   By:  Lowella Grip M.D.   On: 02/14/2014 21:50   Dg Hip Complete Right  02/14/2014   CLINICAL DATA:  [place] out of chair  EXAM: RIGHT HIP - COMPLETE 2+ VIEW  COMPARISON:  None.  FINDINGS: There is a comminuted, intertrochanteric fracture involving the proximal right femur. No dislocation. The left hip and left proximal femur appear intact.  IMPRESSION: 1. Comminuted, intertrochanteric fracture involves the proximal right femur.   Electronically Signed   By: Kerby Moors M.D.   On: 02/14/2014 21:47   Ct Cervical Spine Wo Contrast  02/14/2014   CLINICAL DATA:  [place] from chair were without loss of consciousness, neck pain  EXAM: CT CERVICAL SPINE WITHOUT CONTRAST  TECHNIQUE: Multidetector CT imaging of the cervical spine was performed without intravenous contrast. Multiplanar CT image reconstructions were also generated.  COMPARISON:  12/22/2005  FINDINGS: Seven cervical segments are well visualized. Apparent congenital fusion of C3 and C4 is noted. Multilevel facet hypertrophic changes are noted. No acute fracture or acute facet abnormality is seen. Surrounding soft tissues are stable from the prior exam. A hypodensity is again noted in the right lobe of the thyroid stable from the previous exam. Given its stability it is likely benign in etiology. The visualized lung apices are within normal limits with some scarring in the right apex.  IMPRESSION: Multilevel degenerative change.  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   Electronically Signed   By: Inez Catalina M.D.   On: 02/14/2014 21:52     MDM   CT scan of cervical spine odered due to mechanism and I could not clear pts cspine clinically Spoke with Dr.Brooks who will act as surgical consultant. Spoke with Dr.Kakrakandy who will admit patient to medical surgical floor Final diagnoses:  Pain  Preop examination  Dx acute closed right intertrochanteric hip fracture      Orlie Dakin,  MD 02/14/14 2327

## 2014-02-14 NOTE — ED Notes (Signed)
Per edp Jacubowitz put in foley cath

## 2014-02-14 NOTE — ED Notes (Addendum)
Per EMS, pt. Is from [facility] reported of fall from chair and landed to her right hip on a hard wood floor,at 7pm this evening. no LOC reported. Reports of right hip pain with obvious shortening of the said extremity. Denied SOB. Pt . Received a total dose of 150mcg IV fentanyl, claimed releif of pain upon arrival to ED. C-collar in placed. Alert , oriented x3.

## 2014-02-14 NOTE — H&P (Addendum)
[facility] History and Physical  [name] CNO:[id number] DOB: [birth year] DOA: 02/14/2014  Referring physician: ER physician. PCP: Estill Dooms, MD   Chief Complaint: Fall with right hip pain.  HPI: [name] is a 80 y.o. female with history of hypertension and previous history of breast cancer status post mastectomy was brought to the ER after patient had a fall at her nursing home. As per patient's daughter patient was trying to reach the phone when patient fell. Patient did not lose her consciousness. In the ER CT C-spine was negative for any fractures and x-rays show right hip fracture. Patient is usually wheelchair-bound due to her osteoarthritis. Patient did not complain of any chest pain or shortness of breath did not have any nausea vomiting abdominal pain or diarrhea. Patient was recently treated for pneumonia last month.  Review of Systems: As presented in the history of presenting illness, rest negative.  Past Medical History  Diagnosis Date  . Allergic rhinitis 07/23/2012  . Unspecified malignant neoplasm of skin of other and unspecified parts of face 2012  . Malignant neoplasm of breast (female), unspecified site 13  . Neoplasm of unspecified nature of breast 2000  . Vitamin D deficiency 2009  . Other and unspecified hyperlipidemia 2005  . Anemia, unspecified 2005  . Anxiety state, unspecified 2005  . Depressive disorder, not elsewhere classified 2005  . Senile cataract, unspecified 2005  . Unspecified essential hypertension 2005  . Subdural hemorrhage 2007  . External hemorrhoids without mention of complication 2947  . Unspecified venous (peripheral) insufficiency 2001  . Disturbance of salivary secretion 2007  . GERD (gastroesophageal reflux disease) 2005  . Hypertonicity of bladder 2006  . Urinary tract infection, site not specified 2013  . Other seborrheic dermatitis 2007  . Osteoarthrosis, unspecified whether generalized or localized,  unspecified site 2005  . Pain in joint, shoulder region 2008  . Senile osteoporosis 2000  . Insomnia, unspecified 2005  . Abnormality of gait 2007  . Unspecified urinary incontinence 2005  . Vertebral fracture, closed 2006  . Closed fracture of sternum 2006  . Closed fracture of two ribs 2013    left 5,6th  . Pain in joint, shoulder region 10/21/2012  . Basal cell carcinoma, face 03/20/2013   Past Surgical History  Procedure Laterality Date  . Joint replacement Left     Knee  . Abdominal hysterectomy    . Breast surgery Right     Mastectomy  . Appendectomy    . Mohs surgery  2010    Nose  . Cyst excision Left 2011    Calcified cyst L shin  . Esophagogastroduodenoscopy endoscopy  07/11/2013    Dr. Watt Climes hiatus hernia   Social History:  reports that she quit smoking about 63 years ago. She has never used smokeless tobacco. She reports that she does not drink alcohol or use illicit drugs. Where does patient live nursing home. Can patient participate in ADLs? No.  Allergies  Allergen Reactions  . Other     "Epidural medication" per NH record  . Sulfa Antibiotics     Family History:  Family History  Problem Relation Age of Onset  . Heart disease Mother   . Pneumonia Father       Prior to Admission medications   Medication Sig Start Date End Date Taking? Authorizing Provider  DULoxetine (CYMBALTA) 30 MG capsule Take 30 mg by mouth at bedtime.   Yes Historical Provider, MD  furosemide (LASIX) 40 MG tablet Take  40 mg by mouth daily.   Yes Historical Provider, MD  ipratropium-albuterol (DUONEB) 0.5-2.5 (3) MG/3ML SOLN Take 3 mLs by nebulization every 6 (six) hours as needed (wheezing).   Yes Historical Provider, MD  LORazepam (ATIVAN) 0.5 MG tablet Take 0.5 mg by mouth 2 (two) times daily as needed (restlessness).   Yes Historical Provider, MD  nystatin (MYCOSTATIN/NYSTOP) 100000 UNIT/GM POWD Apply 1 Bottle topically 2 (two) times daily. To reddened area, bilateral groin unitl  11/5   Yes Historical Provider, MD  OVER THE COUNTER MEDICATION Apply 1 application topically 2 (two) times daily. Aveeno moisturizing - To lower legs/feet   Yes Historical Provider, MD  acetaminophen (TYLENOL) 325 MG tablet Take 650 mg by mouth. May have additional 650mg  daily as needed for pain relief    Historical Provider, MD  fluticasone (FLONASE) 50 MCG/ACT nasal spray Place 1 spray into both nostrils daily.     Historical Provider, MD  LORazepam (ATIVAN) 0.5 MG tablet Take 0.5 mg by mouth 2 (two) times daily.  01/16/13   Claudette Jeri Cos, NP  Melatonin 5 MG TABS Take 1 tablet by mouth at bedtime. For insomnia    Historical Provider, MD  metoprolol succinate (TOPROL-XL) 25 MG 24 hr tablet Take 12.5 mg by mouth at bedtime. For HTN    Historical Provider, MD  sennosides-docusate sodium (SENOKOT-S) 8.6-50 MG tablet Take 1 tablet by mouth at bedtime. For constipation    Historical Provider, MD  trolamine salicylate (ASPERCREME) 10 % cream Apply 1 application topically 3 (three) times daily. Apply to both knees    Historical Provider, MD    Physical Exam: Filed Vitals:   02/14/14 2228 02/14/14 2230 02/14/14 2300 02/14/14 2330  BP: 187/68 175/62 165/99 165/57  Pulse: 73 76 77 84  Temp:      TempSrc:      Resp: 17   15  SpO2: 92% 96% 100% 97%     General:  Moderately built and nourished.  Eyes: Anicteric no pallor.  ENT: No discharge from the ears eyes nose mouth.  Neck: No mass felt.  Cardiovascular: S1-S2 heard.  Respiratory: No rhonchi or crepitations.  Abdomen: Soft nontender bowel sounds present. No guarding or rigidity.  Skin: No rash.  Musculoskeletal: Pain on moving right hip.  Psychiatric: Appears normal.  Neurologic: Alert awake oriented to her name follows commands. Moves all extremities.  Labs on Admission:  Basic Metabolic Panel:  Recent Labs Lab 02/14/14 2159  NA 136*  K 3.8  CL 96  CO2 28  GLUCOSE 132*  BUN 13  CREATININE 0.92  CALCIUM 9.1    Liver Function Tests: No results found for this basename: AST, ALT, ALKPHOS, BILITOT, PROT, ALBUMIN,  in the last 168 hours No results found for this basename: LIPASE, AMYLASE,  in the last 168 hours No results found for this basename: AMMONIA,  in the last 168 hours CBC:  Recent Labs Lab 02/14/14 2159  WBC 10.8*  NEUTROABS 9.0*  HGB 13.4  HCT 40.0  MCV 91.7  PLT 245   Cardiac Enzymes: No results found for this basename: CKTOTAL, CKMB, CKMBINDEX, TROPONINI,  in the last 168 hours  BNP (last 3 results) No results found for this basename: PROBNP,  in the last 8760 hours CBG: No results found for this basename: GLUCAP,  in the last 168 hours  Radiological Exams on Admission: Dg Chest 1 View  02/14/2014   CLINICAL DATA:  Right hip fracture ; status post fall  EXAM: CHEST -  1 VIEW  COMPARISON:  October 31, 2005  FINDINGS: There is apparent scarring in the upper lobes bilaterally, a finding that has progressed compared to the prior study. Elsewhere lungs are clear. Heart size and pulmonary vascularity are normal. No adenopathy. There are surgical clips in the right axilla with evidence of previous mastectomy on the right. No bone lesions. No pneumothorax.  IMPRESSION: Apparent scarring in the upper lobes, progressed compared to prior study. This distribution raises concern for potential chronic aspiration in the upper lobes. There is no frank edema or consolidation. No pneumothorax. No acute fracture.   Electronically Signed   By: Lowella Grip M.D.   On: 02/14/2014 21:50   Dg Hip Complete Right  02/14/2014   CLINICAL DATA:  [place] out of chair  EXAM: RIGHT HIP - COMPLETE 2+ VIEW  COMPARISON:  None.  FINDINGS: There is a comminuted, intertrochanteric fracture involving the proximal right femur. No dislocation. The left hip and left proximal femur appear intact.  IMPRESSION: 1. Comminuted, intertrochanteric fracture involves the proximal right femur.   Electronically Signed   By: Kerby Moors M.D.   On: 02/14/2014 21:47   Ct Cervical Spine Wo Contrast  02/14/2014   CLINICAL DATA:  [place] from chair were without loss of consciousness, neck pain  EXAM: CT CERVICAL SPINE WITHOUT CONTRAST  TECHNIQUE: Multidetector CT imaging of the cervical spine was performed without intravenous contrast. Multiplanar CT image reconstructions were also generated.  COMPARISON:  12/22/2005  FINDINGS: Seven cervical segments are well visualized. Apparent congenital fusion of C3 and C4 is noted. Multilevel facet hypertrophic changes are noted. No acute fracture or acute facet abnormality is seen. Surrounding soft tissues are stable from the prior exam. A hypodensity is again noted in the right lobe of the thyroid stable from the previous exam. Given its stability it is likely benign in etiology. The visualized lung apices are within normal limits with some scarring in the right apex.  IMPRESSION: Multilevel degenerative change.  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   Electronically Signed   By: Inez Catalina M.D.   On: 02/14/2014 21:52    EKG: Independently reviewed. Normal sinus rhythm.  Assessment/Plan Principal Problem:   Closed right hip fracture Active Problems:   Essential hypertension   History of depression   History of breast cancer   Hip fracture   1. Right hip fracture status post fall - patient looks medically stable for surgery. I have discussed with Dr. Rolena Infante will be taking patient for surgery and patient will be kept nothing by mouth in anticipation of surgery. Continue pain relief medications. 2. Possible chronic aspiration - chest x-ray shows infiltrates and I have placed patient on Unasyn for now and check swallow evaluation. Patient was treated for pneumonia last month and as per patient's daughter patient was treated with antibiotics. 3. Hypertension - continue home medications. 4. History of depression and anxiety - continue home  medications. 5. History of breast cancer status post mastectomy.    Code Status: DO NOT RESUSCITATE.  Family Communication: Patient's daughter at the bedside.  Disposition Plan: Admit to inpatient.    Hermilo Dutter N. [facility] Pager [phone].  If 7PM-7AM, please contact night-coverage [email or web address] Password [email or web address] 02/14/2014, 11:51 PM

## 2014-02-15 ENCOUNTER — Encounter (HOSPITAL_COMMUNITY): Payer: Self-pay | Admitting: Certified Registered Nurse Anesthetist

## 2014-02-15 ENCOUNTER — Encounter (HOSPITAL_COMMUNITY)
Admission: EM | Disposition: A | Discharge status: Skilled Nursing Facility | Payer: Self-pay | Source: Home / Self Care | Attending: Internal Medicine

## 2014-02-15 ENCOUNTER — Inpatient Hospital Stay (HOSPITAL_COMMUNITY): Payer: Medicare Other

## 2014-02-15 DIAGNOSIS — E876 Hypokalemia: Secondary | ICD-10-CM

## 2014-02-15 DIAGNOSIS — S72001K Fracture of unspecified part of neck of right femur, subsequent encounter for closed fracture with nonunion: Secondary | ICD-10-CM

## 2014-02-15 DIAGNOSIS — E871 Hypo-osmolality and hyponatremia: Secondary | ICD-10-CM

## 2014-02-15 HISTORY — PX: INTRAMEDULLARY (IM) NAIL INTERTROCHANTERIC: SHX5875

## 2014-02-15 LAB — CBC WITH DIFFERENTIAL/PLATELET
BASOS PCT: 0 % (ref 0–1)
Basophils Absolute: 0 10*3/uL (ref 0.0–0.1)
EOS ABS: 0 10*3/uL (ref 0.0–0.7)
EOS PCT: 0 % (ref 0–5)
HCT: 35.8 % — ABNORMAL LOW (ref 36.0–46.0)
HEMOGLOBIN: 11.3 g/dL — AB (ref 12.0–15.0)
Lymphocytes Relative: 4 % — ABNORMAL LOW (ref 12–46)
Lymphs Abs: 0.5 10*3/uL — ABNORMAL LOW (ref 0.7–4.0)
MCH: 29.6 pg (ref 26.0–34.0)
MCHC: 31.6 g/dL (ref 30.0–36.0)
MCV: 93.7 fL (ref 78.0–100.0)
MONOS PCT: 1 % — AB (ref 3–12)
Monocytes Absolute: 0.2 10*3/uL (ref 0.1–1.0)
NEUTROS PCT: 95 % — AB (ref 43–77)
Neutro Abs: 12.4 10*3/uL — ABNORMAL HIGH (ref 1.7–7.7)
Platelets: 195 10*3/uL (ref 150–400)
RBC: 3.82 MIL/uL — ABNORMAL LOW (ref 3.87–5.11)
RDW: 14.2 % (ref 11.5–15.5)
WBC: 13.1 10*3/uL — ABNORMAL HIGH (ref 4.0–10.5)

## 2014-02-15 LAB — MAGNESIUM: Magnesium: 1.8 mg/dL (ref 1.5–2.5)

## 2014-02-15 LAB — GLUCOSE, CAPILLARY
Glucose-Capillary: 201 mg/dL — ABNORMAL HIGH (ref 70–99)
Glucose-Capillary: 170 mg/dL — ABNORMAL HIGH (ref 70–99)

## 2014-02-15 LAB — COMPREHENSIVE METABOLIC PANEL
ALK PHOS: 57 U/L (ref 39–117)
ALT: 12 U/L (ref 0–35)
AST: 19 U/L (ref 0–37)
Albumin: 3.1 g/dL — ABNORMAL LOW (ref 3.5–5.2)
Anion gap: 16 — ABNORMAL HIGH (ref 5–15)
BUN: 10 mg/dL (ref 6–23)
CO2: 21 meq/L (ref 19–32)
Calcium: 8.8 mg/dL (ref 8.4–10.5)
Chloride: 95 mEq/L — ABNORMAL LOW (ref 96–112)
Creatinine, Ser: 0.73 mg/dL (ref 0.50–1.10)
GFR calc Af Amer: 82 mL/min — ABNORMAL LOW (ref >90)
GFR, EST NON AFRICAN AMERICAN: 70 mL/min — AB (ref >90)
Glucose, Bld: 159 mg/dL — ABNORMAL HIGH (ref 70–99)
POTASSIUM: 3.4 meq/L — AB (ref 3.7–5.3)
SODIUM: 132 meq/L — AB (ref 137–147)
Total Bilirubin: 0.3 mg/dL (ref 0.3–1.2)
Total Protein: 7 g/dL (ref 6.0–8.3)

## 2014-02-15 LAB — ABO/RH: ABO/RH(D): B POS

## 2014-02-15 LAB — SURGICAL PCR SCREEN
MRSA, PCR: NEGATIVE
Staphylococcus aureus: POSITIVE — AB

## 2014-02-15 SURGERY — FIXATION, FRACTURE, INTERTROCHANTERIC, WITH INTRAMEDULLARY ROD
Anesthesia: General | Laterality: Right

## 2014-02-15 MED ORDER — OXYCODONE HCL ER 10 MG PO T12A
10.0000 mg | EXTENDED_RELEASE_TABLET | Freq: Two times a day (BID) | ORAL | Status: DC
  Administered 2014-02-15 – 2014-02-17 (×5): 10 mg via ORAL
  Filled 2014-02-15 (×5): qty 1

## 2014-02-15 MED ORDER — METHOCARBAMOL 500 MG PO TABS
500.0000 mg | ORAL_TABLET | Freq: Four times a day (QID) | ORAL | Status: DC | PRN
  Administered 2014-02-17 (×2): 500 mg via ORAL
  Filled 2014-02-15 (×2): qty 1

## 2014-02-15 MED ORDER — SODIUM CHLORIDE 0.9 % IV SOLN
INTRAVENOUS | Status: DC
Start: 2014-02-15 — End: 2014-02-15

## 2014-02-15 MED ORDER — PHENYLEPHRINE HCL 10 MG/ML IJ SOLN
INTRAMUSCULAR | Status: AC
  Filled 2014-02-15: qty 1

## 2014-02-15 MED ORDER — MELATONIN 5 MG PO TABS: 1.0000 | ORAL_TABLET | Freq: Every day | ORAL | Status: DC

## 2014-02-15 MED ORDER — CEFAZOLIN SODIUM-DEXTROSE 2-3 GM-% IV SOLR: 2.0000 g | Freq: Once | INTRAVENOUS | Status: DC

## 2014-02-15 MED ORDER — IPRATROPIUM-ALBUTEROL 0.5-2.5 (3) MG/3ML IN SOLN: 3.0000 mL | Freq: Four times a day (QID) | RESPIRATORY_TRACT | Status: DC | PRN

## 2014-02-15 MED ORDER — HYDROCODONE-ACETAMINOPHEN 5-325 MG PO TABS: 1.0000 | ORAL_TABLET | Freq: Four times a day (QID) | ORAL | Status: DC | PRN

## 2014-02-15 MED ORDER — FENTANYL CITRATE 0.05 MG/ML IJ SOLN
INTRAMUSCULAR | Status: AC
  Filled 2014-02-15: qty 5

## 2014-02-15 MED ORDER — ONDANSETRON HCL 4 MG/2ML IJ SOLN: 4.0000 mg | Freq: Four times a day (QID) | INTRAMUSCULAR | Status: DC | PRN

## 2014-02-15 MED ORDER — BOOST / RESOURCE BREEZE PO LIQD
1.0000 | Freq: Three times a day (TID) | ORAL | Status: DC
  Administered 2014-02-15: 20:00:00 via ORAL
  Administered 2014-02-15 – 2014-02-17 (×6): 1 via ORAL

## 2014-02-15 MED ORDER — ACETAMINOPHEN 500 MG PO TABS
1000.0000 mg | ORAL_TABLET | Freq: Four times a day (QID) | ORAL | Status: DC
  Administered 2014-02-15 – 2014-02-17 (×9): 1000 mg via ORAL
  Filled 2014-02-15 (×14): qty 2

## 2014-02-15 MED ORDER — CHLORHEXIDINE GLUCONATE CLOTH 2 % EX PADS
6.0000 | MEDICATED_PAD | Freq: Every day | CUTANEOUS | Status: DC
  Administered 2014-02-15 – 2014-02-17 (×3): 6 via TOPICAL

## 2014-02-15 MED ORDER — HYDROMORPHONE HCL 1 MG/ML IJ SOLN: 0.5000 mg | INTRAMUSCULAR | Status: DC | PRN

## 2014-02-15 MED ORDER — FLUTICASONE PROPIONATE 50 MCG/ACT NA SUSP
1.0000 | Freq: Every day | NASAL | Status: DC
  Administered 2014-02-15 – 2014-02-17 (×3): 1 via NASAL
  Filled 2014-02-15: qty 16

## 2014-02-15 MED ORDER — POTASSIUM CHLORIDE CRYS ER 20 MEQ PO TBCR
20.0000 meq | EXTENDED_RELEASE_TABLET | Freq: Once | ORAL | Status: AC
  Administered 2014-02-15: 20 meq via ORAL
  Filled 2014-02-15 (×2): qty 1

## 2014-02-15 MED ORDER — DOCUSATE SODIUM 100 MG PO CAPS
100.0000 mg | ORAL_CAPSULE | Freq: Two times a day (BID) | ORAL | Status: DC
  Administered 2014-02-15 – 2014-02-17 (×5): 100 mg via ORAL

## 2014-02-15 MED ORDER — FLEET ENEMA 7-19 GM/118ML RE ENEM: 1.0000 | ENEMA | Freq: Every day | RECTAL | Status: DC | PRN

## 2014-02-15 MED ORDER — FERROUS SULFATE 325 (65 FE) MG PO TABS
325.0000 mg | ORAL_TABLET | Freq: Three times a day (TID) | ORAL | Status: DC
  Administered 2014-02-15 – 2014-02-17 (×9): 325 mg via ORAL
  Filled 2014-02-15 (×10): qty 1

## 2014-02-15 MED ORDER — CEFAZOLIN SODIUM-DEXTROSE 2-3 GM-% IV SOLR
INTRAVENOUS | Status: AC
  Filled 2014-02-15: qty 50

## 2014-02-15 MED ORDER — DEXTROSE 5 % IV SOLN
500.0000 mg | Freq: Four times a day (QID) | INTRAVENOUS | Status: DC | PRN
  Administered 2014-02-15: 500 mg via INTRAVENOUS
  Filled 2014-02-15 (×3): qty 5

## 2014-02-15 MED ORDER — OXYCODONE HCL 5 MG PO TABS
5.0000 mg | ORAL_TABLET | ORAL | Status: DC | PRN
  Administered 2014-02-15: 5 mg via ORAL
  Administered 2014-02-16: 10 mg via ORAL
  Administered 2014-02-17 (×2): 5 mg via ORAL
  Filled 2014-02-15: qty 2
  Filled 2014-02-15: qty 1
  Filled 2014-02-15: qty 2
  Filled 2014-02-15: qty 1

## 2014-02-15 MED ORDER — PROPOFOL 10 MG/ML IV BOLUS
INTRAVENOUS | Status: AC
  Filled 2014-02-15: qty 20

## 2014-02-15 MED ORDER — CEFAZOLIN SODIUM 1-5 GM-% IV SOLN
1.0000 g | Freq: Three times a day (TID) | INTRAVENOUS | Status: AC
  Administered 2014-02-15 (×2): 1 g via INTRAVENOUS
  Filled 2014-02-15 (×2): qty 50

## 2014-02-15 MED ORDER — LORAZEPAM 0.5 MG PO TABS
0.5000 mg | ORAL_TABLET | Freq: Two times a day (BID) | ORAL | Status: DC | PRN
  Administered 2014-02-15 – 2014-02-17 (×2): 0.5 mg via ORAL
  Filled 2014-02-15 (×2): qty 1

## 2014-02-15 MED ORDER — LACTATED RINGERS IV SOLN: INTRAVENOUS | Status: DC

## 2014-02-15 MED ORDER — METOPROLOL SUCCINATE 12.5 MG HALF TABLET
12.5000 mg | ORAL_TABLET | Freq: Every day | ORAL | Status: DC
  Administered 2014-02-15 – 2014-02-16 (×2): 12.5 mg via ORAL
  Filled 2014-02-15 (×3): qty 1

## 2014-02-15 MED ORDER — SENNOSIDES-DOCUSATE SODIUM 8.6-50 MG PO TABS
1.0000 | ORAL_TABLET | Freq: Every day | ORAL | Status: DC
  Administered 2014-02-15 – 2014-02-16 (×2): 1 via ORAL
  Filled 2014-02-15: qty 1

## 2014-02-15 MED ORDER — DULOXETINE HCL 30 MG PO CPEP
30.0000 mg | ORAL_CAPSULE | Freq: Every day | ORAL | Status: DC
  Administered 2014-02-15 – 2014-02-16 (×2): 30 mg via ORAL
  Filled 2014-02-15 (×3): qty 1

## 2014-02-15 MED ORDER — SODIUM CHLORIDE 0.9 % IV SOLN
3.0000 g | Freq: Four times a day (QID) | INTRAVENOUS | Status: DC
  Administered 2014-02-15 – 2014-02-17 (×8): 3 g via INTRAVENOUS
  Filled 2014-02-15 (×11): qty 3

## 2014-02-15 MED ORDER — MUPIROCIN 2 % EX OINT
1.0000 "application " | TOPICAL_OINTMENT | Freq: Two times a day (BID) | CUTANEOUS | Status: DC
  Administered 2014-02-15 – 2014-02-17 (×4): 1 via NASAL
  Filled 2014-02-15: qty 22

## 2014-02-15 MED ORDER — MORPHINE SULFATE 2 MG/ML IJ SOLN: 0.5000 mg | INTRAMUSCULAR | Status: DC | PRN

## 2014-02-15 MED ORDER — BUPIVACAINE HCL (PF) 0.25 % IJ SOLN
INTRAMUSCULAR | Status: AC
  Filled 2014-02-15: qty 30

## 2014-02-15 MED ORDER — SODIUM CHLORIDE 0.9 % IV SOLN
INTRAVENOUS | Status: DC
  Administered 2014-02-15: 13:00:00 via INTRAVENOUS
  Filled 2014-02-15 (×4): qty 1000

## 2014-02-15 MED ORDER — ENOXAPARIN SODIUM 40 MG/0.4ML ~~LOC~~ SOLN
40.0000 mg | SUBCUTANEOUS | Status: DC
  Administered 2014-02-16 – 2014-02-17 (×2): 40 mg via SUBCUTANEOUS
  Filled 2014-02-15 (×2): qty 0.4

## 2014-02-15 MED ORDER — ENSURE PUDDING PO PUDG
1.0000 | Freq: Three times a day (TID) | ORAL | Status: DC
  Filled 2014-02-15 (×3): qty 1

## 2014-02-15 SURGICAL SUPPLY — 40 items
BAG SPEC THK2 15X12 ZIP CLS (MISCELLANEOUS) ×1
BAG ZIPLOCK 12X15 (MISCELLANEOUS) ×3 IMPLANT
BIT DRILL CANN LG 4.3MM (BIT) IMPLANT
COVER MAYO STAND STRL (DRAPES) ×3 IMPLANT
COVER SURGICAL LIGHT HANDLE (MISCELLANEOUS) ×3 IMPLANT
DRAPE BACK TABLE (DRAPES) ×3 IMPLANT
DRAPE STERI IOBAN 125X83 (DRAPES) ×3 IMPLANT
DRILL BIT CANN LG 4.3MM (BIT) ×3
DRSG AQUACEL AG ADV 3.5X10 (GAUZE/BANDAGES/DRESSINGS) ×2 IMPLANT
DRSG MEPILEX BORDER 4X4 (GAUZE/BANDAGES/DRESSINGS) ×6 IMPLANT
DRSG PAD ABDOMINAL 8X10 ST (GAUZE/BANDAGES/DRESSINGS) ×12 IMPLANT
DURAPREP 26ML APPLICATOR (WOUND CARE) ×3 IMPLANT
ELECT REM PT RETURN 9FT ADLT (ELECTROSURGICAL) ×3
ELECTRODE REM PT RTRN 9FT ADLT (ELECTROSURGICAL) ×1 IMPLANT
GAUZE SPONGE 4X4 12PLY STRL (GAUZE/BANDAGES/DRESSINGS) ×3 IMPLANT
GLOVE BIOGEL PI IND STRL 8.5 (GLOVE) ×1 IMPLANT
GLOVE BIOGEL PI INDICATOR 8.5 (GLOVE) ×2
GLOVE ECLIPSE 8.5 STRL (GLOVE) ×3 IMPLANT
GOWN STRL REUS W/TWL 2XL LVL3 (GOWN DISPOSABLE) ×3 IMPLANT
GOWN STRL REUS W/TWL XL LVL3 (GOWN DISPOSABLE) ×3 IMPLANT
GUIDEPIN 3.2X17.5 THRD DISP (PIN) ×2 IMPLANT
HFN 125 DEG 9MM X 180MM (Nail) ×2 IMPLANT
HIP FRAC NAIL LAG SCR 10.5X100 (Orthopedic Implant) ×2 IMPLANT
KIT BASIN OR (CUSTOM PROCEDURE TRAY) ×3 IMPLANT
MANIFOLD NEPTUNE II (INSTRUMENTS) ×3 IMPLANT
NEEDLE HYPO 22GX1.5 SAFETY (NEEDLE) ×3 IMPLANT
PACK GENERAL/GYN (CUSTOM PROCEDURE TRAY) ×3 IMPLANT
PAD CAST 4YDX4 CTTN HI CHSV (CAST SUPPLIES) ×1 IMPLANT
PADDING CAST COTTON 4X4 STRL (CAST SUPPLIES) ×3
PADDING CAST SYN 6 (CAST SUPPLIES) ×2
PADDING CAST SYNTHETIC 6X4 NS (CAST SUPPLIES) ×1 IMPLANT
POSITIONER SURGICAL ARM (MISCELLANEOUS) ×9 IMPLANT
SCREW BONE CORTICAL 5.0X36 (Screw) ×2 IMPLANT
SCREW CANN THRD AFF 10.5X100 (Orthopedic Implant) IMPLANT
STAPLER VISISTAT 35W (STAPLE) ×3 IMPLANT
SUT BONE WAX W31G (SUTURE) ×3 IMPLANT
SUT VIC AB 1 CT1 36 (SUTURE) ×6 IMPLANT
SUT VIC AB 2-0 CT1 18 (SUTURE) ×3 IMPLANT
SYR CONTROL 10ML LL (SYRINGE) ×3 IMPLANT
TOWEL OR 17X26 10 PK STRL BLUE (TOWEL DISPOSABLE) ×9 IMPLANT

## 2014-02-15 NOTE — Progress Notes (Addendum)
PROGRESS NOTE  [name] JSH:[id number] DOB: Aug 08, 1916 DOA: 02/14/2014 PCP: Estill Dooms, MD  Brief history 80 year old female with a history of anxiety, hypertension, subdural hematoma, breast cancer presents after a mechanical fall at [facility]. X-rays in the emergency department revealed a comminuted right intertrochanteric femur fracture. The patient is normally wheelchair bound. The patient was seen by orthopedics, Dr. Rolena Infante.  She underwent ORIF with placement of an IM nail on 02/15/2014.  The patient recently recovered from an episode of pneumonia after being treated with oral antibiotics. She has a history of chronic dysphagia.  Assessment/Plan: Comminuted right intertrochanteric femur fracture -Status post ORIF 02/15/14, Dr. Rolena Infante -PT/OT -anticipate return to SNF -pain control -DVT prophylaxis per ortho Dysphagia with chronic aspiration -speech therapy evaluation -empiric unasyn -dysphagia 3 diet HTN -elevated BP partly due to pain -continue metoprolol succinate-did not receive on 02/14/14 Anxiety/Depression -continue Cymbalta Hypokalemia -replete -check mag Hyponatremia -appears volume depleted -IV NS Family Communication:   Daughter updated at beside Disposition Plan:   SNF when cleared by ortho       Procedures/Studies: Dg Chest 1 View  02/14/2014   CLINICAL DATA:  Right hip fracture ; status post fall  EXAM: CHEST - 1 VIEW  COMPARISON:  October 31, 2005  FINDINGS: There is apparent scarring in the upper lobes bilaterally, a finding that has progressed compared to the prior study. Elsewhere lungs are clear. Heart size and pulmonary vascularity are normal. No adenopathy. There are surgical clips in the right axilla with evidence of previous mastectomy on the right. No bone lesions. No pneumothorax.  IMPRESSION: Apparent scarring in the upper lobes, progressed compared to prior study. This distribution raises concern for potential chronic  aspiration in the upper lobes. There is no frank edema or consolidation. No pneumothorax. No acute fracture.   Electronically Signed   By: Lowella Grip M.D.   On: 02/14/2014 21:50   Dg Hip Complete Right  02/15/2014   CLINICAL DATA:  Right hip fracture.  Subsequent encounter.  EXAM: DG OPERATIVE right HIP 1-2 VIEWS  TECHNIQUE: Fluoroscopic spot image(s) were submitted for interpretation post-operatively.  COMPARISON:  None.  FINDINGS: Multiple spot films show placement of a screw and intramedullary rod transfixing the intertrochanteric right hip fracture in near anatomic alignment. No other fracture or dislocation identified.  IMPRESSION: Internal fixation of right hip fracture in near anatomic alignment.   Electronically Signed   By: Earle Gell M.D.   On: 02/15/2014 10:53   Dg Hip Complete Right  02/14/2014   CLINICAL DATA:  [place] out of chair  EXAM: RIGHT HIP - COMPLETE 2+ VIEW  COMPARISON:  None.  FINDINGS: There is a comminuted, intertrochanteric fracture involving the proximal right femur. No dislocation. The left hip and left proximal femur appear intact.  IMPRESSION: 1. Comminuted, intertrochanteric fracture involves the proximal right femur.   Electronically Signed   By: Kerby Moors M.D.   On: 02/14/2014 21:47   Ct Cervical Spine Wo Contrast  02/14/2014   CLINICAL DATA:  [place] from chair were without loss of consciousness, neck pain  EXAM: CT CERVICAL SPINE WITHOUT CONTRAST  TECHNIQUE: Multidetector CT imaging of the cervical spine was performed without intravenous contrast. Multiplanar CT image reconstructions were also generated.  COMPARISON:  12/22/2005  FINDINGS: Seven cervical segments are well visualized. Apparent congenital fusion of C3 and C4 is noted. Multilevel facet hypertrophic changes are noted. No acute fracture or acute facet abnormality is  seen. Surrounding soft tissues are stable from the prior exam. A hypodensity is again noted in the right lobe of the thyroid stable from  the previous exam. Given its stability it is likely benign in etiology. The visualized lung apices are within normal limits with some scarring in the right apex.  IMPRESSION: Multilevel degenerative change.  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   Electronically Signed   By: Inez Catalina M.D.   On: 02/14/2014 21:52   Dg C-arm 1-60 Min-no Report  02/15/2014   CLINICAL DATA:  Right hip fracture.  Subsequent encounter.  EXAM: DG OPERATIVE right HIP 1-2 VIEWS  TECHNIQUE: Fluoroscopic spot image(s) were submitted for interpretation post-operatively.  COMPARISON:  None.  FINDINGS: Multiple spot films show placement of a screw and intramedullary rod transfixing the intertrochanteric right hip fracture in near anatomic alignment. No other fracture or dislocation identified.  IMPRESSION: Internal fixation of right hip fracture in near anatomic alignment.   Electronically Signed   By: Earle Gell M.D.   On: 02/15/2014 10:53         Subjective: Patient denies fevers, chills, headache, chest pain, dyspnea, nausea, vomiting, diarrhea, abdominal pain   Objective: Filed Vitals:   02/15/14 0800 02/15/14 1021 02/15/14 1057 02/15/14 1105  BP:  150/69    Pulse:  107    Temp:  97.5 F (36.4 C)    TempSrc:  Oral    Resp: 16 16    Weight:      SpO2: 100% 99% 99% 100%    Intake/Output Summary (Last 24 hours) at 02/15/14 1152 Last data filed at 02/15/14 1050  Gross per 24 hour  Intake   1200 ml  Output   2250 ml  Net  -1050 ml   Weight change:  Exam:   General:  Pt is alert, follows commands appropriately, not in acute distress  HEENT: No icterus, No thrush, Nomeningismus,[place]/AT  Cardiovascular: RRR, S1/S2, no rubs, no gallops  Respiratory: Bibasilar crackles. No wheezing. Good air movement  Abdomen: Soft/+BS, non tender, non distended, no guarding  Extremities: No edema, No lymphangitis, No petechiae, No rashes, no synovitis  Data  Reviewed: Basic Metabolic Panel:  Recent Labs Lab 02/14/14 2159 02/15/14 0541  NA 136* 132*  K 3.8 3.4*  CL 96 95*  CO2 28 21  GLUCOSE 132* 159*  BUN 13 10  CREATININE 0.92 0.73  CALCIUM 9.1 8.8   Liver Function Tests:  Recent Labs Lab 02/15/14 0541  AST 19  ALT 12  ALKPHOS 57  BILITOT 0.3  PROT 7.0  ALBUMIN 3.1*   No results for input(s): LIPASE, AMYLASE in the last 168 hours. No results for input(s): AMMONIA in the last 168 hours. CBC:  Recent Labs Lab 02/14/14 2159 02/15/14 0541  WBC 10.8* 13.1*  NEUTROABS 9.0* 12.4*  HGB 13.4 11.3*  HCT 40.0 35.8*  MCV 91.7 93.7  PLT 245 195   Cardiac Enzymes: No results for input(s): CKTOTAL, CKMB, CKMBINDEX, TROPONINI in the last 168 hours. BNP: Invalid input(s): POCBNP CBG: No results for input(s): GLUCAP in the last 168 hours.  Recent Results (from the past 240 hour(s))  Surgical PCR screen     Status: Abnormal   Collection Time: 02/15/14  4:54 AM  Result Value Ref Range Status   MRSA, PCR NEGATIVE NEGATIVE Final   Staphylococcus aureus POSITIVE (A) NEGATIVE Final    Comment:        The Xpert SA  Assay (FDA approved for NASAL specimens in patients over 46 years of age), is one component of a comprehensive surveillance program.  Test performance has been validated by [employer] for patients greater than or equal to 101 year old. It is not intended to diagnose infection nor to guide or monitor treatment.      Scheduled Meds: . acetaminophen  1,000 mg Oral 4 times per day  . ampicillin-sulbactam (UNASYN) IV  3 g Intravenous Q6H  .  ceFAZolin (ANCEF) IV  1 g Intravenous 3 times per day  . docusate sodium  100 mg Oral BID  . DULoxetine  30 mg Oral QHS  . [START ON 02/16/2014] enoxaparin (LOVENOX) injection  40 mg Subcutaneous Q24H  . ferrous sulfate  325 mg Oral TID WC  . fluticasone  1 spray Each Nare Daily  . metoprolol succinate  12.5 mg Oral QHS  . OxyCODONE  10 mg Oral Q12H  . senna-docusate  1  tablet Oral QHS   Continuous Infusions:    Kinneth Fujiwara, DO  [facility] Pager [phone]  If 7PM-7AM, please contact night-coverage [email or web address] Password [email or web address] 02/15/2014, 11:52 AM   LOS: 1 day

## 2014-02-15 NOTE — Plan of Care (Signed)
Problem: Phase I Progression Outcomes Goal: Post op pain controlled with appropriate interventions Outcome: Completed/Met Date Met:  02/15/14 Goal: Post op CMS/Neurovascular status WDL Outcome: Completed/Met Date Met:  02/15/14 Goal: Post op clear liquids, advance diet as tolerated Outcome: Completed/Met Date Met:  02/15/14 Goal: Other Phase I Outcomes/Goals Outcome: Not Applicable Date Met:  18/40/37

## 2014-02-15 NOTE — Progress Notes (Signed)
Speech Language Pathology     Patient Details Name: [name] MRN: [id number] DOB: 12-May-1916 Today's Date: 02/15/2014 Time:  -    Received order for swallow assessment.  Pt. Has history of chronic esophageal dysphagia.  Documentation reveals EGD performed 07/11/13 with dilation.  MD changed diet from solids to clear liquids as daughter stated pt. "does best with thin" (per note). SLP spoke with RN who has not detected observable difficulty.  Agree with diet change to clears.  If s/s aspiration arise, recommend thick liquids at that time and ST will follow up next date.  Orbie Pyo [place].[employer] [phone]

## 2014-02-15 NOTE — Consult Note (Signed)
[name], [name] Spare, MD Chief Complaint: Right hip pain History: Patient fell out of a lounge chair 6:45 PM tonight, getting her head caught between chair and wall. She complains of right hip pain since the event. Pain is moderate to severe. Nonradiating. She was brought via EMS immobilized on long board with hard collar in place. Treated with fentanyl intravenously while en route. Patient is able to stand with assistance at baseline. Daughter reports she was doing well until tonight.  Past Medical History  Diagnosis Date  . Allergic rhinitis 07/23/2012  . Unspecified malignant neoplasm of skin of other and unspecified parts of face 2012  . Malignant neoplasm of breast (female), unspecified site 54  . Neoplasm of unspecified nature of breast 2000  . Vitamin D deficiency 2009  . Other and unspecified hyperlipidemia 2005  . Anemia, unspecified 2005  . Anxiety state, unspecified 2005  . Depressive disorder, not elsewhere classified 2005  . Senile cataract, unspecified 2005  . Unspecified essential hypertension 2005  . Subdural hemorrhage 2007  . External hemorrhoids without mention of complication 6295  . Unspecified venous (peripheral) insufficiency 2001  . Disturbance of salivary secretion 2007  . GERD (gastroesophageal reflux disease) 2005  . Hypertonicity of bladder 2006  . Urinary tract infection, site not specified 2013  . Other seborrheic dermatitis 2007  . Osteoarthrosis, unspecified whether generalized or localized, unspecified site 2005  . Pain in joint, shoulder region 2008  . Senile osteoporosis 2000  . Insomnia, unspecified 2005  . Abnormality of gait 2007  . Unspecified urinary incontinence 2005  . Vertebral fracture, closed 2006  . Closed fracture of sternum 2006  . Closed fracture of two ribs 2013    left 5,6th  . Pain in joint, shoulder region 10/21/2012  . Basal cell carcinoma, face 03/20/2013    Allergies  Allergen Reactions  . Other     "Epidural medication" per  [facility] record  . Sulfa Antibiotics     No current facility-administered medications on file prior to encounter.   Current Outpatient Prescriptions on File Prior to Encounter  Medication Sig Dispense Refill  . acetaminophen (TYLENOL) 325 MG tablet Take 650 mg by mouth. May have additional 650mg  daily as needed for pain relief      . fluticasone (FLONASE) 50 MCG/ACT nasal spray Place 1 spray into both nostrils daily.       Marland Kitchen LORazepam (ATIVAN) 0.5 MG tablet Take 0.5 mg by mouth 2 (two) times daily.       . Melatonin 5 MG TABS Take 1 tablet by mouth at bedtime. For insomnia      . metoprolol succinate (TOPROL-XL) 25 MG 24 hr tablet Take 12.5 mg by mouth at bedtime. For HTN      . sennosides-docusate sodium (SENOKOT-S) 8.6-50 MG tablet Take 1 tablet by mouth at bedtime. For constipation      . trolamine salicylate (ASPERCREME) 10 % cream Apply 1 application topically 3 (three) times daily. Apply to both knees        Physical Exam: Filed Vitals:   02/14/14 2330  BP: 165/57  Pulse: 84  Temp:   Resp: 15  A+O X3 NVI Compartments soft/NT  No sob/cp Abd soft/NT EHL/TA/GA intact 1+ DP/PT pulses No knee/ankle pain/swelling  Right hip pain with palpation.  Positive rotational deformity   Image: Dg Chest 1 View  02/14/2014   CLINICAL DATA:  Right hip fracture ; status post fall  EXAM: CHEST - 1 VIEW  COMPARISON:  October 31, 2005  FINDINGS: There is apparent scarring in the upper lobes bilaterally, a finding that has progressed compared to the prior study. Elsewhere lungs are clear. Heart size and pulmonary vascularity are normal. No adenopathy. There are surgical clips in the right axilla with evidence of previous mastectomy on the right. No bone lesions. No pneumothorax.  IMPRESSION: Apparent scarring in the upper lobes, progressed compared to prior study. This distribution raises concern for potential chronic aspiration in the upper lobes. There is no frank edema or consolidation. No  pneumothorax. No acute fracture.   Electronically Signed   By: Lowella Grip M.D.   On: 02/14/2014 21:50   Dg Hip Complete Right  02/14/2014   CLINICAL DATA:  [place] out of chair  EXAM: RIGHT HIP - COMPLETE 2+ VIEW  COMPARISON:  None.  FINDINGS: There is a comminuted, intertrochanteric fracture involving the proximal right femur. No dislocation. The left hip and left proximal femur appear intact.  IMPRESSION: 1. Comminuted, intertrochanteric fracture involves the proximal right femur.   Electronically Signed   By: Kerby Moors M.D.   On: 02/14/2014 21:47   Ct Cervical Spine Wo Contrast  02/14/2014   CLINICAL DATA:  [place] from chair were without loss of consciousness, neck pain  EXAM: CT CERVICAL SPINE WITHOUT CONTRAST  TECHNIQUE: Multidetector CT imaging of the cervical spine was performed without intravenous contrast. Multiplanar CT image reconstructions were also generated.  COMPARISON:  12/22/2005  FINDINGS: Seven cervical segments are well visualized. Apparent congenital fusion of C3 and C4 is noted. Multilevel facet hypertrophic changes are noted. No acute fracture or acute facet abnormality is seen. Surrounding soft tissues are stable from the prior exam. A hypodensity is again noted in the right lobe of the thyroid stable from the previous exam. Given its stability it is likely benign in etiology. The visualized lung apices are within normal limits with some scarring in the right apex.  IMPRESSION: Multilevel degenerative change.  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   Electronically Signed   By: Inez Catalina M.D.   On: 02/14/2014 21:52    A/P: Elderly otherwise healthy female who fell at 645pm.  Unable to ambulate. Xrays demonstrate displaced right intertroch fracture. Risks of surgery discussed with patient and daughter - include non-union, mal-union, bleeding,blood clots, death, stroke, paralysis, post-traumatic arthritis, and need for  further surgery. Plan on surgery Sunday at [facility] since she ate at 6pm.  Early fixation will allow for mobilization and better pulmonary toilet.   Patient has been cleared by Medical team.

## 2014-02-15 NOTE — Progress Notes (Signed)
Subjective: C/o some right hip pain after surgery. Daughter present states that patient has hx of chronic swallowing issues.  usually does well with clear liquid diet.     Objective: Vital signs in last 24 hours: Temp:  [96.6 F (35.9 C)-98.8 F (37.1 C)] 97.5 F (36.4 C) (11/01 1021) Pulse Rate:  [72-107] 107 (11/01 1021) Resp:  [12-20] 16 (11/01 1021) BP: (150-213)/(43-99) 150/69 mmHg (11/01 1021) SpO2:  [92 %-100 %] 100 % (11/01 1105) Weight:  [73.483 kg (162 lb)] 73.483 kg (162 lb) (11/01 0007)  Intake/Output from previous day: 10/31 0701 - 11/01 0700 In: 1200  Out: 850 [Urine:800; Blood:50] Intake/Output this shift: Total I/O In: -  Out: 1400 [Urine:1400]   Recent Labs  02/14/14 2159 02/15/14 0541  HGB 13.4 11.3*    Recent Labs  02/14/14 2159 02/15/14 0541  WBC 10.8* 13.1*  RBC 4.36 3.82*  HCT 40.0 35.8*  PLT 245 195    Recent Labs  02/14/14 2159 02/15/14 0541  NA 136* 132*  K 3.8 3.4*  CL 96 95*  CO2 28 21  BUN 13 10  CREATININE 0.92 0.73  GLUCOSE 132* 159*  CALCIUM 9.1 8.8   No results for input(s): LABPT, INR in the last 72 hours.  Dressing C/D/I.  Calf nt, nvi.    Assessment/Plan: Will change to clear liquid diet.  Ordered ensure.  Continue present care.  Will eventually need transfer back to snf when stable.     Essense Bousquet M 02/15/2014, 12:08 PM

## 2014-02-15 NOTE — Op Note (Signed)
NAMEMORRISSA, [name] NO.:  [id number]  MEDICAL RECORD NO.:  [id number]  LOCATION:  25                         FACILITY:  [facility]  PHYSICIAN:  Dahlia Bailiff, MD    DATE OF BIRTH:  [birth year]  DATE OF PROCEDURE:  02/15/2014 DATE OF DISCHARGE:                              OPERATIVE REPORT   PREOPERATIVE DIAGNOSIS:  Right intertrochanteric hip fracture.  POSTOPERATIVE DIAGNOSIS:  Right intertrochanteric hip fracture.  OPERATIVE PROCEDURE:  Intramedullary trochanteric nail for right hip fracture.  COMPLICATIONS:  None.  SYSTEM USED:  Biomet 125-degree angle regular nail with a 36 mm locking screw distally and a 100 mm locking screw proximally.  HISTORY:  This is a very pleasant 80 year old woman who presented to the ER after a fall at a nursing home.  She is unable to weightbear and was brought to the emergency room and diagnosed with a hip fracture.  After obtaining preoperative medical clearance, she was taken to the OR this morning at 2 a.m. for the aforementioned procedure.  All appropriate risks, benefits, and alternatives were explained to the patient and to her daughter.  Consent was obtained.  OPERATIVE NOTE:  The patient was brought to the operating room, placed supine on the operating table.  After successful induction of general anesthesia and endotracheal intubation, the right lower extremity which was marked in the holding area was placed in the well-leg holder and the right lower extremity was placed in the traction boot and the left lower extremity was placed in a well leg holder.  Time-out was taken to confirm the patient, procedure, and all other pertinent important data. A gentle reduction maneuver was performed and the leg was prepped and draped in a standard fashion.  I confirmed satisfactory reduction of the fracture with AP and lateral fluoroscopy.  A 1.5-inch incision was made proximal to the greater trochanter and a guide pin was  advanced down to the tip of the trocar.  The guide pin was advanced through the tip of the trochanter and into the proximal femur.  I confirmed satisfactory position in both planes.  The one-step reamer was then placed over the guide pin and then the nail was placed down without resistance.  Once it was properly fit, I confirmed satisfactory reduction in the AP and lateral planes.  I made a second incision same length on the midportion of the thigh on the lateral side and advanced the lag screw, targeting device.  Once this was down, I advanced the guide pin and confirmed satisfactory position in both planes.  Once this was done, I drilled the screw hole to 100 and then placed the lag screw.  This also occurred without complication.  I then locked it down and then reverse turned a quarter.  I then placed the distal locking screw through the targeting device.  I removed the targeting apparatus and took final AP and lateral views of the femur.  The hip was reduced.  The fracture was reduced and the hardware was in good position in both planes.  I then irrigated both wounds copiously with normal saline, closed the deep fascia with interrupted #1 Vicryl suture, superficial with 2-0 Vicryl  suture and staples for the skin.  Marcaine 0.25%, 10 mL was then used to anesthetize the area for postoperative analgesia.  Dry dressings were applied and the patient was extubated, transferred to the PACU without incident.  At the end of the case, all needle and sponge counts were correct.  There were no adverse intraoperative events.     Dahlia Bailiff, MD     DDB/MEDQ  D:  02/15/2014  T:  02/15/2014  Job:  [id number]

## 2014-02-15 NOTE — Progress Notes (Signed)
ANTIBIOTIC CONSULT NOTE - INITIAL  Pharmacy Consult for Unasyn Indication: pneumonia  Allergies  Allergen Reactions  . Other     "Epidural medication" per [facility] record  . Sulfa Antibiotics     Patient Measurements:   Adjusted Body Weight:   Vital Signs: Temp: 98.5 F (36.9 C) (11/01 0610) Temp Source: Oral (11/01 0610) BP: 157/65 mmHg (11/01 0610) Pulse Rate: 90 (11/01 0610) Intake/Output from previous day: 10/31 0701 - 11/01 0700 In: 1200  Out: 850 [Urine:800; Blood:50] Intake/Output from this shift: Total I/O In: 1200 [Other:1200] Out: 850 [Urine:800; Blood:50]  Labs:  Recent Labs  02/14/14 2159 02/15/14 0541  WBC 10.8* 13.1*  HGB 13.4 11.3*  PLT 245 195  CREATININE 0.92 0.73   CrCl cannot be calculated (Unknown ideal weight.). No results for input(s): VANCOTROUGH, VANCOPEAK, VANCORANDOM, GENTTROUGH, GENTPEAK, GENTRANDOM, TOBRATROUGH, TOBRAPEAK, TOBRARND, AMIKACINPEAK, AMIKACINTROU, AMIKACIN in the last 72 hours.   Microbiology: No results found for this or any previous visit (from the past 720 hour(s)).  Medical History: Past Medical History  Diagnosis Date  . Allergic rhinitis 07/23/2012  . Unspecified malignant neoplasm of skin of other and unspecified parts of face 2012  . Malignant neoplasm of breast (female), unspecified site 70  . Neoplasm of unspecified nature of breast 2000  . Vitamin D deficiency 2009  . Other and unspecified hyperlipidemia 2005  . Anemia, unspecified 2005  . Anxiety state, unspecified 2005  . Depressive disorder, not elsewhere classified 2005  . Senile cataract, unspecified 2005  . Unspecified essential hypertension 2005  . Subdural hemorrhage 2007  . External hemorrhoids without mention of complication 8299  . Unspecified venous (peripheral) insufficiency 2001  . Disturbance of salivary secretion 2007  . GERD (gastroesophageal reflux disease) 2005  . Hypertonicity of bladder 2006  . Urinary tract infection, site not  specified 2013  . Other seborrheic dermatitis 2007  . Osteoarthrosis, unspecified whether generalized or localized, unspecified site 2005  . Pain in joint, shoulder region 2008  . Senile osteoporosis 2000  . Insomnia, unspecified 2005  . Abnormality of gait 2007  . Unspecified urinary incontinence 2005  . Vertebral fracture, closed 2006  . Closed fracture of sternum 2006  . Closed fracture of two ribs 2013    left 5,6th  . Pain in joint, shoulder region 10/21/2012  . Basal cell carcinoma, face 03/20/2013    Medications:  Anti-infectives    Start     Dose/Rate Route Frequency Ordered Stop   02/15/14 0600  ceFAZolin (ANCEF) IVPB 1 g/50 mL premix     1 g100 mL/hr over 30 Minutes Intravenous 3 times per day 02/15/14 0428 02/15/14 2159   02/15/14 0600  Ampicillin-Sulbactam (UNASYN) 3 g in sodium chloride 0.9 % 100 mL IVPB     3 g100 mL/hr over 60 Minutes Intravenous Every 6 hours 02/15/14 0521     02/15/14 0015  ceFAZolin (ANCEF) IVPB 2 g/50 mL premix  Status:  Discontinued     2 g100 mL/hr over 30 Minutes Intravenous  Once 02/15/14 0014 02/15/14 0459     Assessment: Patient with possible aspiration PNA.  Current ht/wt only est. By RN, renal function appears good for age.  Goal of Therapy:  Unasyn dosed based on patient weight and renal function   Plan:  Follow up culture results  Unasyn 3gm iv q6hr Will follow up with ht/wt and adjust dose if needed.  Tyler Deis, Shea Stakes Crowford 02/15/2014,6:33 AM

## 2014-02-16 DIAGNOSIS — R1314 Dysphagia, pharyngoesophageal phase: Secondary | ICD-10-CM

## 2014-02-16 DIAGNOSIS — S72001D Fracture of unspecified part of neck of right femur, subsequent encounter for closed fracture with routine healing: Secondary | ICD-10-CM

## 2014-02-16 LAB — BASIC METABOLIC PANEL
ANION GAP: 12 (ref 5–15)
BUN: 10 mg/dL (ref 6–23)
CO2: 24 meq/L (ref 19–32)
Calcium: 8 mg/dL — ABNORMAL LOW (ref 8.4–10.5)
Chloride: 101 mEq/L (ref 96–112)
Creatinine, Ser: 0.91 mg/dL (ref 0.50–1.10)
GFR calc Af Amer: 60 mL/min — ABNORMAL LOW (ref >90)
GFR, EST NON AFRICAN AMERICAN: 52 mL/min — AB (ref >90)
Glucose, Bld: 148 mg/dL — ABNORMAL HIGH (ref 70–99)
Potassium: 3.1 mEq/L — ABNORMAL LOW (ref 3.7–5.3)
SODIUM: 137 meq/L (ref 137–147)

## 2014-02-16 LAB — CBC
HCT: 26.9 % — ABNORMAL LOW (ref 36.0–46.0)
HEMOGLOBIN: 8.9 g/dL — AB (ref 12.0–15.0)
MCH: 30.7 pg (ref 26.0–34.0)
MCHC: 33.1 g/dL (ref 30.0–36.0)
MCV: 92.8 fL (ref 78.0–100.0)
Platelets: 194 10*3/uL (ref 150–400)
RBC: 2.9 MIL/uL — ABNORMAL LOW (ref 3.87–5.11)
RDW: 14.5 % (ref 11.5–15.5)
WBC: 7.3 10*3/uL (ref 4.0–10.5)

## 2014-02-16 LAB — GLUCOSE, CAPILLARY
GLUCOSE-CAPILLARY: 112 mg/dL — AB (ref 70–99)
GLUCOSE-CAPILLARY: 162 mg/dL — AB (ref 70–99)
Glucose-Capillary: 161 mg/dL — ABNORMAL HIGH (ref 70–99)
Glucose-Capillary: 92 mg/dL (ref 70–99)
Glucose-Capillary: 108 mg/dL — ABNORMAL HIGH (ref 70–99)

## 2014-02-16 LAB — MAGNESIUM: MAGNESIUM: 1.7 mg/dL (ref 1.5–2.5)

## 2014-02-16 LAB — VITAMIN D 25 HYDROXY (VIT D DEFICIENCY, FRACTURES): VIT D 25 HYDROXY: 30 ng/mL (ref 30–89)

## 2014-02-16 MED ORDER — POTASSIUM CHLORIDE CRYS ER 20 MEQ PO TBCR
40.0000 meq | EXTENDED_RELEASE_TABLET | Freq: Once | ORAL | Status: AC
  Administered 2014-02-16: 40 meq via ORAL
  Filled 2014-02-16 (×2): qty 2

## 2014-02-16 NOTE — Progress Notes (Signed)
INITIAL NUTRITION ASSESSMENT  DOCUMENTATION CODES Per approved criteria  -Not Applicable   INTERVENTION:  Continue to provide Resource Breeze po TID, each supplement provides 250 kcal and 9 grams of protein  RD to continue to monitor  NUTRITION DIAGNOSIS: Increased nutrient (protein) needs related to recent surgery, healing as evidenced by estimated nutritional needs.   Goal: Pt to meet >/= 90% of their estimated nutrition needs   Monitor:  PO and supplemental intake, weight, labs, I/O's  Reason for Assessment: Hip Fracture Protocol  Admitting Dx: Closed right hip fracture  ASSESSMENT: 80 year old female with a history of anxiety, hypertension, subdural hematoma, breast cancer presents after a mechanical fall at [facility].She has a history of chronic dysphagia.  SLP in room at time of visit. Pt with Hx of dysphagia. Per SLP note: Recommend dysphagia 3 diet with thin liquids. 11/1: ORIF with placement of an IM nail   Per weight history documentation, pt has lost 28 lb since March (17% wt loss x 8 months, this is significant for time frame).   Pt has been receiving Resource Breeze supplements TID, pt is drinking them.   Pt is at nutritional risk and RD suspects malnutrition given history of dysphagia and significant amount of weight loss x 8 months.  Unable to perform nutrition-focused physical exam at this time d/t swallow eval.  Labs reviewed: Low K Glucose: 148  Height: Ht Readings from Last 1 Encounters:  02/16/14 5' (1.524 m)    Weight: Wt Readings from Last 1 Encounters:  02/16/14 140 lb (63.504 kg)    Ideal Body Weight: 100 lb  % Ideal Body Weight: 140%  Wt Readings from Last 10 Encounters:  02/16/14 140 lb (63.504 kg)  01/16/14 156 lb (70.761 kg)  11/11/13 162 lb (73.483 kg)  08/29/13 163 lb 4.8 oz (74.072 kg)  07/25/13 164 lb 8 oz (74.617 kg)  06/17/13 168 lb 14.4 oz (76.613 kg)  04/04/13 174 lb 6.4 oz (79.107 kg)  11/29/12 176 lb (79.833  kg)  10/21/12 173 lb 8 oz (78.699 kg)  08/15/12 169 lb 1.6 oz (76.703 kg)    Usual Body Weight: 170 lb  % Usual Body Weight: 82%  BMI:  Body mass index is 27.34 kg/(m^2).  Estimated Nutritional Needs: Kcal: 1600-1800 Protein: 85-95g Fluid: 1.6L/day  Skin: right hip incision  Diet Order: DIET DYS 3   EDUCATION NEEDS: -No education needs identified at this time   Intake/Output Summary (Last 24 hours) at 02/16/14 1346 Last data filed at 02/16/14 1000  Gross per 24 hour  Intake   2235 ml  Output    970 ml  Net   1265 ml    Last BM: 10/31  Labs:   Recent Labs Lab 02/14/14 2159 02/15/14 0541 02/16/14 0511  NA 136* 132* 137  K 3.8 3.4* 3.1*  CL 96 95* 101  CO2 28 21 24   BUN 13 10 10   CREATININE 0.92 0.73 0.91  CALCIUM 9.1 8.8 8.0*  MG  --  1.8 1.7  GLUCOSE 132* 159* 148*    CBG (last 3)   Recent Labs  02/16/14 0433 02/16/14 0824 02/16/14 1314  GLUCAP 161* 92 108*    Scheduled Meds: . acetaminophen  1,000 mg Oral 4 times per day  . ampicillin-sulbactam (UNASYN) IV  3 g Intravenous Q6H  . Chlorhexidine Gluconate Cloth  6 each Topical Daily  . docusate sodium  100 mg Oral BID  . DULoxetine  30 mg Oral QHS  . enoxaparin (LOVENOX) injection  40 mg Subcutaneous Q24H  . feeding supplement (RESOURCE BREEZE)  1 Container Oral TID BM  . ferrous sulfate  325 mg Oral TID WC  . fluticasone  1 spray Each Nare Daily  . metoprolol succinate  12.5 mg Oral QHS  . mupirocin ointment  1 application Nasal BID  . OxyCODONE  10 mg Oral Q12H  . senna-docusate  1 tablet Oral QHS    Continuous Infusions:   Past Medical History  Diagnosis Date  . Allergic rhinitis 07/23/2012  . Unspecified malignant neoplasm of skin of other and unspecified parts of face 2012  . Malignant neoplasm of breast (female), unspecified site 66  . Neoplasm of unspecified nature of breast 2000  . Vitamin D deficiency 2009  . Other and unspecified hyperlipidemia 2005  . Anemia,  unspecified 2005  . Anxiety state, unspecified 2005  . Depressive disorder, not elsewhere classified 2005  . Senile cataract, unspecified 2005  . Unspecified essential hypertension 2005  . Subdural hemorrhage 2007  . External hemorrhoids without mention of complication 4982  . Unspecified venous (peripheral) insufficiency 2001  . Disturbance of salivary secretion 2007  . GERD (gastroesophageal reflux disease) 2005  . Hypertonicity of bladder 2006  . Urinary tract infection, site not specified 2013  . Other seborrheic dermatitis 2007  . Osteoarthrosis, unspecified whether generalized or localized, unspecified site 2005  . Pain in joint, shoulder region 2008  . Senile osteoporosis 2000  . Insomnia, unspecified 2005  . Abnormality of gait 2007  . Unspecified urinary incontinence 2005  . Vertebral fracture, closed 2006  . Closed fracture of sternum 2006  . Closed fracture of two ribs 2013    left 5,6th  . Pain in joint, shoulder region 10/21/2012  . Basal cell carcinoma, face 03/20/2013    Past Surgical History  Procedure Laterality Date  . Joint replacement Left     Knee  . Abdominal hysterectomy    . Breast surgery Right     Mastectomy  . Appendectomy    . Mohs surgery  2010    Nose  . Cyst excision Left 2011    Calcified cyst L shin  . Esophagogastroduodenoscopy endoscopy  07/11/2013    Dr. Watt Climes hiatus hernia    Clayton Bibles, MS, RD, LDN Pager: [phone] After Hours Pager: [phone]

## 2014-02-16 NOTE — Progress Notes (Signed)
Will leave FOLEY cath in this am until after PT eval later this AM as pt is w/c bound at SNF and must be lifted with equipment similar to [employer], as described by daughter at bedside and placed on commode in order to toilet. Stand pivot????? "Unable to bear weight prior to Hip fracture"

## 2014-02-16 NOTE — Discharge Summary (Signed)
Physician Discharge Summary  [name]:[id number] DOB: February 09, 1917 DOA: 02/14/2014  PCP: Estill Dooms, MD  Admit date: 02/14/2014 Discharge date: 02/17/14 Recommendations for Outpatient Follow-up:  1. Pt will need to follow up with PCP in 2 weeks post discharge 2. Please obtain BMP and CBC in one week  Discharge Diagnoses:  Comminuted right intertrochanteric femur fracture -Status post ORIF 02/15/14, Dr. Rolena Infante -PT/OT-->return to SNF -pain controlled -DVT prophylaxis per ortho -normally WC bound at baseline Dysphagia with chronic aspiration -speech therapy evaluation--pending -empiric unasyn -plan to send the patient back to the nursing facility with Augmentin 4 additional days which will complete 7 days of therapy. -pt initially placed on dys 3 diet on 02/15/14--then one daughter wanted changed to clears -02/16/14--different daughter now concerned about "choking" on clear liquids and wants diet changed--cough improved with chin tuck technique -02/16/2014--speech therapy evaluation agreed with dysphagia 3 diet with thin liquids HTN -elevated BP partly due to pain--BP has been labile -continue metoprolol succinate-did not receive on 02/14/14 Impaired glucose tolerance -Discontinue all CBG checks -Given current recommendations and the patient's age--allow for liberal glycemic control Anxiety/Depression -continue Cymbalta Hypokalemia -repleted -check mag--1.7 Hyponatremia -appears volume depleted -IV NS--improved  Discharge Condition: stable  Disposition: SNF  Diet:dysphagia 3 with thin liquids Wt Readings from Last 3 Encounters:  02/16/14 63.504 kg (140 lb)  01/16/14 70.761 kg (156 lb)  11/11/13 73.483 kg (162 lb)    History of present illness:  80 year old female with a history of anxiety, hypertension, subdural hematoma, breast cancer presents after a mechanical fall at [facility]. X-rays in the emergency department revealed a comminuted right  intertrochanteric femur fracture. The patient is normally wheelchair bound. The patient was seen by orthopedics, Dr. Rolena Infante. She underwent ORIF with placement of an IM nail on 02/15/2014. The patient recently recovered from an episode of pneumonia after being treated with oral antibiotics. She has a history of chronic dysphagia. The patient remained afebrile and hemodynamically stable postoperatively.the patient's pain remained controlled. The patient was noted to have hyponatremia which was likely due to volume depletion. She was started on intravenous fluids which improved her sodium. She was evaluated by speech therapy who recommended a dysphagia 3 diet with thin liquids.she was evaluated by physical therapy whom recommended that she return to the skilled nursing facility.  The patient remained clinically stable on room air without any respiratory distress.    Consultants: Ortho--[facility]  Discharge Exam: Filed Vitals:   02/17/14 0800  BP:   Pulse:   Temp:   Resp: 16   Filed Vitals:   02/16/14 1600 02/16/14 2105 02/17/14 0532 02/17/14 0800  BP:  124/46 155/57   Pulse:  87 86   Temp:  98.7 F (37.1 C) 98 F (36.7 C)   TempSrc:  Oral Oral   Resp: 16 16 18 16   Height:      Weight:      SpO2: 92% 93% 90% 96%   General: Awake and alert, NAD, pleasant, cooperative Cardiovascular: RRR, no rub, no gallop, no S3 Respiratory: bibasilar crackles. No wheezing. Good Air movement Abdomen:soft, nontender, nondistended, positive bowel sounds Extremities: No edema, No lymphangitis, no petechiae  Discharge Instructions      Discharge Instructions    Diet - low sodium heart healthy    Complete by:  As directed      Increase activity slowly    Complete by:  As directed             Medication List  TAKE these medications        acetaminophen 325 MG tablet  Commonly known as:  TYLENOL  Take 650 mg by mouth. May have additional 650mg  daily as needed for pain relief      amoxicillin-clavulanate 875-125 MG per tablet  Commonly known as:  AUGMENTIN  Take 1 tablet by mouth 2 (two) times daily.     DULoxetine 30 MG capsule  Commonly known as:  CYMBALTA  Take 30 mg by mouth at bedtime.     feeding supplement (RESOURCE BREEZE) Liqd  Take 1 Container by mouth 3 (three) times daily between meals.     fluticasone 50 MCG/ACT nasal spray  Commonly known as:  FLONASE  Place 1 spray into both nostrils daily.     furosemide 40 MG tablet  Commonly known as:  LASIX  Take 40 mg by mouth daily.     ipratropium-albuterol 0.5-2.5 (3) MG/3ML Soln  Commonly known as:  DUONEB  Take 3 mLs by nebulization every 6 (six) hours as needed (wheezing).     LORazepam 0.5 MG tablet  Commonly known as:  ATIVAN  Take 0.5 mg by mouth 2 (two) times daily as needed (restlessness).     LORazepam 0.5 MG tablet  Commonly known as:  ATIVAN  Take 0.5 mg by mouth 2 (two) times daily.     Melatonin 5 MG Tabs  Take 1 tablet by mouth at bedtime. For insomnia     methocarbamol 500 MG tablet  Commonly known as:  ROBAXIN  Take 1 tablet (500 mg total) by mouth every 6 (six) hours as needed for muscle spasms.     metoprolol succinate 25 MG 24 hr tablet  Commonly known as:  TOPROL-XL  Take 12.5 mg by mouth at bedtime. For HTN     nystatin 100000 UNIT/GM Powd  Apply 1 Bottle topically 2 (two) times daily. To reddened area, bilateral groin unitl 11/5     OVER THE COUNTER MEDICATION  Apply 1 application topically 2 (two) times daily. Aveeno moisturizing - To lower legs/feet     oxyCODONE 5 MG immediate release tablet  Commonly known as:  Oxy IR/ROXICODONE  Take 1-2 tablets (5-10 mg total) by mouth every 4 (four) hours as needed for moderate pain, severe pain or breakthrough pain.     sennosides-docusate sodium 8.6-50 MG tablet  Commonly known as:  SENOKOT-S  Take 1 tablet by mouth at bedtime. For constipation     trolamine salicylate 10 % cream  Commonly known as:  ASPERCREME    Apply 1 application topically 3 (three) times daily. Apply to both knees         The results of significant diagnostics from this hospitalization (including imaging, microbiology, ancillary and laboratory) are listed below for reference.    Significant Diagnostic Studies: Dg Chest 1 View  02/14/2014   CLINICAL DATA:  Right hip fracture ; status post fall  EXAM: CHEST - 1 VIEW  COMPARISON:  October 31, 2005  FINDINGS: There is apparent scarring in the upper lobes bilaterally, a finding that has progressed compared to the prior study. Elsewhere lungs are clear. Heart size and pulmonary vascularity are normal. No adenopathy. There are surgical clips in the right axilla with evidence of previous mastectomy on the right. No bone lesions. No pneumothorax.  IMPRESSION: Apparent scarring in the upper lobes, progressed compared to prior study. This distribution raises concern for potential chronic aspiration in the upper lobes. There is no frank edema or consolidation. No pneumothorax. No  acute fracture.   Electronically Signed   By: Lowella Grip M.D.   On: 02/14/2014 21:50   Dg Hip Complete Right  02/15/2014   CLINICAL DATA:  Right hip fracture.  Subsequent encounter.  EXAM: DG OPERATIVE right HIP 1-2 VIEWS  TECHNIQUE: Fluoroscopic spot image(s) were submitted for interpretation post-operatively.  COMPARISON:  None.  FINDINGS: Multiple spot films show placement of a screw and intramedullary rod transfixing the intertrochanteric right hip fracture in near anatomic alignment. No other fracture or dislocation identified.  IMPRESSION: Internal fixation of right hip fracture in near anatomic alignment.   Electronically Signed   By: Earle Gell M.D.   On: 02/15/2014 10:53   Dg Hip Complete Right  02/14/2014   CLINICAL DATA:  [place] out of chair  EXAM: RIGHT HIP - COMPLETE 2+ VIEW  COMPARISON:  None.  FINDINGS: There is a comminuted, intertrochanteric fracture involving the proximal right femur. No dislocation.  The left hip and left proximal femur appear intact.  IMPRESSION: 1. Comminuted, intertrochanteric fracture involves the proximal right femur.   Electronically Signed   By: Kerby Moors M.D.   On: 02/14/2014 21:47   Ct Cervical Spine Wo Contrast  02/14/2014   CLINICAL DATA:  [place] from chair were without loss of consciousness, neck pain  EXAM: CT CERVICAL SPINE WITHOUT CONTRAST  TECHNIQUE: Multidetector CT imaging of the cervical spine was performed without intravenous contrast. Multiplanar CT image reconstructions were also generated.  COMPARISON:  12/22/2005  FINDINGS: Seven cervical segments are well visualized. Apparent congenital fusion of C3 and C4 is noted. Multilevel facet hypertrophic changes are noted. No acute fracture or acute facet abnormality is seen. Surrounding soft tissues are stable from the prior exam. A hypodensity is again noted in the right lobe of the thyroid stable from the previous exam. Given its stability it is likely benign in etiology. The visualized lung apices are within normal limits with some scarring in the right apex.  IMPRESSION: Multilevel degenerative change.  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   Electronically Signed   By: Inez Catalina M.D.   On: 02/14/2014 21:52   Dg C-arm 1-60 Min-no Report  02/15/2014   CLINICAL DATA:  Right hip fracture.  Subsequent encounter.  EXAM: DG OPERATIVE right HIP 1-2 VIEWS  TECHNIQUE: Fluoroscopic spot image(s) were submitted for interpretation post-operatively.  COMPARISON:  None.  FINDINGS: Multiple spot films show placement of a screw and intramedullary rod transfixing the intertrochanteric right hip fracture in near anatomic alignment. No other fracture or dislocation identified.  IMPRESSION: Internal fixation of right hip fracture in near anatomic alignment.   Electronically Signed   By: Earle Gell M.D.   On: 02/15/2014 10:53     Microbiology: Recent Results (from the past 240  hour(s))  Surgical PCR screen     Status: Abnormal   Collection Time: 02/15/14  4:54 AM  Result Value Ref Range Status   MRSA, PCR NEGATIVE NEGATIVE Final   Staphylococcus aureus POSITIVE (A) NEGATIVE Final    Comment:        The Xpert SA Assay (FDA approved for NASAL specimens in patients over 30 years of age), is one component of a comprehensive surveillance program.  Test performance has been validated by [employer] for patients greater than or equal to 38 year old. It is not intended to diagnose infection nor to guide or monitor treatment.   Urine culture     Status:  None   Collection Time: 02/15/14 10:30 AM  Result Value Ref Range Status   Specimen Description URINE, CLEAN CATCH  Final   Special Requests NONE  Final   Culture  Setup Time   Final    02/15/2014 23:29 Performed at [place] Performed at [facility]   Final   Culture NO GROWTH Performed at [facility]   Final   Report Status 02/17/2014 FINAL  Final     Labs: Basic Metabolic Panel:  Recent Labs Lab 02/14/14 2159 02/15/14 0541 02/16/14 0511 02/17/14 0444  NA 136* 132* 137 139  K 3.8 3.4* 3.1* 3.8  CL 96 95* 101 103  CO2 28 21 24 25   GLUCOSE 132* 159* 148* 100*  BUN 13 10 10 12   CREATININE 0.92 0.73 0.91 0.88  CALCIUM 9.1 8.8 8.0* 8.6  MG  --  1.8 1.7  --    Liver Function Tests:  Recent Labs Lab 02/15/14 0541  AST 19  ALT 12  ALKPHOS 57  BILITOT 0.3  PROT 7.0  ALBUMIN 3.1*   No results for input(s): LIPASE, AMYLASE in the last 168 hours. No results for input(s): AMMONIA in the last 168 hours. CBC:  Recent Labs Lab 02/14/14 2159 02/15/14 0541 02/16/14 0511  WBC 10.8* 13.1* 7.3  NEUTROABS 9.0* 12.4*  --   HGB 13.4 11.3* 8.9*  HCT 40.0 35.8* 26.9*  MCV 91.7 93.7 92.8  PLT 245 195 194   Cardiac Enzymes: No results for input(s): CKTOTAL, CKMB, CKMBINDEX, TROPONINI in the last 168 hours. BNP: Invalid input(s):  POCBNP CBG:  Recent Labs Lab 02/15/14 2106 02/16/14 0030 02/16/14 0433 02/16/14 0824 02/16/14 1314  GLUCAP 170* 112* 161* 92 108*    Time coordinating discharge:  Greater than 30 minutes  Signed:  Tangala Wiegert, DO [facility] Pager: [phone] 02/17/2014, 10:51 AM

## 2014-02-16 NOTE — Progress Notes (Signed)
Clinical Social Work Department BRIEF PSYCHOSOCIAL ASSESSMENT 02/16/2014  Patient:  [name], [name]     Account Number:  [id number]     Admit date:  02/14/2014  Clinical Social Worker:  Lacie Scotts  Date/Time:  02/16/2014 10:54 AM  Referred by:  Physician  Date Referred:  02/15/2014 Referred for  SNF Placement   Other Referral:   Interview type:  Family Other interview type:    PSYCHOSOCIAL DATA Living Status:  FACILITY Admitted from facility:  [facility] Level of care:  [place] Primary support name:  Sherolyn Buba Primary support relationship to patient:  CHILD, ADULT Degree of support available:   supportive    CURRENT CONCERNS Current Concerns  Post-Acute Placement   Other Concerns:    SOCIAL WORK ASSESSMENT / PLAN Pt is a 80 yr old female admitted from [place] due to hip fx. Pt has required hip surgery. She plans to return to [facility] unit following hospital d/c. CSW has contacted SNF and d/c plans have been confirmed. CSW will continue to follow to assist with d/c planning to SNF.   Assessment/plan status:  Psychosocial Support/Ongoing Assessment of Needs Other assessment/ plan:   Information/referral to community resources:   None needed at this time.    PATIENT'S/FAMILY'S RESPONSE TO PLAN OF CARE: Pt / daughter appreciated CSW visit. Pt's mood is bright. Both pt / daughter are looking forward to pt's return to [facility].   Werner Lean LCSW [phone]

## 2014-02-16 NOTE — Evaluation (Signed)
Clinical/Bedside Swallow Evaluation Patient Details  Name: [name] MRN: [id number] Date of Birth: Aug 15, 1916  Today's Date: 02/16/2014 Time: 1240-1310 SLP Time Calculation (min): 30 min  Past Medical History:  Past Medical History  Diagnosis Date  . Allergic rhinitis 07/23/2012  . Unspecified malignant neoplasm of skin of other and unspecified parts of face 2012  . Malignant neoplasm of breast (female), unspecified site 70  . Neoplasm of unspecified nature of breast 2000  . Vitamin D deficiency 2009  . Other and unspecified hyperlipidemia 2005  . Anemia, unspecified 2005  . Anxiety state, unspecified 2005  . Depressive disorder, not elsewhere classified 2005  . Senile cataract, unspecified 2005  . Unspecified essential hypertension 2005  . Subdural hemorrhage 2007  . External hemorrhoids without mention of complication 0347  . Unspecified venous (peripheral) insufficiency 2001  . Disturbance of salivary secretion 2007  . GERD (gastroesophageal reflux disease) 2005  . Hypertonicity of bladder 2006  . Urinary tract infection, site not specified 2013  . Other seborrheic dermatitis 2007  . Osteoarthrosis, unspecified whether generalized or localized, unspecified site 2005  . Pain in joint, shoulder region 2008  . Senile osteoporosis 2000  . Insomnia, unspecified 2005  . Abnormality of gait 2007  . Unspecified urinary incontinence 2005  . Vertebral fracture, closed 2006  . Closed fracture of sternum 2006  . Closed fracture of two ribs 2013    left 5,6th  . Pain in joint, shoulder region 10/21/2012  . Basal cell carcinoma, face 03/20/2013   Past Surgical History:  Past Surgical History  Procedure Laterality Date  . Joint replacement Left     Knee  . Abdominal hysterectomy    . Breast surgery Right     Mastectomy  . Appendectomy    . Mohs surgery  2010    Nose  . Cyst excision Left 2011    Calcified cyst L shin  . Esophagogastroduodenoscopy endoscopy  07/11/2013    Dr. Watt Climes hiatus hernia   HPI:  80 year old female with a history of anxiety, hypertension, subdural hematoma, breast cancer presents after a mechanical fall at [facility] X-rays showed comminuted right intertrochanteric femur fracture per MD. The patient is normally wheelchair bound and is able to feed herself. She underwent ORIF with placement of an IM nail on 02/15/2014.  Family reports pt recently had "bilateral" pneumonia.  She has a history of chronic dysphagia s/p dilatation twice - most recent this year 06/2013.  Family reports pt with frequent expectoration of viscous mucus x one year.  BSE ordered due to pt demonstrating symptoms of dysphagia yesterday.     Assessment / Plan / Recommendation Clinical Impression  Pt presents with symptoms of mild oral (suspect cognitive based) dysphagia without s/s of aspiration/laryngeal penetration.  Mildly increased mastication noted with delays but pt able to orally transit and swallow independently.  Voice remained clear throughout evaluation.  Daughters x2 present and one reports pt's swallowing is improved today compared to yesterday.  Given pt underwent surgery yesterday- suspect transient oropharyngeal dysphagia that has largely resolved.   Pt did not complain of globus during testing but previously when this has occured, it was referrant from distal esophagus. Suspect her kyphosis contributes largely to her clearance of boluses as well.  Recommend to advance diet to soft/thin with hope to advance to regular as minimal oral deficits abate.    Educated pt and family to recommendations, compensation strategies including :  consuming liquids t/o meals- warm liquids, staying upright as much  as able and self feeding, etc.  As all education completed and pt with baseline known primary esophageal dysphagia - SLP to sign off.  Please reorder if indicated.      Aspiration Risk  Mild (ongoing due to pt's known esophageal  issues)    Diet Recommendation  Dysphagia 3 (Mechanical Soft);Thin liquid   Liquid Administration via: Cup;Straw Medication Administration: Whole meds with liquid Supervision: Patient able to self feed Compensations: Slow rate;Small sips/bites (consume liquids t/o meal- prefer warm) Postural Changes and/or Swallow Maneuvers: Seated upright 90 degrees;Upright 30-60 min after meal    Other  Recommendations Oral Care Recommendations: Oral care BID Other Recommendations: Clarify dietary restrictions   Follow Up Recommendations  None    Frequency and Duration   n/a     Pertinent Vitals/Pain Afebrile, decreased     Swallow Study Prior Functional Status   see Maish Vaya Date of Onset: 02/16/14 HPI: 80 year old female with a history of anxiety, hypertension, subdural hematoma, breast cancer presents after a mechanical fall at [facility]. X-rays showed comminuted right intertrochanteric femur fracture per MD. The patient is normally wheelchair bound and is able to feed herself. She underwent ORIF with placement of an IM nail on 02/15/2014.  Family reports pt recently had "bilateral" pneumonia.  She has a history of chronic dysphagia s/p dilatation twice - most recent this year 06/2013.  Family reports pt with frequent expectoration of viscous mucus x one year.  BSE ordered due to pt demonstrating symptoms of dysphagia yesterday.   Type of Study: Bedside swallow evaluation Previous Swallow Assessment: MBS 08/2011 - suspect primary esophageal dysphagia, 10/2011 EGD with dilatation, 06/2013 EGD with dilatation - hiatal hernia and mild resistance at 14 mm with dilatation Diet Prior to this Study: Dysphagia 3 (soft);Nectar-thick liquids Temperature Spikes Noted: No Respiratory Status: Room air History of Recent Intubation: No Behavior/Cognition: Alert;Cooperative;Pleasant mood (delayed responses noted) Oral Cavity - Dentition: Adequate natural dentition Self-Feeding Abilities: Needs set up;Able to feed self Patient  Positioning: Upright in bed Baseline Vocal Quality: Clear Volitional Cough: Weak    Oral/Motor/Sensory Function Overall Oral Motor/Sensory Function: Appears within functional limits for tasks assessed   Ice Chips Ice chips: Not tested   Thin Liquid Thin Liquid: Within functional limits Presentation: Self Fed;Straw    Nectar Thick Nectar Thick Liquid: Not tested   Honey Thick Honey Thick Liquid: Not tested   Puree Puree: Impaired Presentation: Self Fed Oral Phase Impairments: Reduced lingual movement/coordination;Impaired anterior to posterior transit Oral Phase Functional Implications: Prolonged oral transit Other Comments: slow oral transiting but functional with pt self feeding and likely beneficial given her age   Solid   GO    Solid: Impaired Presentation: Self Fed Oral Phase Impairments: Reduced lingual movement/coordination;Impaired anterior to posterior transit Oral Phase Functional Implications: Other (comment);Oral residue (slow oral transiting, pt aware of crumbs in oral cavity and attempting to clear)       Claudie Fisherman, [facility] SLP [phone]

## 2014-02-16 NOTE — Plan of Care (Signed)
Problem: Phase II Progression Outcomes Goal: CMS/Neurovascular status WDL Outcome: Progressing Goal: Tolerating diet Outcome: Progressing

## 2014-02-16 NOTE — Progress Notes (Signed)
Subjective: Pain controlled.  Daughters present.     Objective: Vital signs in last 24 hours: Temp:  [97.5 F (36.4 C)-98.2 F (36.8 C)] 97.7 F (36.5 C) (11/02 0956) Pulse Rate:  [72-104] 77 (11/02 0956) Resp:  [14-16] 16 (11/02 0956) BP: (111-176)/(37-84) 147/47 mmHg (11/02 0956) SpO2:  [91 %-100 %] 93 % (11/02 0956)  Intake/Output from previous day: 11/01 0701 - 11/02 0700 In: 2335 [P.O.:600; I.V.:1230; IV [id number]:086] Out: 2350 [Urine:2350] Intake/Output this shift: Total I/O In: -  Out: 20 [Urine:20]   Recent Labs  02/14/14 2159 02/15/14 0541 02/16/14 0511  HGB 13.4 11.3* 8.9*    Recent Labs  02/15/14 0541 02/16/14 0511  WBC 13.1* 7.3  RBC 3.82* 2.90*  HCT 35.8* 26.9*  PLT 195 194    Recent Labs  02/15/14 0541 02/16/14 0511  NA 132* 137  K 3.4* 3.1*  CL 95* 101  CO2 21 24  BUN 10 10  CREATININE 0.73 0.91  GLUCOSE 159* 148*  CALCIUM 8.8 8.0*   No results for input(s): LABPT, INR in the last 72 hours. Exam:  Right hip wound looks good.  bilat calves nontender.  bilat knee joint lines tender.  NVI.    Assessment/Plan: Continue present care.  Daughters states that patient had very poor mobility at SNF.  Mostly in bed, recliner or wheelchair.  Could to bed to chair transfers but would need someone in room with her.  Transfer back to snf when medicine feels that she is stable.     Dyann Goodspeed M 02/16/2014, 12:09 PM

## 2014-02-16 NOTE — Evaluation (Signed)
Physical Therapy Evaluation Patient Details Name: [name] MRN: [id number] DOB: [birth year] Today's Date: 02/16/2014   History of Present Illness  80 yo female sustained fall  and sustained intertrochanteric hip fracture. S/P IM nail 02/15/14. pt non ambulatory pta.  Clinical Impression  Patient tolerated sitting at the edge of the bed. Pt. Will benefit from PT  While in acute care.   Follow Up Recommendations SNF;Supervision/Assistance - 24 hour    Equipment Recommendations  None recommended by PT    Recommendations for Other Services       Precautions / Restrictions Precautions Precautions: Fall Restrictions Other Position/Activity Restrictions: not specified in chart but non ambulatory PTA, per daughter, 2 persons did pivot at times but used lift mostly      Mobility  Bed Mobility Overal bed mobility: +2 for physical assistance;+ 2 for safety/equipment;Needs Assistance Bed Mobility: Rolling Rolling: Total assist;+2 for physical assistance;+2 for safety/equipment   Supine to sit: Total assist;+2 for physical assistance Sit to supine: Total assist;+2 for physical assistance   General bed mobility comments: bed pad used and  slide sheet utilized for moving in/on bed., partial roll to each side.  Transfers                    Ambulation/Gait                Stairs            Wheelchair Mobility    Modified Rankin (Stroke Patients Only)       Balance Overall balance assessment: Needs assistance;History of Falls Sitting-balance support: Bilateral upper extremity supported;Feet supported Sitting balance-Leahy Scale: Fair Sitting balance - Comments: sat x 5 minutes                                     Pertinent Vitals/Pain Pain Assessment: Faces Faces Pain Scale: Hurts little more Pain Location: r hip Pain Descriptors / Indicators: Discomfort Pain Intervention(s): Premedicated before session;Repositioned    Home Living  Family/patient expects to be discharged to:: Skilled nursing facility                      Prior Function Level of Independence: Needs assistance   Gait / Transfers Assistance Needed: total           Hand Dominance        Extremity/Trunk Assessment   Upper Extremity Assessment: Generalized weakness           Lower Extremity Assessment: RLE deficits/detail;LLE deficits/detail RLE Deficits / Details: assist to move leg to edge,  LLE Deficits / Details: moves leg in bed minimally  Cervical / Trunk Assessment: Kyphotic  Communication      Cognition Arousal/Alertness: Awake/alert Behavior During Therapy: WFL for tasks assessed/performed Overall Cognitive Status: History of cognitive impairments - at baseline                      General Comments      Exercises        Assessment/Plan    PT Assessment Patient needs continued PT services  PT Diagnosis Generalized weakness;Acute pain;Altered mental status   PT Problem List Decreased strength;Decreased range of motion;Decreased activity tolerance;Decreased mobility;Pain  PT Treatment Interventions DME instruction;Functional mobility training;Therapeutic activities;Therapeutic exercise   PT Goals (Current goals can be found in the Care Plan section) Acute Rehab PT Goals Patient Stated Goal: agreed  to sitting up- "whatever you think is best" PT Goal Formulation: With family Time For Goal Achievement: 03/02/14 Potential to Achieve Goals: Good    Frequency Min 3X/week   Barriers to discharge        Co-evaluation               End of Session   Activity Tolerance: Patient tolerated treatment well Patient left: in bed;with call bell/phone within reach;with bed alarm set;with family/visitor present Nurse Communication: Mobility status;Need for lift equipment         Time: [phone] PT Time Calculation (min): 28 min   Charges:   PT Evaluation $Initial PT Evaluation Tier I: 1  Procedure PT Treatments $Therapeutic Activity: 23-37 mins   PT G Codes:          Claretha Cooper 02/16/2014, 6:08 PM

## 2014-02-16 NOTE — Progress Notes (Signed)
PROGRESS NOTE  [name] JOI:[id number] DOB: 1917-01-30 DOA: 02/14/2014 PCP: Estill Dooms, MD  Brief history 80 year old female with a history of anxiety, hypertension, subdural hematoma, breast cancer presents after a mechanical fall at [facility]. X-rays in the emergency department revealed a comminuted right intertrochanteric femur fracture. The patient is normally wheelchair bound. The patient was seen by orthopedics, Dr. Rolena Infante. She underwent ORIF with placement of an IM nail on 02/15/2014. The patient recently recovered from an episode of pneumonia after being treated with oral antibiotics. She has a history of chronic dysphagia.  Assessment/Plan: Comminuted right intertrochanteric femur fracture -Status post ORIF 02/15/14, Dr. Rolena Infante -PT/OT -anticipate return to SNF when cleared by ortho -pain controlled -DVT prophylaxis per ortho -normally WC bound at baseline Dysphagia with chronic aspiration -speech therapy evaluation--pending -empiric unasyn -pt initially placed on dys 3 diet on 02/15/14--then one daughter wanted changed to clears -02/16/14--different daughter now concerned about "choking" on clear liquids and wants diet changed--cough improved with chin tuck technique -place on dys 3 diet with nectar thickened liquids until speech comes to see  -encourage incentive spirometry HTN -elevated BP partly due to pain--BP has been labile -continue metoprolol succinate-did not receive on 02/14/14 Impaired glucose tolerance -Discontinue all CBG checks -Given current recommendations and the patient's age--allow for liberal glycemic control Anxiety/Depression -continue Cymbalta Hypokalemia -replete -check mag--1.7 Hyponatremia -appears volume depleted -IV NS--improved Family Communication: Daughter updated at beside Disposition Plan: SNF when cleared by ortho           Procedures/Studies: Dg Chest 1 View  02/14/2014   CLINICAL DATA:   Right hip fracture ; status post fall  EXAM: CHEST - 1 VIEW  COMPARISON:  October 31, 2005  FINDINGS: There is apparent scarring in the upper lobes bilaterally, a finding that has progressed compared to the prior study. Elsewhere lungs are clear. Heart size and pulmonary vascularity are normal. No adenopathy. There are surgical clips in the right axilla with evidence of previous mastectomy on the right. No bone lesions. No pneumothorax.  IMPRESSION: Apparent scarring in the upper lobes, progressed compared to prior study. This distribution raises concern for potential chronic aspiration in the upper lobes. There is no frank edema or consolidation. No pneumothorax. No acute fracture.   Electronically Signed   By: Lowella Grip M.D.   On: 02/14/2014 21:50   Dg Hip Complete Right  02/15/2014   CLINICAL DATA:  Right hip fracture.  Subsequent encounter.  EXAM: DG OPERATIVE right HIP 1-2 VIEWS  TECHNIQUE: Fluoroscopic spot image(s) were submitted for interpretation post-operatively.  COMPARISON:  None.  FINDINGS: Multiple spot films show placement of a screw and intramedullary rod transfixing the intertrochanteric right hip fracture in near anatomic alignment. No other fracture or dislocation identified.  IMPRESSION: Internal fixation of right hip fracture in near anatomic alignment.   Electronically Signed   By: Earle Gell M.D.   On: 02/15/2014 10:53   Dg Hip Complete Right  02/14/2014   CLINICAL DATA:  [place] out of chair  EXAM: RIGHT HIP - COMPLETE 2+ VIEW  COMPARISON:  None.  FINDINGS: There is a comminuted, intertrochanteric fracture involving the proximal right femur. No dislocation. The left hip and left proximal femur appear intact.  IMPRESSION: 1. Comminuted, intertrochanteric fracture involves the proximal right femur.   Electronically Signed   By: Kerby Moors M.D.   On: 02/14/2014 21:47   Ct Cervical Spine Wo Contrast  02/14/2014  CLINICAL DATA:  [place] from chair were without loss of  consciousness, neck pain  EXAM: CT CERVICAL SPINE WITHOUT CONTRAST  TECHNIQUE: Multidetector CT imaging of the cervical spine was performed without intravenous contrast. Multiplanar CT image reconstructions were also generated.  COMPARISON:  12/22/2005  FINDINGS: Seven cervical segments are well visualized. Apparent congenital fusion of C3 and C4 is noted. Multilevel facet hypertrophic changes are noted. No acute fracture or acute facet abnormality is seen. Surrounding soft tissues are stable from the prior exam. A hypodensity is again noted in the right lobe of the thyroid stable from the previous exam. Given its stability it is likely benign in etiology. The visualized lung apices are within normal limits with some scarring in the right apex.  IMPRESSION: Multilevel degenerative change.  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   Electronically Signed   By: Inez Catalina M.D.   On: 02/14/2014 21:52   Dg C-arm 1-60 Min-no Report  02/15/2014   CLINICAL DATA:  Right hip fracture.  Subsequent encounter.  EXAM: DG OPERATIVE right HIP 1-2 VIEWS  TECHNIQUE: Fluoroscopic spot image(s) were submitted for interpretation post-operatively.  COMPARISON:  None.  FINDINGS: Multiple spot films show placement of a screw and intramedullary rod transfixing the intertrochanteric right hip fracture in near anatomic alignment. No other fracture or dislocation identified.  IMPRESSION: Internal fixation of right hip fracture in near anatomic alignment.   Electronically Signed   By: Earle Gell M.D.   On: 02/15/2014 10:53         Subjective: The patient continues to cough with clear sputum production. Denies any fevers, chills, chest pain, shortness of breath, nausea, vomiting, diarrhea, abdominal pain. She is eating fairly well. No diarrhea.  Objective: Filed Vitals:   02/15/14 2008 02/16/14 0105 02/16/14 0547 02/16/14 0956  BP: 135/56 122/76 124/37 147/47  Pulse: 102 72 84  77  Temp: 97.8 F (36.6 C) 98.1 F (36.7 C) 98.2 F (36.8 C) 97.7 F (36.5 C)  TempSrc: Oral Oral  Oral  Resp: 16 14 16 16   Weight:      SpO2: 94% 96% 91% 93%    Intake/Output Summary (Last 24 hours) at 02/16/14 1051 Last data filed at 02/16/14 1000  Gross per 24 hour  Intake   2335 ml  Output    970 ml  Net   1365 ml   Weight change:  Exam:   General:  Pt is alert, follows commands appropriately, not in acute distress  HEENT: No icterus, No thrush, [place]/AT  Cardiovascular: RRR, S1/S2, no rubs, no gallops  Respiratory: bibasilar crackles, left greater than right. No wheezing. Good air movement.  Abdomen: Soft/+BS, non tender, non distended, no guarding  Extremities: No edema, No lymphangitis, No petechiae, No rashes, no synovitis; right hip incision well approximated without erythema or drainage  Data Reviewed: Basic Metabolic Panel:  Recent Labs Lab 02/14/14 2159 02/15/14 0541 02/16/14 0511  NA 136* 132* 137  K 3.8 3.4* 3.1*  CL 96 95* 101  CO2 28 21 24   GLUCOSE 132* 159* 148*  BUN 13 10 10   CREATININE 0.92 0.73 0.91  CALCIUM 9.1 8.8 8.0*  MG  --  1.8 1.7   Liver Function Tests:  Recent Labs Lab 02/15/14 0541  AST 19  ALT 12  ALKPHOS 57  BILITOT 0.3  PROT 7.0  ALBUMIN 3.1*   No results for input(s): LIPASE, AMYLASE in the last 168 hours. No results for  input(s): AMMONIA in the last 168 hours. CBC:  Recent Labs Lab 02/14/14 2159 02/15/14 0541 02/16/14 0511  WBC 10.8* 13.1* 7.3  NEUTROABS 9.0* 12.4*  --   HGB 13.4 11.3* 8.9*  HCT 40.0 35.8* 26.9*  MCV 91.7 93.7 92.8  PLT 245 195 194   Cardiac Enzymes: No results for input(s): CKTOTAL, CKMB, CKMBINDEX, TROPONINI in the last 168 hours. BNP: Invalid input(s): POCBNP CBG:  Recent Labs Lab 02/15/14 1620 02/15/14 2106 02/16/14 0030 02/16/14 0433 02/16/14 0824  GLUCAP 201* 170* 112* 161* 92    Recent Results (from the past 240 hour(s))  Surgical PCR screen     Status: Abnormal    Collection Time: 02/15/14  4:54 AM  Result Value Ref Range Status   MRSA, PCR NEGATIVE NEGATIVE Final   Staphylococcus aureus POSITIVE (A) NEGATIVE Final    Comment:        The Xpert SA Assay (FDA approved for NASAL specimens in patients over 63 years of age), is one component of a comprehensive surveillance program.  Test performance has been validated by [employer] for patients greater than or equal to 61 year old. It is not intended to diagnose infection nor to guide or monitor treatment.      Scheduled Meds: . acetaminophen  1,000 mg Oral 4 times per day  . ampicillin-sulbactam (UNASYN) IV  3 g Intravenous Q6H  . Chlorhexidine Gluconate Cloth  6 each Topical Daily  . docusate sodium  100 mg Oral BID  . DULoxetine  30 mg Oral QHS  . enoxaparin (LOVENOX) injection  40 mg Subcutaneous Q24H  . feeding supplement (RESOURCE BREEZE)  1 Container Oral TID BM  . ferrous sulfate  325 mg Oral TID WC  . fluticasone  1 spray Each Nare Daily  . metoprolol succinate  12.5 mg Oral QHS  . mupirocin ointment  1 application Nasal BID  . OxyCODONE  10 mg Oral Q12H  . senna-docusate  1 tablet Oral QHS   Continuous Infusions:    Chia Rock, DO  [facility] Pager [phone]  If 7PM-7AM, please contact night-coverage [email or web address] Password [email or web address] 02/16/2014, 10:51 AM   LOS: 2 days

## 2014-02-17 DIAGNOSIS — R1314 Dysphagia, pharyngoesophageal phase: Secondary | ICD-10-CM

## 2014-02-17 LAB — BASIC METABOLIC PANEL
Anion gap: 11 (ref 5–15)
BUN: 12 mg/dL (ref 6–23)
CO2: 25 mEq/L (ref 19–32)
Calcium: 8.6 mg/dL (ref 8.4–10.5)
Chloride: 103 mEq/L (ref 96–112)
Creatinine, Ser: 0.88 mg/dL (ref 0.50–1.10)
GFR calc non Af Amer: 54 mL/min — ABNORMAL LOW (ref >90)
GFR, EST AFRICAN AMERICAN: 63 mL/min — AB (ref >90)
Glucose, Bld: 100 mg/dL — ABNORMAL HIGH (ref 70–99)
POTASSIUM: 3.8 meq/L (ref 3.7–5.3)
Sodium: 139 mEq/L (ref 137–147)

## 2014-02-17 LAB — URINE CULTURE
CULTURE: NO GROWTH
Colony Count: NO GROWTH

## 2014-02-17 MED ORDER — AMOXICILLIN-POT CLAVULANATE 875-125 MG PO TABS: 1.0000 | ORAL_TABLET | Freq: Two times a day (BID) | ORAL | Status: DC

## 2014-02-17 MED ORDER — OXYCODONE HCL 5 MG PO TABS: 5.0000 mg | ORAL_TABLET | ORAL | Status: DC | PRN

## 2014-02-17 MED ORDER — SODIUM CHLORIDE 0.9 % IV SOLN
3.0000 g | Freq: Three times a day (TID) | INTRAVENOUS | Status: DC
  Filled 2014-02-17 (×2): qty 3

## 2014-02-17 MED ORDER — BISACODYL 10 MG RE SUPP
10.0000 mg | Freq: Once | RECTAL | Status: AC
  Administered 2014-02-17: 10 mg via RECTAL
  Filled 2014-02-17: qty 1

## 2014-02-17 MED ORDER — AMOXICILLIN-POT CLAVULANATE 875-125 MG PO TABS
1.0000 | ORAL_TABLET | Freq: Two times a day (BID) | ORAL | Status: AC
  Administered 2014-02-17: 1 via ORAL
  Filled 2014-02-17: qty 1

## 2014-02-17 MED ORDER — BOOST / RESOURCE BREEZE PO LIQD: 1.0000 | Freq: Three times a day (TID) | ORAL | Status: DC

## 2014-02-17 MED ORDER — METHOCARBAMOL 500 MG PO TABS: 500.0000 mg | ORAL_TABLET | Freq: Four times a day (QID) | ORAL | Status: DC | PRN

## 2014-02-17 NOTE — Progress Notes (Signed)
Subjective: Doing ok.  Pain controlled.  Daughter present.     Objective: Vital signs in last 24 hours: Temp:  [97.5 F (36.4 C)-98.7 F (37.1 C)] 98 F (36.7 C) (11/03 0532) Pulse Rate:  [83-87] 86 (11/03 0532) Resp:  [16-18] 16 (11/03 0800) BP: (120-155)/(40-57) 155/57 mmHg (11/03 0532) SpO2:  [90 %-96 %] 96 % (11/03 0800) Weight:  [63.504 kg (140 lb)] 63.504 kg (140 lb) (11/02 1300)  Intake/Output from previous day: 11/02 0701 - 11/03 0700 In: 720 [P.O.:720] Out: 170 [Urine:170] Intake/Output this shift:     Recent Labs  02/14/14 2159 02/15/14 0541 02/16/14 0511  HGB 13.4 11.3* 8.9*    Recent Labs  02/15/14 0541 02/16/14 0511  WBC 13.1* 7.3  RBC 3.82* 2.90*  HCT 35.8* 26.9*  PLT 195 194    Recent Labs  02/16/14 0511 02/17/14 0444  NA 137 139  K 3.1* 3.8  CL 101 103  CO2 24 25  BUN 10 12  CREATININE 0.91 0.88  GLUCOSE 148* 100*  CALCIUM 8.0* 8.6   No results for input(s): LABPT, INR in the last 72 hours.  Exam:  Hip wounds look good.  No signs of infection.    Assessment/Plan: Medicine has cleared patient for transfer back to SNF.  Will f/u with dr Rolena Infante 2-3 weeks postop.     Dhruv Christina M 02/17/2014, 11:57 AM

## 2014-02-17 NOTE — Plan of Care (Signed)
Problem: Phase I Progression Outcomes Goal: Post op hemodynamically stable Outcome: Completed/Met Date Met:  02/17/14

## 2014-02-17 NOTE — Care Management Note (Signed)
    Page 1 of 1   02/17/2014     3:16:03 PM CARE MANAGEMENT NOTE 02/17/2014  Patient:  [name], [name]   Account Number:  [id number]  Date Initiated:  02/17/2014  Documentation initiated by:  [facility]  Subjective/Objective Assessment:   adm: fall  and sustained intertrochanteric hip fracture     Action/Plan:   return to SNF   Anticipated DC Date:  02/17/2014   Anticipated DC Plan:        [place]  CM consult      Choice offered to / List presented to:             Status of service:  Completed, signed off Medicare Important Message given?   (If response is "NO", the following Medicare IM given date fields will be blank) Date Medicare IM given:   Medicare IM given by:   Date Additional Medicare IM given:   Additional Medicare IM given by:    Discharge Disposition:  [place]  Per UR Regulation:    If discussed at Long Length of Stay Meetings, dates discussed:    Comments:  02/17/14 15:14 Cm notes pt to go back to SNF;SCW arranging. No other CM needs were communicated.  Mariane Masters, BSN, CM [phone].

## 2014-02-17 NOTE — Progress Notes (Signed)
Pt ready to return to [facility] ( SNF unit ). Pt / daughter are in agreement with d/c plan . P-TAR transport required. NSG reviewed d/c summary, scripts, avs. Scripts included in d/c packet.  Werner Lean LCSW [phone]

## 2014-02-17 NOTE — Progress Notes (Signed)
ANTIBIOTIC CONSULT NOTE - FOLLOW UP  Pharmacy Consult for Unasyn Indication: Aspiration PNA  Allergies  Allergen Reactions  . Other     "Epidural medication" per [facility] record  . Sulfa Antibiotics     Patient Measurements: Height: 5' (152.4 cm) Weight: 140 lb (63.504 kg) IBW/kg (Calculated) : 45.5   Vital Signs: Temp: 98 F (36.7 C) (11/03 0532) Temp Source: Oral (11/03 0532) BP: 155/57 mmHg (11/03 0532) Pulse Rate: 86 (11/03 0532) Intake/Output from previous day: 11/02 0701 - 11/03 0700 In: 720 [P.O.:720] Out: 170 [Urine:170] Intake/Output from this shift:    Labs:  Recent Labs  02/14/14 2159 02/15/14 0541 02/16/14 0511 02/17/14 0444  WBC 10.8* 13.1* 7.3  --   HGB 13.4 11.3* 8.9*  --   PLT 245 195 194  --   CREATININE 0.92 0.73 0.91 0.88   Estimated Creatinine Clearance: 31.8 mL/min (by C-G formula based on Cr of 0.88). No results for input(s): VANCOTROUGH, VANCOPEAK, VANCORANDOM, GENTTROUGH, GENTPEAK, GENTRANDOM, TOBRATROUGH, TOBRAPEAK, TOBRARND, AMIKACINPEAK, AMIKACINTROU, AMIKACIN in the last 72 hours.   Microbiology: Recent Results (from the past 720 hour(s))  Surgical PCR screen     Status: Abnormal   Collection Time: 02/15/14  4:54 AM  Result Value Ref Range Status   MRSA, PCR NEGATIVE NEGATIVE Final   Staphylococcus aureus POSITIVE (A) NEGATIVE Final    Comment:        The Xpert SA Assay (FDA approved for NASAL specimens in patients over 48 years of age), is one component of a comprehensive surveillance program.  Test performance has been validated by [employer] for patients greater than or equal to 57 year old. It is not intended to diagnose infection nor to guide or monitor treatment.   Urine culture     Status: None   Collection Time: 02/15/14 10:30 AM  Result Value Ref Range Status   Specimen Description URINE, CLEAN CATCH  Final   Special Requests NONE  Final   Culture  Setup Time   Final    02/15/2014 23:29 Performed at [place] Performed at [facility]   Final   Culture NO GROWTH Performed at [facility]   Final   Report Status 02/17/2014 FINAL  Final   Assessment: 80 y/o F with PMH of HTN, breast cancer s/p mastectomy, chronic dysphagia who was brought to the ER after patient had a fall at her nursing home. Pt was found to have hip fracture and underwent ORIF with placement of an IM nail on 11/1. Pharmacy consulted to assist with empiric Unasyn dosing for aspiration.  11/1 >> Unasyn >>  Tmax: AF WBCs: WNL, trending down Renal: SCr 0.88, Cl ~32 mL/min CG  11/1 urine: no growth PCR screen: pos Staph, neg MRSA  Goal of Therapy:  Appropriate antibiotic dosing for renal function; eradication of infection  Plan:  Change Unasyn to 3 grams IV q8h per renal function/indication. Follow renal function, culture results, clinical course.   Lindell Spar, PharmD, BCPS Pager: [phone] 02/17/2014 10:41 AM

## 2014-02-19 ENCOUNTER — Encounter: Payer: Self-pay | Admitting: Internal Medicine

## 2014-02-19 ENCOUNTER — Encounter (HOSPITAL_COMMUNITY): Payer: Self-pay | Admitting: Orthopedic Surgery

## 2014-02-19 MED ORDER — BUPIVACAINE HCL (PF) 0.25 % IJ SOLN
INTRAMUSCULAR | Status: DC | PRN
  Administered 2014-02-19: 10 mL

## 2014-03-02 ENCOUNTER — Non-Acute Institutional Stay (SKILLED_NURSING_FACILITY): Payer: Medicare Other | Admitting: Adult Health

## 2014-03-02 DIAGNOSIS — K5901 Slow transit constipation: Secondary | ICD-10-CM

## 2014-03-03 ENCOUNTER — Encounter: Payer: Self-pay | Admitting: Adult Health

## 2014-03-03 DIAGNOSIS — K5900 Constipation, unspecified: Secondary | ICD-10-CM | POA: Insufficient documentation

## 2014-03-03 NOTE — Progress Notes (Signed)
Patient ID: [name], female   DOB: 1916-10-31, 80 y.o.   MRN: [id number]  Nursing Home Location:  [place]: SNF (31)  Chief Complaint  Patient presents with  . Acute Visit    constipation    HPI:  This is a 80 y.o. femlae residing at [facility]. I was asked to see her due to complaints of constipation. She apparently has not had a BM in a few days.  She has not had a any abd pain, nausea, or vomiting. She is afebrile and pleasant for the visit.   Review of Systems:  Review of Systems  Constitutional: Negative for chills, diaphoresis, activity change and appetite change.  Gastrointestinal: Positive for constipation. Negative for nausea, vomiting, abdominal pain, diarrhea and abdominal distention.    Medications: Patient's Medications  New Prescriptions   No medications on file  Previous Medications   ACETAMINOPHEN (TYLENOL) 325 MG TABLET    Take 650 mg by mouth. May have additional 650mg  daily as needed for pain relief   AMOXICILLIN-CLAVULANATE (AUGMENTIN) 875-125 MG PER TABLET    Take 1 tablet by mouth 2 (two) times daily.   DULOXETINE (CYMBALTA) 30 MG CAPSULE    Take 30 mg by mouth at bedtime.   FEEDING SUPPLEMENT, RESOURCE BREEZE, (RESOURCE BREEZE) LIQD    Take 1 Container by mouth 3 (three) times daily between meals.   FLUTICASONE (FLONASE) 50 MCG/ACT NASAL SPRAY    Place 1 spray into both nostrils daily.    FUROSEMIDE (LASIX) 40 MG TABLET    Take 40 mg by mouth daily.   IPRATROPIUM-ALBUTEROL (DUONEB) 0.5-2.5 (3) MG/3ML SOLN    Take 3 mLs by nebulization every 6 (six) hours as needed (wheezing).   LORAZEPAM (ATIVAN) 0.5 MG TABLET    Take 0.5 mg by mouth 2 (two) times daily.    LORAZEPAM (ATIVAN) 0.5 MG TABLET    Take 0.5 mg by mouth 2 (two) times daily as needed (restlessness).   MELATONIN 5 MG TABS    Take 1 tablet by mouth at bedtime. For insomnia   METHOCARBAMOL (ROBAXIN) 500 MG TABLET    Take 1  tablet (500 mg total) by mouth every 6 (six) hours as needed for muscle spasms.   METOPROLOL SUCCINATE (TOPROL-XL) 25 MG 24 HR TABLET    Take 12.5 mg by mouth at bedtime. For HTN   NYSTATIN (MYCOSTATIN/NYSTOP) 100000 UNIT/GM POWD    Apply 1 Bottle topically 2 (two) times daily. To reddened area, bilateral groin unitl 11/5   OVER THE COUNTER MEDICATION    Apply 1 application topically 2 (two) times daily. Aveeno moisturizing - To lower legs/feet   OXYCODONE (OXY IR/ROXICODONE) 5 MG IMMEDIATE RELEASE TABLET    Take 1-2 tablets (5-10 mg total) by mouth every 4 (four) hours as needed for moderate pain, severe pain or breakthrough pain.   SENNOSIDES-DOCUSATE SODIUM (SENOKOT-S) 8.6-50 MG TABLET    Take 1 tablet by mouth at bedtime. For constipation   TROLAMINE SALICYLATE (ASPERCREME) 10 % CREAM    Apply 1 application topically 3 (three) times daily. Apply to both knees  Modified Medications   No medications on file  Discontinued Medications   No medications on file     Physical Exam:  Filed Vitals:   03/02/14 1236  BP: 153/71  Pulse: 80  Temp: 98.3 F (36.8 C)  Resp: 18    Physical Exam  Constitutional: No distress.  Frail body habitus  Pulmonary/Chest: Effort  normal and breath sounds normal.  Abdominal: Soft. Bowel sounds are normal. She exhibits distension. There is no tenderness.  Skin: She is not diaphoretic.      Labs reviewed/Significant Diagnostic Results:  Basic Metabolic Panel:  Recent Labs  02/15/14 0541 02/16/14 0511 02/17/14 0444  NA 132* 137 139  K 3.4* 3.1* 3.8  CL 95* 101 103  CO2 21 24 25   GLUCOSE 159* 148* 100*  BUN 10 10 12   CREATININE 0.73 0.91 0.88  CALCIUM 8.8 8.0* 8.6  MG 1.8 1.7  --    Liver Function Tests:  Recent Labs  02/15/14 0541  AST 19  ALT 12  ALKPHOS 57  BILITOT 0.3  PROT 7.0  ALBUMIN 3.1*   No results for input(s): LIPASE, AMYLASE in the last 8760 hours. No results for input(s): AMMONIA in the last 8760  hours. CBC:  Recent Labs  02/14/14 2159 02/15/14 0541 02/16/14 0511  WBC 10.8* 13.1* 7.3  NEUTROABS 9.0* 12.4*  --   HGB 13.4 11.3* 8.9*  HCT 40.0 35.8* 26.9*  MCV 91.7 93.7 92.8  PLT 245 195 194   CBG:  Recent Labs  02/16/14 0433 02/16/14 0824 02/16/14 1314  GLUCAP 161* 92 108*   TSH: No results for input(s): TSH in the last 8760 hours. A1C: No results found for: HGBA1C Lipid Panel:  Recent Labs  08/07/13  CHOL 171  HDL 42  LDLCALC 109  TRIG 102       Assessment/Plan Constipation Senna s 2 tabs qhs   Cindi Carbon, ANP [facility] [phone]

## 2014-03-03 NOTE — Assessment & Plan Note (Signed)
Senna s 2 tabs qhs

## 2014-03-05 ENCOUNTER — Non-Acute Institutional Stay (SKILLED_NURSING_FACILITY): Payer: Medicare Other | Admitting: Adult Health

## 2014-03-05 DIAGNOSIS — F411 Generalized anxiety disorder: Secondary | ICD-10-CM

## 2014-03-05 LAB — CBC AND DIFFERENTIAL
HCT: 40 % (ref 36–46)
HEMATOCRIT: 29 % — AB (ref 36–46)
HEMOGLOBIN: 9.5 g/dL — AB (ref 12.0–16.0)
Hemoglobin: 13.4 g/dL (ref 12.0–16.0)
Platelets: 279 10*3/uL (ref 150–399)
Platelets: 400 10*3/uL — AB (ref 150–399)
WBC: 5.4 10^3/mL
WBC: 5.6 10*3/mL

## 2014-03-05 LAB — BASIC METABOLIC PANEL
BUN: 22 mg/dL — AB (ref 4–21)
CREATININE: 0.9 mg/dL (ref 0.5–1.1)
GLUCOSE: 91 mg/dL
POTASSIUM: 3.6 mmol/L (ref 3.4–5.3)
SODIUM: 136 mmol/L — AB (ref 137–147)

## 2014-03-06 ENCOUNTER — Encounter: Payer: Self-pay | Admitting: Internal Medicine

## 2014-03-06 ENCOUNTER — Encounter: Payer: Self-pay | Admitting: Adult Health

## 2014-03-06 NOTE — Assessment & Plan Note (Signed)
Increase Cymbalta to 60mg  p.o. QD. Continue scheduled and prn ativan. Provide supportive care to the resident and activities with other residents.

## 2014-03-06 NOTE — Progress Notes (Signed)
Patient ID: [name], female   DOB: 1916-08-02, 80 y.o.   MRN: [id number]  Nursing Home Location:  [place]: SNF (31)  Chief Complaint  Patient presents with  . Acute Visit    anxiety    HPI: This is a 80 y.o. female residing at [facility], skilled section. I was asked to see her today due to unrelieved feelings of anxiety about being left alone. The resident has memory loss and requires assistance for ADL's.  She is currently on scheduled Ativan and Cymbalta. She recently fell and had fx hip, further complicating her feelings of helpless. Her forgetfulness also complicates the scenario. She was placed on Cymbalta but this does not seem to have treated her anxiety as she still needs Ativan.   Review of Systems:  Review of Systems  Constitutional: Negative for fever, chills, diaphoresis, activity change, appetite change and fatigue.  Psychiatric/Behavioral: Positive for behavioral problems, confusion, sleep disturbance and agitation. Negative for suicidal ideas, hallucinations and decreased concentration. The patient is nervous/anxious.     Medications: Patient's Medications  New Prescriptions   No medications on file  Previous Medications   ACETAMINOPHEN (TYLENOL) 325 MG TABLET    Take 650 mg by mouth. May have additional 650mg  daily as needed for pain relief   AMOXICILLIN-CLAVULANATE (AUGMENTIN) 875-125 MG PER TABLET    Take 1 tablet by mouth 2 (two) times daily.   DULOXETINE (CYMBALTA) 60 MG CAPSULE    Take 60 mg by mouth daily.   FEEDING SUPPLEMENT, RESOURCE BREEZE, (RESOURCE BREEZE) LIQD    Take 1 Container by mouth 3 (three) times daily between meals.   FLUTICASONE (FLONASE) 50 MCG/ACT NASAL SPRAY    Place 1 spray into both nostrils daily.    FUROSEMIDE (LASIX) 40 MG TABLET    Take 40 mg by mouth daily.   IPRATROPIUM-ALBUTEROL (DUONEB) 0.5-2.5 (3) MG/3ML SOLN    Take 3 mLs by nebulization every 6 (six) hours  as needed (wheezing).   LORAZEPAM (ATIVAN) 0.5 MG TABLET    Take 0.5 mg by mouth 2 (two) times daily.    LORAZEPAM (ATIVAN) 0.5 MG TABLET    Take 0.5 mg by mouth 2 (two) times daily as needed (restlessness).   MELATONIN 5 MG TABS    Take 1 tablet by mouth at bedtime. For insomnia   METHOCARBAMOL (ROBAXIN) 500 MG TABLET    Take 1 tablet (500 mg total) by mouth every 6 (six) hours as needed for muscle spasms.   METOPROLOL SUCCINATE (TOPROL-XL) 25 MG 24 HR TABLET    Take 12.5 mg by mouth at bedtime. For HTN   NYSTATIN (MYCOSTATIN/NYSTOP) 100000 UNIT/GM POWD    Apply 1 Bottle topically 2 (two) times daily. To reddened area, bilateral groin unitl 11/5   OVER THE COUNTER MEDICATION    Apply 1 application topically 2 (two) times daily. Aveeno moisturizing - To lower legs/feet   OXYCODONE (OXY IR/ROXICODONE) 5 MG IMMEDIATE RELEASE TABLET    Take 1-2 tablets (5-10 mg total) by mouth every 4 (four) hours as needed for moderate pain, severe pain or breakthrough pain.   SENNOSIDES-DOCUSATE SODIUM (SENOKOT-S) 8.6-50 MG TABLET    Take 1 tablet by mouth at bedtime. For constipation   TROLAMINE SALICYLATE (ASPERCREME) 10 % CREAM    Apply 1 application topically 3 (three) times daily. Apply to both knees  Modified Medications   No medications on file  Discontinued Medications   DULOXETINE (CYMBALTA) 30 MG CAPSULE  Take 30 mg by mouth at bedtime.     Physical Exam:  There were no vitals filed for this visit.  Physical Exam  Constitutional:  Frail, thin, elderly  Neurological: She is alert. No cranial nerve deficit.  Oriented to self and situation, not date or place  Psychiatric:  Anxious, repeats herself.     Labs reviewed/Significant Diagnostic Results:  Basic Metabolic Panel:  Recent Labs  02/15/14 0541 02/16/14 0511 02/17/14 0444  NA 132* 137 139  K 3.4* 3.1* 3.8  CL 95* 101 103  CO2 21 24 25   GLUCOSE 159* 148* 100*  BUN 10 10 12   CREATININE 0.73 0.91 0.88  CALCIUM 8.8 8.0* 8.6  MG  1.8 1.7  --    Liver Function Tests:  Recent Labs  02/15/14 0541  AST 19  ALT 12  ALKPHOS 57  BILITOT 0.3  PROT 7.0  ALBUMIN 3.1*   No results for input(s): LIPASE, AMYLASE in the last 8760 hours. No results for input(s): AMMONIA in the last 8760 hours. CBC:  Recent Labs  02/14/14 2159 02/15/14 0541 02/16/14 0511  WBC 10.8* 13.1* 7.3  NEUTROABS 9.0* 12.4*  --   HGB 13.4 11.3* 8.9*  HCT 40.0 35.8* 26.9*  MCV 91.7 93.7 92.8  PLT 245 195 194   CBG:  Recent Labs  02/16/14 0433 02/16/14 0824 02/16/14 1314  GLUCAP 161* 92 108*   TSH: No results for input(s): TSH in the last 8760 hours. A1C: No results found for: HGBA1C Lipid Panel:  Recent Labs  08/07/13  CHOL 171  HDL 42  LDLCALC 109  TRIG 102       Assessment/Plan Anxiety state Increase Cymbalta to 60mg  p.o. QD. Continue scheduled and prn ativan. Provide supportive care to the resident and activities with other residents.   Cindi Carbon, ANP [facility] [phone]

## 2014-03-30 ENCOUNTER — Encounter: Payer: Self-pay | Admitting: Adult Health

## 2014-03-30 ENCOUNTER — Non-Acute Institutional Stay (SKILLED_NURSING_FACILITY): Payer: Medicare Other | Admitting: Adult Health

## 2014-03-30 DIAGNOSIS — F411 Generalized anxiety disorder: Secondary | ICD-10-CM

## 2014-03-30 DIAGNOSIS — R21 Rash and other nonspecific skin eruption: Secondary | ICD-10-CM

## 2014-03-30 DIAGNOSIS — R1314 Dysphagia, pharyngoesophageal phase: Secondary | ICD-10-CM

## 2014-03-30 DIAGNOSIS — M170 Bilateral primary osteoarthritis of knee: Secondary | ICD-10-CM

## 2014-03-30 DIAGNOSIS — R413 Other amnesia: Secondary | ICD-10-CM

## 2014-03-30 DIAGNOSIS — D638 Anemia in other chronic diseases classified elsewhere: Secondary | ICD-10-CM

## 2014-03-30 DIAGNOSIS — I10 Essential (primary) hypertension: Secondary | ICD-10-CM

## 2014-03-30 DIAGNOSIS — K5901 Slow transit constipation: Secondary | ICD-10-CM

## 2014-03-30 DIAGNOSIS — D649 Anemia, unspecified: Secondary | ICD-10-CM | POA: Insufficient documentation

## 2014-03-30 NOTE — Assessment & Plan Note (Signed)
Stable. Continue Miralax and Senna-s

## 2014-03-30 NOTE — Progress Notes (Signed)
Patient ID: AHILYN NELL, female   DOB: 1917-01-04, 80 y.o.   MRN: [id number]   Patient ID: ELLIANA BAL, female   DOB: [birth year], 80 y.o.   MRN: [id number]  Nursing Home Location:  [place]: SNF (31)  Chief Complaint  Patient presents with  . Acute Visit    rash  . Medical Management of Chronic Issues    HPI: This is a 80 y.o. female residing at [facility], skilled section. I was asked to see her today due to a rash on her back and inguinal folds that is itchy. She has tried aveeno cream but this has not helped.  I am also here to review her chronic medical issues. She has a hx of anxiety and this past month the ativan was increased to TID due to increased anxiety in the evening and not wanting to be alone. This has helped. The Cymbalta was increased in November. She was in the hospital 02/14/14-02/17/14 due to a right intertrochanteric hip fx s/p ORIF. She spends most of her time in the [facility].  She has no complaints of pain today. Her weight has remained stable over the past month. Her BP has been elevated at times ranging 119-158/64-93. Her last BIMS score was 6/15 on 02/24/14.  There is no MMSE for review.  Review of Systems:  Review of Systems  Constitutional: Negative for fever, chills, diaphoresis, activity change, appetite change and fatigue.  HENT: Positive for congestion. Negative for postnasal drip, rhinorrhea and trouble swallowing.   Respiratory: Negative for cough, shortness of breath and wheezing.   Cardiovascular: Negative for chest pain, palpitations and leg swelling.  Gastrointestinal: Negative for nausea, abdominal pain, diarrhea, constipation, blood in stool and abdominal distention.  Genitourinary: Negative for dysuria and difficulty urinating.  Musculoskeletal: Positive for arthralgias and gait problem. Negative for back pain and joint swelling.  Skin: Positive for rash. Negative for color change, pallor  and wound.  Neurological: Negative for dizziness, facial asymmetry and speech difficulty.  Psychiatric/Behavioral: Positive for behavioral problems, confusion and sleep disturbance. Negative for suicidal ideas, hallucinations, decreased concentration and agitation. The patient is nervous/anxious.   BIMS 6/15 (02/24/14)  Medications: Patient's Medications  New Prescriptions   No medications on file  Previous Medications   ACETAMINOPHEN (TYLENOL) 325 MG TABLET    Take 650 mg by mouth 2 (two) times daily. May have additional 650mg  daily as needed for pain relief   AMOXICILLIN-CLAVULANATE (AUGMENTIN) 875-125 MG PER TABLET    Take 1 tablet by mouth 2 (two) times daily.   DULOXETINE (CYMBALTA) 60 MG CAPSULE    Take 60 mg by mouth daily.   FEEDING SUPPLEMENT, RESOURCE BREEZE, (RESOURCE BREEZE) LIQD    Take 1 Container by mouth 3 (three) times daily between meals.   FERROUS SULFATE 325 (65 FE) MG TABLET    Take 325 mg by mouth 2 (two) times daily with a meal.   FLUTICASONE (FLONASE) 50 MCG/ACT NASAL SPRAY    Place 1 spray into both nostrils daily.    FUROSEMIDE (LASIX) 40 MG TABLET    Take 40 mg by mouth daily.   IPRATROPIUM-ALBUTEROL (DUONEB) 0.5-2.5 (3) MG/3ML SOLN    Take 3 mLs by nebulization every 6 (six) hours as needed (wheezing).   LORAZEPAM (ATIVAN) 0.5 MG TABLET    Take 0.5 mg by mouth 3 (three) times daily.    LORAZEPAM (ATIVAN) 0.5 MG TABLET    Take 0.5 mg by mouth 2 (two)  times daily as needed (restlessness).   MELATONIN 5 MG TABS    Take 1 tablet by mouth at bedtime. For insomnia   METHOCARBAMOL (ROBAXIN) 500 MG TABLET    Take 1 tablet (500 mg total) by mouth every 6 (six) hours as needed for muscle spasms.   METOPROLOL SUCCINATE (TOPROL-XL) 25 MG 24 HR TABLET    Take 12.5 mg by mouth at bedtime. For HTN   NYSTATIN (MYCOSTATIN/NYSTOP) 100000 UNIT/GM POWD    Apply 1 Bottle topically 2 (two) times daily. To reddened area, bilateral groin unitl 11/5   OVER THE COUNTER MEDICATION    Apply 1  application topically 2 (two) times daily. Aveeno moisturizing - To lower legs/feet   OXYCODONE (OXY IR/ROXICODONE) 5 MG IMMEDIATE RELEASE TABLET    Take 1-2 tablets (5-10 mg total) by mouth every 4 (four) hours as needed for moderate pain, severe pain or breakthrough pain.   POLYETHYLENE GLYCOL (MIRALAX / GLYCOLAX) PACKET    Take 17 g by mouth daily.   POTASSIUM CHLORIDE SA (K-DUR,KLOR-CON) 20 MEQ TABLET    Take 20 mEq by mouth once.   SENNOSIDES-DOCUSATE SODIUM (SENOKOT-S) 8.6-50 MG TABLET    Take 2 tablets by mouth at bedtime. For constipation   TROLAMINE SALICYLATE (ASPERCREME) 10 % CREAM    Apply 1 application topically 3 (three) times daily. Apply to both knees  Modified Medications   No medications on file  Discontinued Medications   No medications on file     Physical Exam:  Filed Vitals:   03/30/14 1417  BP: 153/71  Pulse: 73  Temp: 97.9 F (36.6 C)  Resp: 18  Weight: 155 lb 9.6 oz (70.58 kg)  SpO2: 95%    Physical Exam  Constitutional: No distress.  Frail, thin, elderly  Neck: No JVD present. No tracheal deviation present.  Cardiovascular: Normal rate, regular rhythm, normal heart sounds and intact distal pulses.   No murmur heard. Pulmonary/Chest: Effort normal and breath sounds normal. No respiratory distress. She has no wheezes.  Musculoskeletal: She exhibits no edema or tenderness.  Lymphadenopathy:    She has no cervical adenopathy.  Neurological: She is alert. No cranial nerve deficit.  Oriented to self and situation, not date or place  Skin: She is not diaphoretic.  Erythema to back. Erythema to inguinal folds.  Psychiatric:  Pleasant, able f/c and answer q's    Labs reviewed/Significant Diagnostic Results:  Basic Metabolic Panel:  Recent Labs  02/15/14 0541 02/16/14 0511 02/17/14 0444  NA 132* 137 139  K 3.4* 3.1* 3.8  CL 95* 101 103  CO2 21 24 25   GLUCOSE 159* 148* 100*  BUN 10 10 12   CREATININE 0.73 0.91 0.88  CALCIUM 8.8 8.0* 8.6  MG  1.8 1.7  --    Liver Function Tests:  Recent Labs  02/15/14 0541  AST 19  ALT 12  ALKPHOS 57  BILITOT 0.3  PROT 7.0  ALBUMIN 3.1*   No results for input(s): LIPASE, AMYLASE in the last 8760 hours. No results for input(s): AMMONIA in the last 8760 hours. CBC:  Recent Labs  02/14/14 2159 02/15/14 0541 02/16/14 0511  WBC 10.8* 13.1* 7.3  NEUTROABS 9.0* 12.4*  --   HGB 13.4 11.3* 8.9*  HCT 40.0 35.8* 26.9*  MCV 91.7 93.7 92.8  PLT 245 195 194   CBG:  Recent Labs  02/16/14 0433 02/16/14 0824 02/16/14 1314  GLUCAP 161* 92 108*   TSH: No results for input(s): TSH in the last 8760 hours.  A1C: No results found for: HGBA1C Lipid Panel:  Recent Labs  08/07/13  CHOL 171  HDL 42  LDLCALC 109  TRIG 102       Assessment/Plan Essential hypertension Her BP has been slightly elevated ranging 119-158/64-93.  She has experienced some anxiety in the past month that may be contributing to this issue. We will continue to monitor her VS but I will not make any changes at this time due to her age and frailty. If it continues to be over 150/90 consistently, I will consider RX.    Rash and nonspecific skin eruption Lotrisone 1% BID to inguinal folds for 10 days. Hydrocortisone 1% to back BID for 10 days then prn  Anxiety state Improved with Ativan increased on 03/23/14 to TID. Continue current Ativan dose. Cymbalta increased to 60 mg on 03/04/14. Continue current dose and await response.   Constipation Stable. Continue Miralax and Senna-s   Osteoarthritis Continue scheduled Tylenol. Reports that it helps with chronic knee pain.   Anemia Improved with Iron BID. Continue current dose and monitor.   Dysphagia, pharyngoesophageal phase Stable, continue current diet of mechanical soft with ground meats. No reports of difficulty swallowing.  Memory loss or impairment I have ordered an MMSE. Her BIMS score is quite low. She is mostly non ambulatory and is incontinent. I  do not think she will respond well to aricept or namenda.     Cindi Carbon, ANP [facility] [phone]

## 2014-03-30 NOTE — Assessment & Plan Note (Signed)
Improved with Ativan increased on 03/23/14 to TID. Continue current Ativan dose. Cymbalta increased to 60 mg on 03/04/14. Continue current dose and await response.

## 2014-03-30 NOTE — Assessment & Plan Note (Signed)
Improved with Iron BID. Continue current dose and monitor.

## 2014-03-30 NOTE — Assessment & Plan Note (Signed)
Continue scheduled Tylenol. Reports that it helps with chronic knee pain.

## 2014-03-30 NOTE — Assessment & Plan Note (Signed)
Her BP has been slightly elevated ranging 119-158/64-93.  She has experienced some anxiety in the past month that may be contributing to this issue. We will continue to monitor her VS but I will not make any changes at this time due to her age and frailty. If it continues to be over 150/90 consistently, I will consider RX.

## 2014-03-30 NOTE — Assessment & Plan Note (Signed)
Lotrisone 1% BID to inguinal folds for 10 days. Hydrocortisone 1% to back BID for 10 days then prn

## 2014-03-30 NOTE — Assessment & Plan Note (Signed)
Stable, continue current diet of mechanical soft with ground meats. No reports of difficulty swallowing.

## 2014-03-30 NOTE — Assessment & Plan Note (Signed)
I have ordered an MMSE. Her BIMS score is quite low. She is mostly non ambulatory and is incontinent. I do not think she will respond well to aricept or namenda.

## 2014-04-13 ENCOUNTER — Non-Acute Institutional Stay (SKILLED_NURSING_FACILITY): Payer: Medicare Other | Admitting: Adult Health

## 2014-04-13 DIAGNOSIS — J069 Acute upper respiratory infection, unspecified: Secondary | ICD-10-CM

## 2014-04-13 DIAGNOSIS — S2232XA Fracture of one rib, left side, initial encounter for closed fracture: Secondary | ICD-10-CM

## 2014-04-13 DIAGNOSIS — S2239XA Fracture of one rib, unspecified side, initial encounter for closed fracture: Secondary | ICD-10-CM | POA: Insufficient documentation

## 2014-04-13 NOTE — Assessment & Plan Note (Signed)
Most likely due to cough. She is not in pain. Monitor 02 sat and s/s pain or resp distress.

## 2014-04-13 NOTE — Assessment & Plan Note (Signed)
Augmentin 875 mg BID x10days and Florastor BID for 10days. Begin Prilosec 20mg  BID as well. Continue robitussin and Duonebs.

## 2014-04-13 NOTE — Progress Notes (Signed)
Patient ID: [name], female   DOB: 1916-05-05, 80 y.o.   MRN: [id number]  Nursing Home Location:  [facility]   Code Status: DNR   Place of Service: SNF (31)  Chief Complaint  Patient presents with  . Acute Visit    cough, rib fx    HPI:  80 y.o. female residing at [facility], skilled care section. I was asked to see her today due to a cough that has been present for two months. A CXR was ordered yesterday revealing rib fractures on the left hemithorax (number not specified) with no pna but chronic lung parenchymal changes. She is currently on Duonebs and this seems to have helped. She has not had a fever, SOB, or CP.  She is demented and can not provide an accurate hx but denies pain to her ribs or SOB. The nurse reports that she has sputum production that is typically yellow or white. She was previously followed by speech therapy and is on a mechanical soft diet with chopped meats. There have been no recent bouts of pna.    Review of Systems:  Review of Systems  Constitutional: Negative for fever, chills, diaphoresis, activity change, appetite change and fatigue.  HENT: Positive for congestion. Negative for postnasal drip, rhinorrhea, sore throat and trouble swallowing.   Eyes: Negative for pain, discharge and itching.  Respiratory: Positive for cough. Negative for choking, chest tightness, shortness of breath and wheezing.   Cardiovascular: Negative for chest pain, palpitations and leg swelling.  Gastrointestinal: Negative for abdominal pain and abdominal distention.  Allergic/Immunologic: Positive for environmental allergies. Negative for food allergies.  Neurological: Negative for dizziness, tremors, speech difficulty and weakness.  Psychiatric/Behavioral: Positive for confusion. The patient is nervous/anxious.     Medications: Patient's Medications  New Prescriptions   No medications on file  Previous Medications   ACETAMINOPHEN (TYLENOL) 325 MG TABLET    Take 650 mg by mouth 2 (two) times daily. May have additional 650mg  daily as needed for pain relief   AMOXICILLIN-CLAVULANATE (AUGMENTIN) 875-125 MG PER TABLET    Take 1 tablet by mouth 2 (two) times daily.   DULOXETINE (CYMBALTA) 60 MG CAPSULE    Take 60 mg by mouth daily.   FEEDING SUPPLEMENT, RESOURCE BREEZE, (RESOURCE BREEZE) LIQD    Take 1 Container by mouth 3 (three) times daily between meals.   FERROUS SULFATE 325 (65 FE) MG TABLET    Take 325 mg by mouth 2 (two) times daily with a meal.   FLUTICASONE (FLONASE) 50 MCG/ACT NASAL SPRAY    Place 1 spray into both nostrils daily.    FUROSEMIDE (LASIX) 40 MG TABLET    Take 40 mg by mouth daily.   IPRATROPIUM-ALBUTEROL (DUONEB) 0.5-2.5 (3) MG/3ML SOLN    Take 3 mLs by nebulization every 6 (six) hours as needed (wheezing).   LORAZEPAM (ATIVAN) 0.5 MG TABLET    Take 0.5 mg by mouth 3 (three) times daily.    LORAZEPAM (ATIVAN) 0.5 MG TABLET    Take 0.5 mg by mouth 2 (two) times daily as needed (restlessness).   MELATONIN 5 MG TABS    Take 1 tablet by mouth at bedtime. For insomnia   METHOCARBAMOL (ROBAXIN) 500 MG TABLET    Take 1 tablet (500 mg total) by mouth every 6 (six) hours as needed for muscle spasms.   METOPROLOL SUCCINATE (TOPROL-XL) 25 MG 24 HR TABLET    Take 12.5 mg by mouth at bedtime. For HTN  NYSTATIN (MYCOSTATIN/NYSTOP) 100000 UNIT/GM POWD    Apply 1 Bottle topically 2 (two) times daily. To reddened area, bilateral groin unitl 11/5   OVER THE COUNTER MEDICATION    Apply 1 application topically 2 (two) times daily. Aveeno moisturizing - To lower legs/feet   OXYCODONE (OXY IR/ROXICODONE) 5 MG IMMEDIATE RELEASE TABLET    Take 1-2 tablets (5-10 mg total) by mouth every 4 (four) hours as needed for moderate pain, severe pain or breakthrough pain.   POLYETHYLENE GLYCOL (MIRALAX / GLYCOLAX) PACKET    Take 17 g by mouth daily.   POTASSIUM CHLORIDE SA (K-DUR,KLOR-CON) 20 MEQ TABLET    Take 20 mEq by  mouth once.   SENNOSIDES-DOCUSATE SODIUM (SENOKOT-S) 8.6-50 MG TABLET    Take 2 tablets by mouth at bedtime. For constipation   TROLAMINE SALICYLATE (ASPERCREME) 10 % CREAM    Apply 1 application topically 3 (three) times daily. Apply to both knees  Modified Medications   No medications on file  Discontinued Medications   No medications on file     Physical Exam:  Filed Vitals:   04/13/14 1108  BP: 134/67  Pulse: 77  Temp: 98.8 F (37.1 C)  Resp: 20  SpO2: 94%    Physical Exam  Constitutional: No distress.  frail  HENT:  Head: Normocephalic and atraumatic.  Nose: Nose normal.  Mouth/Throat: Oropharynx is clear and moist. No oropharyngeal exudate.  Ears occluded with cerumen  Eyes: Conjunctivae are normal. Pupils are equal, round, and reactive to light. Right eye exhibits no discharge. Left eye exhibits no discharge.  Neck: No JVD present. No tracheal deviation present.  Cardiovascular: Normal rate, regular rhythm and normal heart sounds.   No murmur heard. No edema  Pulmonary/Chest: Effort normal and breath sounds normal. No respiratory distress. She has no wheezes. She has no rales.  Abdominal: Soft. Bowel sounds are normal. She exhibits no distension.  Musculoskeletal:  No tenderness to palpation of rib cage  Lymphadenopathy:    She has no cervical adenopathy.  Neurological: She is alert.  Oriented to self, able to answer qs  Skin: Skin is warm and dry. Rash noted. She is not diaphoretic.  Clearing rash to back with mild erythema, macular  Psychiatric: Affect normal.    Labs reviewed/Significant Diagnostic Results:  Basic Metabolic Panel:  Recent Labs  02/15/14 0541 02/16/14 0511 02/17/14 0444 03/05/14  NA 132* 137 139 136*  K 3.4* 3.1* 3.8 3.6  CL 95* 101 103  --   CO2 21 24 25   --   GLUCOSE 159* 148* 100*  --   BUN 10 10 12  22*  CREATININE 0.73 0.91 0.88 0.9  CALCIUM 8.8 8.0* 8.6  --   MG 1.8 1.7  --   --    Liver Function Tests:  Recent Labs   02/15/14 0541  AST 19  ALT 12  ALKPHOS 57  BILITOT 0.3  PROT 7.0  ALBUMIN 3.1*   No results for input(s): LIPASE, AMYLASE in the last 8760 hours. No results for input(s): AMMONIA in the last 8760 hours. CBC:  Recent Labs  02/14/14 2159 02/15/14 0541 02/16/14 0511 03/05/14  WBC 10.8* 13.1* 7.3 5.6  NEUTROABS 9.0* 12.4*  --   --   HGB 13.4 11.3* 8.9* 13.4  HCT 40.0 35.8* 26.9* 40  MCV 91.7 93.7 92.8  --   PLT 245 195 194 279   CBG:  Recent Labs  02/16/14 0433 02/16/14 0824 02/16/14 1314  GLUCAP 161* 92 108*   TSH:  No results for input(s): TSH in the last 8760 hours. A1C: No results found for: HGBA1C Lipid Panel:  Recent Labs  08/07/13  CHOL 171  HDL 42  LDLCALC 109  TRIG 102       Assessment/Plan  Rib fracture Most likely due to cough. She is not in pain. Monitor 02 sat and s/s pain or resp distress.  Upper respiratory infection Augmentin 875 mg BID x10days and Florastor BID for 10days. Begin Prilosec 20mg  BID as well. Continue robitussin and Duonebs.   Cindi Carbon, ANP [facility] [phone]

## 2014-04-30 ENCOUNTER — Non-Acute Institutional Stay (SKILLED_NURSING_FACILITY): Payer: Medicare Other | Admitting: Adult Health

## 2014-04-30 DIAGNOSIS — R1314 Dysphagia, pharyngoesophageal phase: Secondary | ICD-10-CM

## 2014-04-30 DIAGNOSIS — M170 Bilateral primary osteoarthritis of knee: Secondary | ICD-10-CM

## 2014-04-30 DIAGNOSIS — J069 Acute upper respiratory infection, unspecified: Secondary | ICD-10-CM

## 2014-04-30 DIAGNOSIS — F411 Generalized anxiety disorder: Secondary | ICD-10-CM

## 2014-04-30 DIAGNOSIS — D638 Anemia in other chronic diseases classified elsewhere: Secondary | ICD-10-CM

## 2014-04-30 DIAGNOSIS — K5901 Slow transit constipation: Secondary | ICD-10-CM

## 2014-04-30 DIAGNOSIS — I10 Essential (primary) hypertension: Secondary | ICD-10-CM

## 2014-04-30 NOTE — Assessment & Plan Note (Signed)
Stable with senna and Miralax.

## 2014-04-30 NOTE — Assessment & Plan Note (Signed)
Resolved

## 2014-04-30 NOTE — Assessment & Plan Note (Signed)
Stable on ferrous sulfate BID.

## 2014-04-30 NOTE — Assessment & Plan Note (Signed)
One episode of choking with bacon since our last visit, otherwise tolerating mech soft with ground meats.

## 2014-04-30 NOTE — Assessment & Plan Note (Signed)
Improved with Ativan 0.5mg  TID. Still has anxiety about being left alone but overall reduction in symptoms noted.

## 2014-04-30 NOTE — Assessment & Plan Note (Signed)
Controlled with Aspercreme to knees and scheduled Tylenol.  No prn use of oxycodone.

## 2014-04-30 NOTE — Progress Notes (Signed)
Patient ID: [name], female   DOB: [birth year], 81 y.o.   MRN: [id number]  Nursing Home Location:  [place]: SNF (31)  Chief Complaint  Patient presents with  . Medical Management of Chronic Issues    HPI: This is a 81 y.o. female residing at [facility], skilled section.  I am also here to review her chronic medical issues. She has a hx of HTN, dysphagia, right hip fx, anxiety, constipation, and memory loss. She was treated for a URI on 04/13/14 with Augmentin and this has resolved. A CXR was performed during that time that showed rib fx in the left hemithorax. She denies any SOB, CP, DOE,or pain in her ribs today.  She was in the hospital 02/14/14-02/17/14 due to a right intertrochanteric hip fx s/p ORIF. She spends most of her time in the [facility].  She has no complaints of pain today. Her weight has remained stable over the past month.  Her last BIMS score was 6/15 on 02/24/14.  There is no MMSE for review.  Review of Systems:  Review of Systems  Constitutional: Negative for fever, chills, diaphoresis, activity change, appetite change and fatigue.  HENT: Negative for congestion, postnasal drip, rhinorrhea and trouble swallowing.   Respiratory: Negative for cough, shortness of breath and wheezing.   Cardiovascular: Negative for chest pain, palpitations and leg swelling.  Gastrointestinal: Negative for nausea, abdominal pain, diarrhea, constipation, blood in stool and abdominal distention.  Genitourinary: Negative for dysuria and difficulty urinating.  Musculoskeletal: Positive for arthralgias and gait problem. Negative for back pain and joint swelling.  Skin: Negative for color change, pallor, rash and wound.  Neurological: Negative for dizziness, facial asymmetry and speech difficulty.  Psychiatric/Behavioral: Positive for confusion. Negative for suicidal ideas, hallucinations, behavioral problems, decreased concentration  and agitation. The patient is not nervous/anxious.   BIMS 6/15 (02/24/14)  Medications: Patient's Medications  New Prescriptions   No medications on file  Previous Medications   ACETAMINOPHEN (TYLENOL) 325 MG TABLET    Take 650 mg by mouth 2 (two) times daily. May have additional 650mg  daily as needed for pain relief   AMOXICILLIN-CLAVULANATE (AUGMENTIN) 875-125 MG PER TABLET    Take 1 tablet by mouth 2 (two) times daily.   DULOXETINE (CYMBALTA) 60 MG CAPSULE    Take 60 mg by mouth daily.   FEEDING SUPPLEMENT, RESOURCE BREEZE, (RESOURCE BREEZE) LIQD    Take 1 Container by mouth 3 (three) times daily between meals.   FERROUS SULFATE 325 (65 FE) MG TABLET    Take 325 mg by mouth 2 (two) times daily with a meal.   FLUTICASONE (FLONASE) 50 MCG/ACT NASAL SPRAY    Place 1 spray into both nostrils daily.    FUROSEMIDE (LASIX) 40 MG TABLET    Take 40 mg by mouth daily.   IPRATROPIUM-ALBUTEROL (DUONEB) 0.5-2.5 (3) MG/3ML SOLN    Take 3 mLs by nebulization every 6 (six) hours as needed (wheezing).   LORAZEPAM (ATIVAN) 0.5 MG TABLET    Take 0.5 mg by mouth 3 (three) times daily.    LORAZEPAM (ATIVAN) 0.5 MG TABLET    Take 0.5 mg by mouth 2 (two) times daily as needed (restlessness).   MELATONIN 5 MG TABS    Take 1 tablet by mouth at bedtime. For insomnia   METHOCARBAMOL (ROBAXIN) 500 MG TABLET    Take 1 tablet (500 mg total) by mouth every 6 (six) hours as needed for muscle spasms.  METOPROLOL SUCCINATE (TOPROL-XL) 25 MG 24 HR TABLET    Take 12.5 mg by mouth at bedtime. For HTN   NYSTATIN (MYCOSTATIN/NYSTOP) 100000 UNIT/GM POWD    Apply 1 Bottle topically 2 (two) times daily. To reddened area, bilateral groin unitl 11/5   OVER THE COUNTER MEDICATION    Apply 1 application topically 2 (two) times daily. Aveeno moisturizing - To lower legs/feet   OXYCODONE (OXY IR/ROXICODONE) 5 MG IMMEDIATE RELEASE TABLET    Take 1-2 tablets (5-10 mg total) by mouth every 4 (four) hours as needed for moderate pain, severe  pain or breakthrough pain.   POLYETHYLENE GLYCOL (MIRALAX / GLYCOLAX) PACKET    Take 17 g by mouth daily.   POTASSIUM CHLORIDE SA (K-DUR,KLOR-CON) 20 MEQ TABLET    Take 20 mEq by mouth once.   SENNOSIDES-DOCUSATE SODIUM (SENOKOT-S) 8.6-50 MG TABLET    Take 2 tablets by mouth at bedtime. For constipation   TROLAMINE SALICYLATE (ASPERCREME) 10 % CREAM    Apply 1 application topically 3 (three) times daily. Apply to both knees  Modified Medications   No medications on file  Discontinued Medications   No medications on file     Physical Exam:  Filed Vitals:   04/30/14 1128  BP: 153/69  Pulse: 71  Temp: 98 F (36.7 C)  Resp: 20  Weight: 152 lb 8 oz (69.174 kg)  SpO2: 93%    Physical Exam  Constitutional: No distress.  Frail, thin, elderly  Neck: No JVD present. No tracheal deviation present.  Cardiovascular: Normal rate, regular rhythm, normal heart sounds and intact distal pulses.   No murmur heard. Pulmonary/Chest: Effort normal and breath sounds normal. No respiratory distress. She has no wheezes.  Musculoskeletal: She exhibits no edema or tenderness.  Lymphadenopathy:    She has no cervical adenopathy.  Neurological: She is alert. No cranial nerve deficit.  Oriented to self and situation, not date or place  Skin: She is not diaphoretic.  Erythema to back. Erythema to inguinal folds.  Psychiatric:  Pleasant, able f/c and answer q's    Labs reviewed/Significant Diagnostic Results:  Basic Metabolic Panel:  Recent Labs  02/15/14 0541 02/16/14 0511 02/17/14 0444 03/05/14  NA 132* 137 139 136*  K 3.4* 3.1* 3.8 3.6  CL 95* 101 103  --   CO2 21 24 25   --   GLUCOSE 159* 148* 100*  --   BUN 10 10 12  22*  CREATININE 0.73 0.91 0.88 0.9  CALCIUM 8.8 8.0* 8.6  --   MG 1.8 1.7  --   --    Liver Function Tests:  Recent Labs  02/15/14 0541  AST 19  ALT 12  ALKPHOS 57  BILITOT 0.3  PROT 7.0  ALBUMIN 3.1*   No results for input(s): LIPASE, AMYLASE in the last  8760 hours. No results for input(s): AMMONIA in the last 8760 hours. CBC:  Recent Labs  02/14/14 2159 02/15/14 0541 02/16/14 0511 03/05/14  WBC 10.8* 13.1* 7.3 5.6  NEUTROABS 9.0* 12.4*  --   --   HGB 13.4 11.3* 8.9* 13.4  HCT 40.0 35.8* 26.9* 40  MCV 91.7 93.7 92.8  --   PLT 245 195 194 279   CBG:  Recent Labs  02/16/14 0433 02/16/14 0824 02/16/14 1314  GLUCAP 161* 92 108*   TSH: No results for input(s): TSH in the last 8760 hours. A1C: No results found for: HGBA1C Lipid Panel:  Recent Labs  08/07/13  CHOL 171  HDL 42  LDLCALC  109  TRIG 102       Assessment/Plan Anxiety state Improved with Ativan 0.5mg  TID. Still has anxiety about being left alone but overall reduction in symptoms noted.   Constipation Stable with senna and Miralax.   Dysphagia, pharyngoesophageal phase One episode of choking with bacon since our last visit, otherwise tolerating mech soft with ground meats.   Essential hypertension BP slightly elevated today, other numbers WNL. No aggressive RX due to age and debility. Continue metoprolol. Goal 150/90   Upper respiratory infection Resolved.   Osteoarthritis Controlled with Aspercreme to knees and scheduled Tylenol.  No prn use of oxycodone.    Anemia Stable on ferrous sulfate BID.     Cindi Carbon, ANP [facility] [phone]

## 2014-04-30 NOTE — Assessment & Plan Note (Signed)
BP slightly elevated today, other numbers WNL. No aggressive RX due to age and debility. Continue metoprolol. Goal 150/90

## 2014-06-02 ENCOUNTER — Non-Acute Institutional Stay (SKILLED_NURSING_FACILITY): Payer: Medicare Other | Admitting: Adult Health

## 2014-06-02 DIAGNOSIS — S2232XA Fracture of one rib, left side, initial encounter for closed fracture: Secondary | ICD-10-CM

## 2014-06-02 DIAGNOSIS — R1314 Dysphagia, pharyngoesophageal phase: Secondary | ICD-10-CM

## 2014-06-02 DIAGNOSIS — M170 Bilateral primary osteoarthritis of knee: Secondary | ICD-10-CM

## 2014-06-02 DIAGNOSIS — D638 Anemia in other chronic diseases classified elsewhere: Secondary | ICD-10-CM

## 2014-06-02 DIAGNOSIS — J3089 Other allergic rhinitis: Secondary | ICD-10-CM

## 2014-06-02 DIAGNOSIS — F411 Generalized anxiety disorder: Secondary | ICD-10-CM

## 2014-06-02 DIAGNOSIS — I10 Essential (primary) hypertension: Secondary | ICD-10-CM | POA: Diagnosis not present

## 2014-06-02 DIAGNOSIS — R21 Rash and other nonspecific skin eruption: Secondary | ICD-10-CM | POA: Diagnosis not present

## 2014-06-02 NOTE — Progress Notes (Signed)
Patient ID: [name], female   DOB: 1916/04/28, 81 y.o.   MRN: [id number]    Nursing Home Location:  [place]: SNF (31)  Chief Complaint  Patient presents with  . Medical Management of Chronic Issues    HPI: This is a 81 y.o. female residing at [facility], skilled section.  I am also here to review her chronic medical issues. She has a hx of HTN, dysphagia and esophogeal motility disorder, right hip fx, anxiety, constipation, and memory loss. She has complaints of sputum production and rhinorrhea. She has not had a fever or purulent sputum. She is currently using Mucinex and Flonase. An upper endo was performed in 2015 showing a small hiatal hernia. A CXR was performed on 04/13/14 showing acutely fractured ribs in the left hemithorax and chronic parenchymal changes with no pna. She has a hx of esophageal dysmotility and is currently on a mech soft diet with ground meats.  She also reports chronic itching to her back with a mild rash and uses prn zyrtec with relief.  She is WC bound and they use a hoyer lift.   She has memory loss and her most recent BIMS score is 6/15 on 05/22/14.    Review of Systems:  Review of Systems  Constitutional: Negative for fever, chills, diaphoresis, activity change, appetite change and fatigue.  HENT: Positive for postnasal drip, rhinorrhea and trouble swallowing. Negative for congestion, ear pain, nosebleeds, sinus pressure, sneezing and sore throat.   Eyes: Negative for discharge and itching.  Respiratory: Negative for cough, shortness of breath and wheezing.   Cardiovascular: Negative for chest pain, palpitations and leg swelling.  Gastrointestinal: Negative for nausea, abdominal pain, diarrhea, constipation, blood in stool and abdominal distention.  Genitourinary: Negative for dysuria and difficulty urinating.  Musculoskeletal: Positive for arthralgias and gait problem. Negative for back  pain and joint swelling.  Skin: Positive for rash. Negative for color change, pallor and wound.       itching  Neurological: Negative for dizziness, facial asymmetry and speech difficulty.  Psychiatric/Behavioral: Positive for confusion. Negative for suicidal ideas, hallucinations, behavioral problems, decreased concentration and agitation. The patient is nervous/anxious.   BIMS 6/15 (02/24/14)  Medications: Patient's Medications  New Prescriptions   No medications on file  Previous Medications   ACETAMINOPHEN (TYLENOL) 325 MG TABLET    Take 650 mg by mouth 2 (two) times daily. May have additional 650mg  daily as needed for pain relief   ASPIRIN 325 MG TABLET    Take 325 mg by mouth daily.   DEXTROMETHORPHAN-GUAIFENESIN (MUCINEX DM) 30-600 MG PER 12 HR TABLET    Take 1 tablet by mouth 2 (two) times daily.   DULOXETINE (CYMBALTA) 60 MG CAPSULE    Take 60 mg by mouth daily.   FEEDING SUPPLEMENT, RESOURCE BREEZE, (RESOURCE BREEZE) LIQD    Take 1 Container by mouth 3 (three) times daily between meals.   FERROUS SULFATE 325 (65 FE) MG TABLET    Take 325 mg by mouth 2 (two) times daily with a meal.   FLUTICASONE (FLONASE) 50 MCG/ACT NASAL SPRAY    Place 1 spray into both nostrils daily.    FUROSEMIDE (LASIX) 40 MG TABLET    Take 40 mg by mouth daily.   IPRATROPIUM-ALBUTEROL (DUONEB) 0.5-2.5 (3) MG/3ML SOLN    Take 3 mLs by nebulization every 6 (six) hours as needed (wheezing).   LORAZEPAM (ATIVAN) 0.5 MG TABLET    Take 0.5 mg by  mouth 3 (three) times daily.    LORAZEPAM (ATIVAN) 0.5 MG TABLET    Take 0.5 mg by mouth 2 (two) times daily as needed (restlessness).   MELATONIN 5 MG TABS    Take 1 tablet by mouth at bedtime. For insomnia   METOPROLOL SUCCINATE (TOPROL-XL) 25 MG 24 HR TABLET    Take 12.5 mg by mouth at bedtime. For HTN   NYSTATIN (MYCOSTATIN/NYSTOP) 100000 UNIT/GM POWD    Apply 1 Bottle topically 2 (two) times daily. To reddened area, bilateral groin unitl 11/5   OMEPRAZOLE (PRILOSEC)  20 MG CAPSULE    Take 20 mg by mouth 2 (two) times daily before a meal.   OVER THE COUNTER MEDICATION    Apply 1 application topically 2 (two) times daily. Aveeno moisturizing - To lower legs/feet   POLYETHYLENE GLYCOL (MIRALAX / GLYCOLAX) PACKET    Take 17 g by mouth daily.   POTASSIUM CHLORIDE SA (K-DUR,KLOR-CON) 20 MEQ TABLET    Take 20 mEq by mouth once.   SENNOSIDES-DOCUSATE SODIUM (SENOKOT-S) 8.6-50 MG TABLET    Take 2 tablets by mouth at bedtime. For constipation   TRAMADOL (ULTRAM) 50 MG TABLET    Take 50 mg by mouth 3 (three) times daily as needed.   TROLAMINE SALICYLATE (ASPERCREME) 10 % CREAM    Apply 1 application topically 3 (three) times daily. Apply to both knees  Modified Medications   No medications on file  Discontinued Medications   AMOXICILLIN-CLAVULANATE (AUGMENTIN) 875-125 MG PER TABLET    Take 1 tablet by mouth 2 (two) times daily.   METHOCARBAMOL (ROBAXIN) 500 MG TABLET    Take 1 tablet (500 mg total) by mouth every 6 (six) hours as needed for muscle spasms.   OXYCODONE (OXY IR/ROXICODONE) 5 MG IMMEDIATE RELEASE TABLET    Take 1-2 tablets (5-10 mg total) by mouth every 4 (four) hours as needed for moderate pain, severe pain or breakthrough pain.     Physical Exam:  Filed Vitals:   06/02/14 1502  BP: 157/69  Pulse: 77  Temp: 97 F (36.1 C)  Resp: 18  Weight: 154 lb 3.2 oz (69.945 kg)  SpO2: 93%    Physical Exam  Constitutional: No distress.  Frail, thin, elderly  HENT:  Head: Normocephalic and atraumatic.  Mouth/Throat: Oropharyngeal exudate present.  Erythema to nasal turbinates with white drainage  Eyes: Conjunctivae are normal. Pupils are equal, round, and reactive to light. Right eye exhibits no discharge. Left eye exhibits no discharge.  Neck: No JVD present. No tracheal deviation present.  Cardiovascular: Normal rate, regular rhythm, normal heart sounds and intact distal pulses.   No murmur heard. Pulmonary/Chest: Effort normal and breath sounds  normal. No respiratory distress. She has no wheezes.  Abdominal: Soft. Bowel sounds are normal. She exhibits no distension. There is no tenderness.  Musculoskeletal: She exhibits no edema or tenderness.  Lymphadenopathy:    She has no cervical adenopathy.  Neurological: She is alert. No cranial nerve deficit.  Oriented to self and situation, not date or place  Skin: Skin is warm and dry. She is not diaphoretic. No erythema.  Erythema to back, mild macular rash.   Psychiatric: Affect normal.  Pleasant, able f/c and answer q's    Labs reviewed/Significant Diagnostic Results:  Basic Metabolic Panel:  Recent Labs  02/15/14 0541 02/16/14 0511 02/17/14 0444 03/05/14  NA 132* 137 139 136*  K 3.4* 3.1* 3.8 3.6  CL 95* 101 103  --   CO2 21 24 25   --  GLUCOSE 159* 148* 100*  --   BUN 10 10 12  22*  CREATININE 0.73 0.91 0.88 0.9  CALCIUM 8.8 8.0* 8.6  --   MG 1.8 1.7  --   --    Liver Function Tests:  Recent Labs  02/15/14 0541  AST 19  ALT 12  ALKPHOS 57  BILITOT 0.3  PROT 7.0  ALBUMIN 3.1*   No results for input(s): LIPASE, AMYLASE in the last 8760 hours. No results for input(s): AMMONIA in the last 8760 hours. CBC:  Recent Labs  02/14/14 2159 02/15/14 0541 02/16/14 0511 03/05/14  WBC 10.8* 13.1* 7.3 5.4  5.6  NEUTROABS 9.0* 12.4*  --   --   HGB 13.4 11.3* 8.9* 9.5*  13.4  HCT 40.0 35.8* 26.9* 29*  40  MCV 91.7 93.7 92.8  --   PLT 245 195 194 400*  279   CBG:  Recent Labs  02/16/14 0433 02/16/14 0824 02/16/14 1314  GLUCAP 161* 92 108*   TSH: No results for input(s): TSH in the last 8760 hours. A1C: No results found for: HGBA1C Lipid Panel:  Recent Labs  08/07/13  CHOL 171  HDL 42  LDLCALC 109  TRIG 102     Assessment/Plan  1. Other allergic rhinitis Begin Claritin 10 mg daily  2. Essential hypertension Controlled, continue current meds. Check BMP  3. Dysphagia, pharyngoesophageal phase Continue current diet, aspiration  precautions  4. Primary osteoarthritis of both knees Continue Tylenol scheduled and prn ultram  5. Rash and nonspecific skin eruption Most resolved, see #1  6. Anxiety state Improved with scheduled ativan   7. Anemia in other chronic diseases classified elsewhere Continue iron, check CBC  8. Rib Fx She has no pain, this was an incidental finding on xray due to cough  9. Cough Some improvement with prilosec and Mucinex. Continue and see #1   Cindi Carbon, ANP [facility] [phone]

## 2014-06-22 ENCOUNTER — Encounter: Payer: Self-pay | Admitting: Adult Health

## 2014-06-22 ENCOUNTER — Non-Acute Institutional Stay (SKILLED_NURSING_FACILITY): Payer: Medicare Other | Admitting: Adult Health

## 2014-06-22 DIAGNOSIS — L219 Seborrheic dermatitis, unspecified: Secondary | ICD-10-CM

## 2014-06-22 NOTE — Progress Notes (Signed)
Patient ID: [name], female   DOB: 07/25/1916, 81 y.o.   MRN: [id number]    Nursing Home Location:  [place]: SNF (31)  Chief Complaint  Patient presents with  . Acute Visit    face rash    HPI: This is a 81 y.o. female residing at [facility], skilled section.   She has a hx of HTN, dysphagia and esophogeal motility disorder, right hip fx, anxiety, constipation, and memory loss. The staff reports that she has had a red, flaky rash to her face for the past few days. This area does not itch and is not spreading or draining.  Review of Systems:  Review of Systems  Constitutional: Negative for fever, chills, diaphoresis and activity change.  Skin: Positive for rash. Negative for color change, pallor and wound.       itching    Medications: Patient's Medications  New Prescriptions   No medications on file  Previous Medications   ACETAMINOPHEN (TYLENOL) 325 MG TABLET    Take 650 mg by mouth 2 (two) times daily. May have additional 650mg  daily as needed for pain relief   ASPIRIN 325 MG TABLET    Take 325 mg by mouth daily.   DEXTROMETHORPHAN-GUAIFENESIN (MUCINEX DM) 30-600 MG PER 12 HR TABLET    Take 1 tablet by mouth 2 (two) times daily.   DULOXETINE (CYMBALTA) 60 MG CAPSULE    Take 60 mg by mouth daily.   FEEDING SUPPLEMENT, RESOURCE BREEZE, (RESOURCE BREEZE) LIQD    Take 1 Container by mouth 3 (three) times daily between meals.   FERROUS SULFATE 325 (65 FE) MG TABLET    Take 325 mg by mouth 2 (two) times daily with a meal.   FLUTICASONE (FLONASE) 50 MCG/ACT NASAL SPRAY    Place 1 spray into both nostrils daily.    FUROSEMIDE (LASIX) 40 MG TABLET    Take 40 mg by mouth daily.   IPRATROPIUM-ALBUTEROL (DUONEB) 0.5-2.5 (3) MG/3ML SOLN    Take 3 mLs by nebulization every 6 (six) hours as needed (wheezing).   LORAZEPAM (ATIVAN) 0.5 MG TABLET    Take 0.5 mg by mouth 3 (three) times daily.    LORAZEPAM (ATIVAN) 0.5 MG  TABLET    Take 0.5 mg by mouth 2 (two) times daily as needed (restlessness).   MELATONIN 5 MG TABS    Take 1 tablet by mouth at bedtime. For insomnia   METOPROLOL SUCCINATE (TOPROL-XL) 25 MG 24 HR TABLET    Take 12.5 mg by mouth at bedtime. For HTN   NYSTATIN (MYCOSTATIN/NYSTOP) 100000 UNIT/GM POWD    Apply 1 Bottle topically 2 (two) times daily. To reddened area, bilateral groin unitl 11/5   OMEPRAZOLE (PRILOSEC) 20 MG CAPSULE    Take 20 mg by mouth 2 (two) times daily before a meal.   OVER THE COUNTER MEDICATION    Apply 1 application topically 2 (two) times daily. Aveeno moisturizing - To lower legs/feet   POLYETHYLENE GLYCOL (MIRALAX / GLYCOLAX) PACKET    Take 17 g by mouth daily.   POTASSIUM CHLORIDE SA (K-DUR,KLOR-CON) 20 MEQ TABLET    Take 20 mEq by mouth once.   SENNOSIDES-DOCUSATE SODIUM (SENOKOT-S) 8.6-50 MG TABLET    Take 2 tablets by mouth at bedtime. For constipation   TRAMADOL (ULTRAM) 50 MG TABLET    Take 50 mg by mouth 3 (three) times daily as needed.   TROLAMINE SALICYLATE (ASPERCREME) 10 % CREAM  Apply 1 application topically 3 (three) times daily. Apply to both knees  Modified Medications   No medications on file  Discontinued Medications   No medications on file     Physical Exam:  Filed Vitals:   06/22/14 1555  BP: 143/74  Pulse: 98  Temp: 97.7 F (36.5 C)  Resp: 24  SpO2: 94%    Physical Exam  Constitutional: No distress.  Frail, thin, elderly  HENT:  Head: Normocephalic and atraumatic.  Erythema to nasal turbinates with white drainage  Eyes: Conjunctivae are normal. Pupils are equal, round, and reactive to light. Right eye exhibits no discharge. Left eye exhibits no discharge.  Neck: No JVD present. No tracheal deviation present.  Cardiovascular: Normal rate, regular rhythm, normal heart sounds and intact distal pulses.   No murmur heard. Pulmonary/Chest: Effort normal and breath sounds normal. No respiratory distress. She has no wheezes.  Abdominal:  Soft. Bowel sounds are normal. She exhibits no distension. There is no tenderness.  Musculoskeletal: She exhibits no edema or tenderness.  Lymphadenopathy:    She has no cervical adenopathy.  Neurological: She is alert. No cranial nerve deficit.  Oriented to self and situation, not date or place  Skin: Skin is warm and dry. Rash noted. She is not diaphoretic. No erythema.  Erythema to cheeks and nasolabial folds  Psychiatric: Affect normal.  Pleasant, able f/c and answer q's    Labs reviewed/Significant Diagnostic Results:  Basic Metabolic Panel:  Recent Labs  02/15/14 0541 02/16/14 0511 02/17/14 0444 03/05/14  NA 132* 137 139 136*  K 3.4* 3.1* 3.8 3.6  CL 95* 101 103  --   CO2 21 24 25   --   GLUCOSE 159* 148* 100*  --   BUN 10 10 12  22*  CREATININE 0.73 0.91 0.88 0.9  CALCIUM 8.8 8.0* 8.6  --   MG 1.8 1.7  --   --    Liver Function Tests:  Recent Labs  02/15/14 0541  AST 19  ALT 12  ALKPHOS 57  BILITOT 0.3  PROT 7.0  ALBUMIN 3.1*   No results for input(s): LIPASE, AMYLASE in the last 8760 hours. No results for input(s): AMMONIA in the last 8760 hours. CBC:  Recent Labs  02/14/14 2159 02/15/14 0541 02/16/14 0511 03/05/14  WBC 10.8* 13.1* 7.3 5.4  5.6  NEUTROABS 9.0* 12.4*  --   --   HGB 13.4 11.3* 8.9* 9.5*  13.4  HCT 40.0 35.8* 26.9* 29*  40  MCV 91.7 93.7 92.8  --   PLT 245 195 194 400*  279   CBG:  Recent Labs  02/16/14 0433 02/16/14 0824 02/16/14 1314  GLUCAP 161* 92 108*   TSH: No results for input(s): TSH in the last 8760 hours. A1C: No results found for: HGBA1C Lipid Panel:  Recent Labs  08/07/13  CHOL 171  HDL 42  LDLCALC 109  TRIG 102     Assessment/Plan  1) Seborrheic dermatitis Ketoconazole cream 2% BID to face for 4 weeks  Cindi Carbon, ANP [facility] [phone]

## 2014-07-08 ENCOUNTER — Non-Acute Institutional Stay (SKILLED_NURSING_FACILITY): Payer: Medicare Other | Admitting: Nurse Practitioner

## 2014-07-08 DIAGNOSIS — J3089 Other allergic rhinitis: Secondary | ICD-10-CM

## 2014-07-08 DIAGNOSIS — I10 Essential (primary) hypertension: Secondary | ICD-10-CM | POA: Diagnosis not present

## 2014-07-08 DIAGNOSIS — F411 Generalized anxiety disorder: Secondary | ICD-10-CM | POA: Diagnosis not present

## 2014-07-08 DIAGNOSIS — K5901 Slow transit constipation: Secondary | ICD-10-CM

## 2014-07-08 DIAGNOSIS — R413 Other amnesia: Secondary | ICD-10-CM

## 2014-07-08 DIAGNOSIS — M170 Bilateral primary osteoarthritis of knee: Secondary | ICD-10-CM

## 2014-07-08 NOTE — Progress Notes (Signed)
Patient ID: [name], female   DOB: 08-16-1916, 81 y.o.   MRN: [id number]    Nursing Home Location:  [place]: SNF (31)  Chief Complaint  Patient presents with  . Medical Management of Chronic Issues    HPI: This is a 81 y.o. female residing at [facility], skilled section. She is seen today for management of her chronic medical issues. She has a hx of HTN,osteoarthritis, anxiety, constipation, and memory loss. She is seen today in her room and just finished breakfast.  She has been eating and drinking well, feeling well but complains of a cough.  Nursing reports cough has been going on for about one week.  Cough is productive with clear sputum.  No fevers, chills or malaise.  Patient participates in activities including bingo today.    Review of Systems:  Review of Systems  Constitutional: Negative for fever, chills, diaphoresis, activity change, appetite change and fatigue.  HENT: Negative for congestion, ear pain, nosebleeds, postnasal drip, rhinorrhea, sinus pressure, sneezing, sore throat and trouble swallowing.   Eyes: Negative for discharge and itching.  Respiratory: Positive for cough. Negative for shortness of breath and wheezing.   Cardiovascular: Negative for chest pain, palpitations and leg swelling.  Gastrointestinal: Negative for abdominal pain, diarrhea, constipation, blood in stool and abdominal distention.  Genitourinary: Negative for dysuria, urgency, flank pain and difficulty urinating.  Musculoskeletal: Positive for gait problem (uses wheelchair.). Negative for back pain, joint swelling and arthralgias.  Skin: Negative for color change, pallor, rash and wound.  Neurological: Negative for dizziness, facial asymmetry and speech difficulty.  Psychiatric/Behavioral: Positive for confusion. Negative for behavioral problems and agitation. The patient is not nervous/anxious.   BIMS 6/15  (02/24/14)  Medications: Patient's Medications  New Prescriptions   No medications on file  Previous Medications   ACETAMINOPHEN (TYLENOL) 325 MG TABLET    Take 650 mg by mouth 2 (two) times daily. May have additional 650mg  daily as needed for pain relief   ASPIRIN 325 MG TABLET    Take 325 mg by mouth daily.   DEXTROMETHORPHAN-GUAIFENESIN (MUCINEX DM) 30-600 MG PER 12 HR TABLET    Take 1 tablet by mouth 2 (two) times daily.   DULOXETINE (CYMBALTA) 60 MG CAPSULE    Take 60 mg by mouth daily.   FEEDING SUPPLEMENT, RESOURCE BREEZE, (RESOURCE BREEZE) LIQD    Take 1 Container by mouth 3 (three) times daily between meals.   FERROUS SULFATE 325 (65 FE) MG TABLET    Take 325 mg by mouth 2 (two) times daily with a meal.   FLUTICASONE (FLONASE) 50 MCG/ACT NASAL SPRAY    Place 1 spray into both nostrils daily.    FUROSEMIDE (LASIX) 40 MG TABLET    Take 40 mg by mouth daily.   IPRATROPIUM-ALBUTEROL (DUONEB) 0.5-2.5 (3) MG/3ML SOLN    Take 3 mLs by nebulization every 6 (six) hours as needed (wheezing).   LORAZEPAM (ATIVAN) 0.5 MG TABLET    Take 0.5 mg by mouth 3 (three) times daily.    LORAZEPAM (ATIVAN) 0.5 MG TABLET    Take 0.5 mg by mouth 2 (two) times daily as needed (restlessness).   MELATONIN 5 MG TABS    Take 1 tablet by mouth at bedtime. For insomnia   METOPROLOL SUCCINATE (TOPROL-XL) 25 MG 24 HR TABLET    Take 12.5 mg by mouth at bedtime. For HTN   NYSTATIN (MYCOSTATIN/NYSTOP) 100000 UNIT/GM POWD    Apply 1 Bottle  topically 2 (two) times daily. To reddened area, bilateral groin unitl 11/5   OMEPRAZOLE (PRILOSEC) 20 MG CAPSULE    Take 20 mg by mouth 2 (two) times daily before a meal.   OVER THE COUNTER MEDICATION    Apply 1 application topically 2 (two) times daily. Aveeno moisturizing - To lower legs/feet   POLYETHYLENE GLYCOL (MIRALAX / GLYCOLAX) PACKET    Take 17 g by mouth daily.   POTASSIUM CHLORIDE SA (K-DUR,KLOR-CON) 20 MEQ TABLET    Take 20 mEq by mouth once.   SENNOSIDES-DOCUSATE SODIUM  (SENOKOT-S) 8.6-50 MG TABLET    Take 2 tablets by mouth at bedtime. For constipation   TRAMADOL (ULTRAM) 50 MG TABLET    Take 50 mg by mouth 3 (three) times daily as needed.   TROLAMINE SALICYLATE (ASPERCREME) 10 % CREAM    Apply 1 application topically 3 (three) times daily. Apply to both knees  Modified Medications   No medications on file  Discontinued Medications   No medications on file     Physical Exam:  Filed Vitals:   07/08/14 0858  BP: 145/82  Pulse: 76  Temp: 97.4 F (36.3 C)  Resp: 18  Weight: 159 lb 12.8 oz (72.485 kg)    Physical Exam  Constitutional: No distress.  Frail, thin, elderly  HENT:  Head: Normocephalic and atraumatic.  Mouth/Throat: No oropharyngeal exudate.  Eyes: Conjunctivae are normal. Pupils are equal, round, and reactive to light.  Neck: No JVD present. No tracheal deviation present.  Cardiovascular: Normal rate, regular rhythm, normal heart sounds and intact distal pulses.   No murmur heard. Pulmonary/Chest: Effort normal and breath sounds normal. No respiratory distress. She has no wheezes.  Abdominal: Soft. Bowel sounds are normal. She exhibits no distension. There is no tenderness.  Musculoskeletal: She exhibits no edema or tenderness.  Lymphadenopathy:    She has no cervical adenopathy.  Neurological: She is alert. No cranial nerve deficit.  Skin: Skin is warm and dry. She is not diaphoretic. No erythema.  Psychiatric: Affect normal.  Pleasant, able f/c and answer q's    Labs reviewed/Significant Diagnostic Results:  Basic Metabolic Panel:  Recent Labs  02/15/14 0541 02/16/14 0511 02/17/14 0444 03/05/14  NA 132* 137 139 136*  K 3.4* 3.1* 3.8 3.6  CL 95* 101 103  --   CO2 21 24 25   --   GLUCOSE 159* 148* 100*  --   BUN 10 10 12  22*  CREATININE 0.73 0.91 0.88 0.9  CALCIUM 8.8 8.0* 8.6  --   MG 1.8 1.7  --   --    Liver Function Tests:  Recent Labs  02/15/14 0541  AST 19  ALT 12  ALKPHOS 57  BILITOT 0.3  PROT 7.0   ALBUMIN 3.1*   No results for input(s): LIPASE, AMYLASE in the last 8760 hours. No results for input(s): AMMONIA in the last 8760 hours. CBC:  Recent Labs  02/14/14 2159 02/15/14 0541 02/16/14 0511 03/05/14  WBC 10.8* 13.1* 7.3 5.4  5.6  NEUTROABS 9.0* 12.4*  --   --   HGB 13.4 11.3* 8.9* 9.5*  13.4  HCT 40.0 35.8* 26.9* 29*  40  MCV 91.7 93.7 92.8  --   PLT 245 195 194 400*  279   CBG:  Recent Labs  02/16/14 0433 02/16/14 0824 02/16/14 1314  GLUCAP 161* 92 108*   TSH: No results for input(s): TSH in the last 8760 hours. A1C: No results found for: HGBA1C Lipid Panel:  Recent  Labs  08/07/13  CHOL 171  HDL 42  LDLCALC 109  TRIG 102     Assessment/Plan 1. Essential hypertension Maintained on metoprolol.  Blood pressures remain in desirable range.  BMP drawn last month and was stable.    2. Slow transit constipation No complaints. Uses Senna-S.  Continue same.   3. Primary osteoarthritis of both knees Patient takes scheduled tylenol and has prn tramadol for pain.    4. Anxiety state Is not anxious today. Continue scheduled Ativan.   5. Memory loss or impairment Not currently on any medications.  Will continue to monitor.  No behaviors.    6. Other allergic rhinitis Maintained on Flonase.  Continue same.   7. Cough  Ongoing, will have ST eval for aspiration, also will Rx for Mucinex DM BID for 1 week to see if this helps with symptoms

## 2014-08-06 ENCOUNTER — Encounter: Payer: Self-pay | Admitting: Internal Medicine

## 2014-08-10 ENCOUNTER — Non-Acute Institutional Stay (SKILLED_NURSING_FACILITY): Payer: Medicare Other | Admitting: Adult Health

## 2014-08-10 DIAGNOSIS — F411 Generalized anxiety disorder: Secondary | ICD-10-CM

## 2014-08-10 DIAGNOSIS — H9201 Otalgia, right ear: Secondary | ICD-10-CM | POA: Diagnosis not present

## 2014-08-10 DIAGNOSIS — I10 Essential (primary) hypertension: Secondary | ICD-10-CM | POA: Diagnosis not present

## 2014-08-10 DIAGNOSIS — D638 Anemia in other chronic diseases classified elsewhere: Secondary | ICD-10-CM

## 2014-08-10 DIAGNOSIS — R059 Cough, unspecified: Secondary | ICD-10-CM

## 2014-08-10 DIAGNOSIS — R05 Cough: Secondary | ICD-10-CM | POA: Diagnosis not present

## 2014-08-13 ENCOUNTER — Encounter: Payer: Self-pay | Admitting: Adult Health

## 2014-08-13 NOTE — Progress Notes (Signed)
Patient ID: [name], female   DOB: 1917/01/30, 81 y.o.   MRN: [id number]    Nursing Home Location:  [place]: SNF (31)  Chief Complaint  Patient presents with  . Acute Visit    right ear ache  . Medical Management of Chronic Issues    HPI: This is a 81 y.o. female residing at [facility], skilled section. She is seen today for management of her chronic medical issues.  She has complaints of right ear pain that have been present for two days. She has not had a fever and denies congestion or SOB. She has chronic rhinorrhea due to allergic rhinitis.   Her weight has remained stable at 159 lbs. Her VS have been stable as well. She has memory loss and is currently WC bound but verbal and able to f/c and answer q's.    Review of Systems:  Review of Systems  Constitutional: Negative for fever, chills, diaphoresis, activity change, appetite change and fatigue.  HENT: Positive for ear pain and rhinorrhea. Negative for congestion, nosebleeds, postnasal drip, sinus pressure, sneezing, sore throat and trouble swallowing.   Eyes: Negative for discharge and itching.  Respiratory: Positive for cough. Negative for shortness of breath and wheezing.        Chronic rhinorrhea and cough  Cardiovascular: Negative for chest pain, palpitations and leg swelling.  Gastrointestinal: Negative for abdominal pain, diarrhea, constipation, blood in stool and abdominal distention.  Genitourinary: Negative for dysuria, urgency, flank pain and difficulty urinating.  Musculoskeletal: Positive for gait problem (uses wheelchair.). Negative for back pain, joint swelling and arthralgias.  Skin: Negative for color change, pallor, rash and wound.  Neurological: Negative for dizziness, facial asymmetry and speech difficulty.  Psychiatric/Behavioral: Positive for confusion. Negative for behavioral problems and agitation. The patient is not nervous/anxious.     BIMS 6/15 (02/24/14)  Medications: Patient's Medications  New Prescriptions   No medications on file  Previous Medications   ACETAMINOPHEN (TYLENOL) 325 MG TABLET    Take 650 mg by mouth 2 (two) times daily. May have additional 650mg  daily as needed for pain relief   ASPIRIN 325 MG TABLET    Take 325 mg by mouth daily.   DULOXETINE (CYMBALTA) 60 MG CAPSULE    Take 60 mg by mouth daily.   FEEDING SUPPLEMENT, RESOURCE BREEZE, (RESOURCE BREEZE) LIQD    Take 1 Container by mouth 3 (three) times daily between meals.   FERROUS SULFATE 325 (65 FE) MG TABLET    Take 325 mg by mouth 2 (two) times daily with a meal.   FLUTICASONE (FLONASE) 50 MCG/ACT NASAL SPRAY    Place 1 spray into both nostrils daily.    FUROSEMIDE (LASIX) 40 MG TABLET    Take 40 mg by mouth daily.   IPRATROPIUM-ALBUTEROL (DUONEB) 0.5-2.5 (3) MG/3ML SOLN    Take 3 mLs by nebulization every 6 (six) hours as needed (wheezing).   LORAZEPAM (ATIVAN) 0.5 MG TABLET    Take 0.5 mg by mouth 3 (three) times daily.    MELATONIN 5 MG TABS    Take 1 tablet by mouth at bedtime. For insomnia   METOPROLOL SUCCINATE (TOPROL-XL) 25 MG 24 HR TABLET    Take 12.5 mg by mouth at bedtime. For HTN   OMEPRAZOLE (PRILOSEC) 20 MG CAPSULE    Take 20 mg by mouth 2 (two) times daily before a meal.   OVER THE COUNTER MEDICATION    Apply 1 application topically  2 (two) times daily. Aveeno moisturizing - To lower legs/feet   POLYETHYLENE GLYCOL (MIRALAX / GLYCOLAX) PACKET    Take 17 g by mouth daily.   POTASSIUM CHLORIDE SA (K-DUR,KLOR-CON) 20 MEQ TABLET    Take 20 mEq by mouth once.   SENNOSIDES-DOCUSATE SODIUM (SENOKOT-S) 8.6-50 MG TABLET    Take 2 tablets by mouth at bedtime. For constipation   TRAMADOL (ULTRAM) 50 MG TABLET    Take 50 mg by mouth 3 (three) times daily as needed.   TROLAMINE SALICYLATE (ASPERCREME) 10 % CREAM    Apply 1 application topically 3 (three) times daily. Apply to both knees  Modified Medications   No medications on file   Discontinued Medications   No medications on file     Physical Exam:  Filed Vitals:   08/10/14 1112  BP: 145/80  Pulse: 70  Temp: 97.5 F (36.4 C)  Resp: 21  Weight: 159 lb 12.8 oz (72.485 kg)  SpO2: 94%    Physical Exam  Constitutional: No distress.  Frail, thin, elderly  HENT:  Head: Normocephalic and atraumatic.  Right Ear: External ear and ear canal normal.  Left Ear: Tympanic membrane, external ear and ear canal normal.  Ears:  Mouth/Throat: No oropharyngeal exudate.  Eyes: Conjunctivae are normal. Pupils are equal, round, and reactive to light.  Neck: No JVD present. No tracheal deviation present.  Cardiovascular: Normal rate, regular rhythm, normal heart sounds and intact distal pulses.   No murmur heard. Pulmonary/Chest: Effort normal and breath sounds normal. No respiratory distress. She has no wheezes.  Abdominal: Soft. Bowel sounds are normal. She exhibits no distension. There is no tenderness.  Musculoskeletal: She exhibits no edema or tenderness.  Lymphadenopathy:    She has no cervical adenopathy.  Neurological: She is alert. No cranial nerve deficit.  Skin: Skin is warm and dry. She is not diaphoretic. No erythema.  Psychiatric: Affect normal.  Pleasant, able f/c and answer q's    Labs reviewed/Significant Diagnostic Results:  Basic Metabolic Panel:  Recent Labs  02/15/14 0541 02/16/14 0511 02/17/14 0444 03/05/14  NA 132* 137 139 136*  K 3.4* 3.1* 3.8 3.6  CL 95* 101 103  --   CO2 21 24 25   --   GLUCOSE 159* 148* 100*  --   BUN 10 10 12  22*  CREATININE 0.73 0.91 0.88 0.9  CALCIUM 8.8 8.0* 8.6  --   MG 1.8 1.7  --   --    Liver Function Tests:  Recent Labs  02/15/14 0541  AST 19  ALT 12  ALKPHOS 57  BILITOT 0.3  PROT 7.0  ALBUMIN 3.1*   No results for input(s): LIPASE, AMYLASE in the last 8760 hours. No results for input(s): AMMONIA in the last 8760 hours. CBC:  Recent Labs  02/14/14 2159 02/15/14 0541 02/16/14 0511  03/05/14  WBC 10.8* 13.1* 7.3 5.4  5.6  NEUTROABS 9.0* 12.4*  --   --   HGB 13.4 11.3* 8.9* 9.5*  13.4  HCT 40.0 35.8* 26.9* 29*  40  MCV 91.7 93.7 92.8  --   PLT 245 195 194 400*  279   CBG:  Recent Labs  02/16/14 0433 02/16/14 0824 02/16/14 1314  GLUCAP 161* 92 108*   TSH: No results for input(s): TSH in the last 8760 hours. A1C: No results found for: HGBA1C Lipid Panel: No results for input(s): CHOL, HDL, LDLCALC, TRIG, CHOLHDL, LDLDIRECT in the last 8760 hours.   Assessment/Plan  1) Earache No signs of  infection, most likely due to seasonal allergies and increased fluid. May have tylenol prn   2) HTN Controlled on Lasix and Toprol  3) Anemia Improved. Decreased ferrous sulfate to once daily and get a f/u CBC  4) Cough Persistent.  Continues on Flonase, Prilosec.  She has a hx of esophageal dysmotility so this may be a contributing factor. Consider Claritin.  5) Anxiety disorder Stable on Ativan and Cymbalta  F/U CBC and BMP ordered  Cindi Carbon, [place] [phone]

## 2014-09-09 ENCOUNTER — Non-Acute Institutional Stay (SKILLED_NURSING_FACILITY): Payer: Medicare Other | Admitting: Internal Medicine

## 2014-09-09 DIAGNOSIS — M19011 Primary osteoarthritis, right shoulder: Secondary | ICD-10-CM | POA: Diagnosis not present

## 2014-09-09 DIAGNOSIS — R1314 Dysphagia, pharyngoesophageal phase: Secondary | ICD-10-CM | POA: Diagnosis not present

## 2014-09-09 DIAGNOSIS — G4700 Insomnia, unspecified: Secondary | ICD-10-CM

## 2014-09-09 DIAGNOSIS — I10 Essential (primary) hypertension: Secondary | ICD-10-CM | POA: Diagnosis not present

## 2014-09-09 DIAGNOSIS — T17908D Unspecified foreign body in respiratory tract, part unspecified causing other injury, subsequent encounter: Secondary | ICD-10-CM | POA: Diagnosis not present

## 2014-09-09 DIAGNOSIS — D509 Iron deficiency anemia, unspecified: Secondary | ICD-10-CM

## 2014-09-09 NOTE — Progress Notes (Signed)
Patient ID: [name], female   DOB: [birth year], 81 y.o.   MRN: [id number]  Location:  [facility]    Provider:  Rexene Edison. Mariea Clonts, D.O., C.M.D.  Code Status:  DNR Goals of care: Advanced Directive information Does patient have an advance directive?: Yes, Type of Advance Directive: [place];Living will;Out of facility DNR (pink MOST or yellow form), Pre-existing out of facility DNR order (yellow form or pink MOST form): Yellow form placed in chart (order not valid for inpatient use)  Chief Complaint  Patient presents with  . Medical Management of Chronic Issues    HPI:  81 yo white female SNF resident seen for med mgt of chronic diseases.  She has difficulty with chronic coughing related to her dysphagia with chronic aspiration.  She keeps a tissue to catch her sputum at all times.  It is thick, but remains clear.  She is on a regular diet with crushed meds and her family has signed a waiver permitting her to eat liberally despite her aspiration risks.    She admits that she is probably getting some arthritis and primarily c/o right shoulder discomfort when she uses her arm to brush her hair or button a blouse behind her.  She is able to use the arm.  She does not have pain when she's not using it.  She does not want medication for this.    Review of Systems:  Review of Systems  Constitutional: Positive for malaise/fatigue. Negative for fever and chills.  HENT: Negative for congestion.   Eyes: Negative for blurred vision.       Glasses  Respiratory: Positive for cough, sputum production and wheezing. Negative for shortness of breath.   Cardiovascular: Negative for chest pain and leg swelling.  Gastrointestinal: Negative for abdominal pain, constipation, blood in stool and melena.  Genitourinary: Negative for dysuria.       Urinary incontinence  Musculoskeletal: Positive for joint pain. Negative for falls.       Right shoulder, fingers/hands  Skin: Negative for  rash.  Neurological: Negative for dizziness, loss of consciousness and headaches.  Endo/Heme/Allergies: Bruises/bleeds easily.  Psychiatric/Behavioral: Positive for depression. The patient is nervous/anxious.     Past Medical History  Diagnosis Date  . Allergic rhinitis 07/23/2012  . Unspecified malignant neoplasm of skin of other and unspecified parts of face 2012  . Malignant neoplasm of breast (female), unspecified site 44  . Neoplasm of unspecified nature of breast 2000  . Vitamin D deficiency 2009  . Other and unspecified hyperlipidemia 2005  . Anemia, unspecified 2005  . Anxiety state, unspecified 2005  . Depressive disorder, not elsewhere classified 2005  . Senile cataract, unspecified 2005  . Unspecified essential hypertension 2005  . Subdural hemorrhage 2007  . External hemorrhoids without mention of complication 3785  . Unspecified venous (peripheral) insufficiency 2001  . Disturbance of salivary secretion 2007  . GERD (gastroesophageal reflux disease) 2005  . Hypertonicity of bladder 2006  . Urinary tract infection, site not specified 2013  . Other seborrheic dermatitis 2007  . Osteoarthrosis, unspecified whether generalized or localized, unspecified site 2005  . Pain in joint, shoulder region 2008  . Senile osteoporosis 2000  . Insomnia, unspecified 2005  . Abnormality of gait 2007  . Unspecified urinary incontinence 2005  . Vertebral fracture, closed 2006  . Closed fracture of sternum 2006  . Closed fracture of two ribs 2013    left 5,6th  . Pain in joint, shoulder region 10/21/2012  .  Basal cell carcinoma, face 03/20/2013  . Dysphagia     with chronic aspiration  . Hyperglycemia   . Depression   . Depression with anxiety   . Hyponatremia     Patient Active Problem List   Diagnosis Date Noted  . Rib fracture 04/13/2014  . Rash and nonspecific skin eruption 03/30/2014  . Anemia 03/30/2014  . Memory loss or impairment 03/30/2014  . Constipation 03/03/2014    . Dysphagia, pharyngoesophageal phase 02/16/2014  . Closed right hip fracture 02/14/2014  . History of depression 02/14/2014  . History of breast cancer 02/14/2014  . Hip fracture 02/14/2014  . Basal cell carcinoma, face 03/20/2013  . Anxiety state   . Depressive disorder, not elsewhere classified   . Pain in joint, shoulder region 10/21/2012  . Other specified disorder of the esophagus 08/15/2012  . Essential hypertension   . Hypertonicity of bladder   . Osteoarthritis   . Unspecified urinary incontinence   . Allergic rhinitis 07/23/2012    Allergies  Allergen Reactions  . Other     "Epidural medication" per NH record  . Sulfa Antibiotics     Medications: Patient's Medications  New Prescriptions   No medications on file  Previous Medications   ACETAMINOPHEN (TYLENOL) 325 MG TABLET    Take 650 mg by mouth 2 (two) times daily. May have additional 650mg  daily as needed for pain relief   ASPIRIN 325 MG TABLET    Take 325 mg by mouth daily.   DULOXETINE (CYMBALTA) 60 MG CAPSULE    Take 60 mg by mouth daily.   FEEDING SUPPLEMENT, RESOURCE BREEZE, (RESOURCE BREEZE) LIQD    Take 1 Container by mouth 3 (three) times daily between meals.   FERROUS SULFATE 325 (65 FE) MG TABLET    Take 325 mg by mouth 2 (two) times daily with a meal.   FLUTICASONE (FLONASE) 50 MCG/ACT NASAL SPRAY    Place 1 spray into both nostrils daily.    FUROSEMIDE (LASIX) 40 MG TABLET    Take 40 mg by mouth daily.   IPRATROPIUM-ALBUTEROL (DUONEB) 0.5-2.5 (3) MG/3ML SOLN    Take 3 mLs by nebulization every 6 (six) hours as needed (wheezing).   LORAZEPAM (ATIVAN) 0.5 MG TABLET    Take 0.5 mg by mouth 3 (three) times daily.    MELATONIN 5 MG TABS    Take 1 tablet by mouth at bedtime. For insomnia   METOPROLOL SUCCINATE (TOPROL-XL) 25 MG 24 HR TABLET    Take 12.5 mg by mouth at bedtime. For HTN   OMEPRAZOLE (PRILOSEC) 20 MG CAPSULE    Take 20 mg by mouth 2 (two) times daily before a meal.   OVER THE COUNTER  MEDICATION    Apply 1 application topically 2 (two) times daily. Aveeno moisturizing - To lower legs/feet   POLYETHYLENE GLYCOL (MIRALAX / GLYCOLAX) PACKET    Take 17 g by mouth daily.   POTASSIUM CHLORIDE SA (K-DUR,KLOR-CON) 20 MEQ TABLET    Take 20 mEq by mouth once.   SENNOSIDES-DOCUSATE SODIUM (SENOKOT-S) 8.6-50 MG TABLET    Take 2 tablets by mouth at bedtime. For constipation   TRAMADOL (ULTRAM) 50 MG TABLET    Take 50 mg by mouth 3 (three) times daily as needed.   TROLAMINE SALICYLATE (ASPERCREME) 10 % CREAM    Apply 1 application topically 3 (three) times daily. Apply to both knees  Modified Medications   No medications on file  Discontinued Medications   No medications on file  Physical Exam: Filed Vitals:   10/06/14 1212  BP: 152/74  Pulse: 74  Temp: 97.7 F (36.5 C)  Resp: 18  SpO2: 96%   There is no weight on file to calculate BMI.  Physical Exam  Constitutional: She is oriented to person, place, and time. She appears well-developed and well-nourished. No distress.  In wheelchair  Cardiovascular: Normal rate, regular rhythm, normal heart sounds and intact distal pulses.   Pulmonary/Chest: Effort normal. She has wheezes.  And rhonchi right upper lung field primarily  Abdominal: Soft. Bowel sounds are normal.  Musculoskeletal: Normal range of motion. She exhibits no edema or tenderness.  Right shoulder full ROM and no reproducible tenderness of rotator cuff or biceps tendon insertions  Neurological: She is alert and oriented to person, place, and time.  Skin: Skin is warm and dry. There is pallor.    Labs reviewed: Basic Metabolic Panel:  Recent Labs  02/15/14 0541 02/16/14 0511 02/17/14 0444 03/05/14  NA 132* 137 139 136*  K 3.4* 3.1* 3.8 3.6  CL 95* 101 103  --   CO2 21 24 25   --   GLUCOSE 159* 148* 100*  --   BUN 10 10 12  22*  CREATININE 0.73 0.91 0.88 0.9  CALCIUM 8.8 8.0* 8.6  --   MG 1.8 1.7  --   --     Liver Function Tests:  Recent Labs   02/15/14 0541  AST 19  ALT 12  ALKPHOS 57  BILITOT 0.3  PROT 7.0  ALBUMIN 3.1*    CBC:  Recent Labs  02/14/14 2159 02/15/14 0541 02/16/14 0511 03/05/14  WBC 10.8* 13.1* 7.3 5.4  5.6  NEUTROABS 9.0* 12.4*  --   --   HGB 13.4 11.3* 8.9* 9.5*  13.4  HCT 40.0 35.8* 26.9* 29*  40  MCV 91.7 93.7 92.8  --   PLT 245 195 194 400*  279    Lab Results  Component Value Date   TSH 0.76 01/06/2013   No results found for: HGBA1C Lab Results  Component Value Date   CHOL 171 08/07/2013   HDL 42 08/07/2013   LDLCALC 109 08/07/2013   TRIG 102 08/07/2013    Significant Diagnostic Results since last visit: reviewed prior hospital records with aspiration pneumonia, also swallow studies, waiver  Patient Care Team: Gayland Curry, DO as PCP - General (Geriatric Medicine) [facility] Richmond Campbell, MD as Consulting Physician (Gastroenterology) Melina Schools, MD as Consulting Physician (Orthopedic Surgery)  Assessment/Plan 1. Dysphagia, pharyngoesophageal phase -persistent problem, tries to drink plenty of water after meals -coughs a lot and brings up thick clear mucus and does have some right upper lobe rhonchi and wheezes due to #2  2. Chronic pulmonary aspiration, subsequent encounter -ongoing, monitor for fevers, malaise, weakness, and f/u cbc due to asymmetric lung sounds but suspect these sounds are chronic for her -not on special diet (has waiver), but does get pills crushed  3. Essential hypertension -bp at goal typically with current meds, but did have high this am, cont to monitor, f/u bmp  4. Primary osteoarthritis of right shoulder -bothersome during some of her adls/iadls when she must abduct or externally rotate her right arm -refuses medication for this at present and appears it's been ongoing (already in visit dxs previously)  5. Insomnia -cont melatonin; also has tid ativan for anxiety and takes cymbalta for depression  6. Anemia, iron  deficiency -cont iron supplement, f/u cbc  Family/ staff Communication: discussed with nurse  in SNF and nurse manager  Labs/tests ordered:  Cbc, bmp next draw  [place]. Clarence Dunsmore, D.O. [facility] [address], [place] [address] Cell Phone (Mon-Fri 8am-5pm):  [phone] On Call:  [phone] & follow prompts after 5pm & weekends Office Phone:  [phone] Office Fax:  [phone]

## 2014-10-06 ENCOUNTER — Encounter: Payer: Self-pay | Admitting: Internal Medicine

## 2014-10-08 LAB — BASIC METABOLIC PANEL
BUN: 31 mg/dL — AB (ref 4–21)
CREATININE: 0.9 mg/dL (ref 0.5–1.1)
GLUCOSE: 100 mg/dL
POTASSIUM: 4.1 mmol/L (ref 3.4–5.3)
Sodium: 139 mmol/L (ref 137–147)

## 2014-10-08 LAB — CBC AND DIFFERENTIAL
HCT: 38 % (ref 36–46)
HEMOGLOBIN: 12.9 g/dL (ref 12.0–16.0)
PLATELETS: 243 10*3/uL (ref 150–399)
WBC: 4.6 10*3/mL

## 2014-10-15 ENCOUNTER — Non-Acute Institutional Stay (SKILLED_NURSING_FACILITY): Payer: Medicare Other | Admitting: Adult Health

## 2014-10-15 ENCOUNTER — Encounter: Payer: Self-pay | Admitting: Adult Health

## 2014-10-15 DIAGNOSIS — J3089 Other allergic rhinitis: Secondary | ICD-10-CM

## 2014-10-15 DIAGNOSIS — I10 Essential (primary) hypertension: Secondary | ICD-10-CM | POA: Diagnosis not present

## 2014-10-15 DIAGNOSIS — F411 Generalized anxiety disorder: Secondary | ICD-10-CM | POA: Diagnosis not present

## 2014-10-15 DIAGNOSIS — D638 Anemia in other chronic diseases classified elsewhere: Secondary | ICD-10-CM | POA: Diagnosis not present

## 2014-10-15 DIAGNOSIS — R413 Other amnesia: Secondary | ICD-10-CM | POA: Diagnosis not present

## 2014-10-15 DIAGNOSIS — K224 Dyskinesia of esophagus: Secondary | ICD-10-CM | POA: Insufficient documentation

## 2014-10-15 NOTE — Progress Notes (Signed)
Patient ID: [name], female   DOB: 1916-07-27, 81 y.o.   MRN: [id number]    Nursing Home Location:  [place]: SNF (31)  Chief Complaint  Patient presents with  . Medical Management of Chronic Issues    HPI: This is a 81 y.o. female residing at [facility], skilled section. She is seen today for management of her chronic medical issues. She has a hx of htn, esophogeal dysmotility, constipation, OA with shoulder pain, and memory loss. She complaints of a chronic cough with congestion and clear sputum production. She has tried flonase, prilsec and claritin with very little relief. She has a hx of esophageal dysmotility and esophageal stricture with dilation,  but is on a regular diet because her family signed a Chartered loss adjuster. She has not had a fever or purulent sputum.   She has a hx of memory loss and is currently intermittently incontinent, propels herself in the [facility] with a walker, and her BIM score 8/15 on 08/21/14.   Review of Systems:  Review of Systems  Constitutional: Negative for fever, chills, diaphoresis, activity change, appetite change and fatigue.  HENT: Positive for postnasal drip and rhinorrhea. Negative for congestion, ear pain, nosebleeds, sinus pressure, sneezing, sore throat and trouble swallowing.   Eyes: Negative for discharge and itching.  Respiratory: Positive for cough. Negative for shortness of breath and wheezing.        Chronic rhinorrhea and cough  Cardiovascular: Negative for chest pain, palpitations and leg swelling.  Gastrointestinal: Negative for abdominal pain, diarrhea, constipation, blood in stool and abdominal distention.  Genitourinary: Negative for dysuria, urgency, flank pain and difficulty urinating.  Musculoskeletal: Positive for gait problem (uses wheelchair.). Negative for back pain, joint swelling and arthralgias.  Skin: Negative for color change, pallor, rash and wound.  Neurological:  Negative for dizziness, facial asymmetry and speech difficulty.  Psychiatric/Behavioral: Positive for confusion. Negative for behavioral problems and agitation. The patient is not nervous/anxious.     Medications: Patient's Medications  New Prescriptions   No medications on file  Previous Medications   ACETAMINOPHEN (TYLENOL) 325 MG TABLET    Take 650 mg by mouth 2 (two) times daily. May have additional 650mg  daily as needed for pain relief   ASPIRIN 325 MG TABLET    Take 325 mg by mouth daily.   DULOXETINE (CYMBALTA) 60 MG CAPSULE    Take 60 mg by mouth daily.   FEEDING SUPPLEMENT, RESOURCE BREEZE, (RESOURCE BREEZE) LIQD    Take 1 Container by mouth 3 (three) times daily between meals.   FUROSEMIDE (LASIX) 20 MG TABLET    Take 20 mg by mouth daily.   IPRATROPIUM (ATROVENT) 0.06 % NASAL SPRAY    Place 2 sprays into both nostrils 3 (three) times daily.   IPRATROPIUM-ALBUTEROL (DUONEB) 0.5-2.5 (3) MG/3ML SOLN    Take 3 mLs by nebulization every 6 (six) hours as needed (wheezing).   LORATADINE (CLARITIN) 10 MG TABLET    Take 10 mg by mouth daily.   LORAZEPAM (ATIVAN) 0.5 MG TABLET    Take 0.5 mg by mouth 3 (three) times daily.    MELATONIN 5 MG TABS    Take 1 tablet by mouth at bedtime. For insomnia   METOPROLOL SUCCINATE (TOPROL-XL) 25 MG 24 HR TABLET    Take 12.5 mg by mouth at bedtime. For HTN   MOUTHWASH COMPOUNDING BASE (MOUTHWASH-AF PO)    Take 15 mLs by mouth 4 (four) times daily.   OMEPRAZOLE (  PRILOSEC) 20 MG CAPSULE    Take 20 mg by mouth 2 (two) times daily before a meal.   OVER THE COUNTER MEDICATION    Apply 1 application topically 2 (two) times daily. Aveeno moisturizing - To lower legs/feet   POLYETHYLENE GLYCOL (MIRALAX / GLYCOLAX) PACKET    Take 17 g by mouth daily.   POTASSIUM CHLORIDE (K-DUR,KLOR-CON) 10 MEQ TABLET    Take 10 mEq by mouth 2 (two) times daily.   SENNOSIDES-DOCUSATE SODIUM (SENOKOT-S) 8.6-50 MG TABLET    Take 2 tablets by mouth at bedtime. For constipation    TRAMADOL (ULTRAM) 50 MG TABLET    Take 50 mg by mouth 3 (three) times daily as needed.   TROLAMINE SALICYLATE (ASPERCREME) 10 % CREAM    Apply 1 application topically 3 (three) times daily. Apply to both knees  Modified Medications   No medications on file  Discontinued Medications   FERROUS SULFATE 325 (65 FE) MG TABLET    Take 325 mg by mouth daily with breakfast.    FLUTICASONE (FLONASE) 50 MCG/ACT NASAL SPRAY    Place 1 spray into both nostrils daily.    FUROSEMIDE (LASIX) 40 MG TABLET    Take 40 mg by mouth daily.   OMEPRAZOLE (PRILOSEC) 20 MG CAPSULE    Take 20 mg by mouth 2 (two) times daily before a meal.   POTASSIUM CHLORIDE SA (K-DUR,KLOR-CON) 20 MEQ TABLET    Take 20 mEq by mouth once.     Physical Exam:  Filed Vitals:   10/15/14 1422  BP: 150/78  Pulse: 82  Temp: 97.8 F (36.6 C)  Resp: 17  Weight: 165 lb 9.6 oz (75.116 kg)  SpO2: 90%   Wt Readings from Last 3 Encounters:  10/15/14 165 lb 9.6 oz (75.116 kg)  08/10/14 159 lb 12.8 oz (72.485 kg)  07/08/14 159 lb 12.8 oz (72.485 kg)    Physical Exam  Constitutional: No distress.  HENT:  Head: Normocephalic and atraumatic.  Nose: Rhinorrhea present. No mucosal edema, sinus tenderness or nasal deformity. Right sinus exhibits no maxillary sinus tenderness and no frontal sinus tenderness. Left sinus exhibits no maxillary sinus tenderness and no frontal sinus tenderness.  Mouth/Throat: Oropharynx is clear and moist. No oropharyngeal exudate.  Eyes: Conjunctivae are normal. Pupils are equal, round, and reactive to light. Right eye exhibits no discharge. Left eye exhibits no discharge.  Neck: No JVD present. No tracheal deviation present. No thyromegaly present.  Pulmonary/Chest: Effort normal. No respiratory distress.  Fine crackles on both bases  Abdominal: Soft. Bowel sounds are normal. She exhibits no distension.  Lymphadenopathy:    She has no cervical adenopathy.  Neurological: She is alert.  Skin: Skin is warm  and dry. She is not diaphoretic.  Psychiatric: Affect normal.    Labs reviewed/Significant Diagnostic Results:  Basic Metabolic Panel:  Recent Labs  02/15/14 0541 02/16/14 0511 02/17/14 0444 03/05/14 10/08/14  NA 132* 137 139 136* 139  K 3.4* 3.1* 3.8 3.6 4.1  CL 95* 101 103  --   --   CO2 21 24 25   --   --   GLUCOSE 159* 148* 100*  --   --   BUN 10 10 12  22* 31*  CREATININE 0.73 0.91 0.88 0.9 0.9  CALCIUM 8.8 8.0* 8.6  --   --   MG 1.8 1.7  --   --   --    Liver Function Tests:  Recent Labs  02/15/14 0541  AST 19  ALT 12  ALKPHOS 57  BILITOT 0.3  PROT 7.0  ALBUMIN 3.1*   No results for input(s): LIPASE, AMYLASE in the last 8760 hours. No results for input(s): AMMONIA in the last 8760 hours. CBC:  Recent Labs  02/14/14 2159 02/15/14 0541 02/16/14 0511 03/05/14 10/08/14  WBC 10.8* 13.1* 7.3 5.4  5.6 4.6  NEUTROABS 9.0* 12.4*  --   --   --   HGB 13.4 11.3* 8.9* 9.5*  13.4 12.9  HCT 40.0 35.8* 26.9* 29*  40 38  MCV 91.7 93.7 92.8  --   --   PLT 245 195 194 400*  279 243   CBG:  Recent Labs  02/16/14 0433 02/16/14 0824 02/16/14 1314  GLUCAP 161* 92 108*   TSH: No results for input(s): TSH in the last 8760 hours. A1C: No results found for: HGBA1C Lipid Panel: No results for input(s): CHOL, HDL, LDLCALC, TRIG, CHOLHDL, LDLDIRECT in the last 8760 hours.   Assessment/Plan  1. Esophageal dysmotility Recommend restarting prilosec due to cough She would do better on a more restrictive diet but is not interested at this time  2. Anxiety state Improved, continue ativan and cymbalta  3. Anemia in other chronic diseases classified elsewhere Stable, d/c iron  4. Memory loss or impairment Stable functional status.    5. Essential hypertension Decrease Lasix to 20 mg daily and decreased kdur to 10 meq daily due to increasing BUN, continue metoprolol  6. Other allergic rhinitis D/C flonase and start atrovent 0.06% TID 2 sprays    Cindi Carbon, ANP [facility] [phone]

## 2014-11-26 ENCOUNTER — Non-Acute Institutional Stay (SKILLED_NURSING_FACILITY): Payer: Medicare Other | Admitting: Adult Health

## 2014-11-26 ENCOUNTER — Encounter: Payer: Self-pay | Admitting: Adult Health

## 2014-11-26 DIAGNOSIS — K224 Dyskinesia of esophagus: Secondary | ICD-10-CM

## 2014-11-26 DIAGNOSIS — Z853 Personal history of malignant neoplasm of breast: Secondary | ICD-10-CM | POA: Diagnosis not present

## 2014-11-26 DIAGNOSIS — I10 Essential (primary) hypertension: Secondary | ICD-10-CM | POA: Diagnosis not present

## 2014-11-26 DIAGNOSIS — S72001K Fracture of unspecified part of neck of right femur, subsequent encounter for closed fracture with nonunion: Secondary | ICD-10-CM

## 2014-11-26 DIAGNOSIS — D638 Anemia in other chronic diseases classified elsewhere: Secondary | ICD-10-CM

## 2014-11-26 NOTE — Progress Notes (Signed)
Patient ID: [name], female   DOB: 27-Apr-1916, 81 y.o.   MRN: [id number]    Nursing Home Location:  [place]: SNF (31)  Chief Complaint  Patient presents with  . Medical Management of Chronic Issues    HPI: This is a 81 y.o. female residing at [facility], skilled section. She is seen today for management of her chronic medical issues. She has a hx of htn, esophogeal dysmotility, constipation, allergic rhinitis, breast ca, OA with shoulder pain, and memory loss.  Her BP has been elevated ranging 160-174/80-106. She has not had any SOB, CP, or edema.   She is currently on a full strength aspirin which was started after a right hip fx with IM nailing for DVT prevention. She also is on an iron supplement for a hx of anemia but I can not find any further info on this issue. She has not had any blood in her stool or complaints of weakness or dizziness.   She chronically complains of a chronic cough with congestion and clear sputum production. She has tried flonase, prilsec and claritin with very little relief. She has a hx of esophageal dysmotility and esophageal stricture with dilation,  but is on a regular diet because her family signed a Chartered loss adjuster. She has not had a fever or purulent sputum.     Review of Systems:  Review of Systems  Constitutional: Negative for fever, chills, diaphoresis, activity change, appetite change and fatigue.  HENT: Positive for postnasal drip and rhinorrhea. Negative for congestion, ear pain, nosebleeds, sinus pressure, sneezing, sore throat and trouble swallowing.   Eyes: Negative for discharge and itching.  Respiratory: Positive for cough. Negative for shortness of breath and wheezing.        Chronic rhinorrhea and cough  Cardiovascular: Negative for chest pain, palpitations and leg swelling.  Gastrointestinal: Negative for abdominal pain, diarrhea, constipation, blood in stool and abdominal  distention.  Genitourinary: Negative for dysuria, urgency, flank pain and difficulty urinating.  Musculoskeletal: Positive for gait problem (uses wheelchair.). Negative for back pain, joint swelling and arthralgias.  Skin: Negative for color change, pallor, rash and wound.  Neurological: Negative for dizziness, facial asymmetry and speech difficulty.  Psychiatric/Behavioral: Positive for confusion. Negative for behavioral problems and agitation. The patient is not nervous/anxious.     Medications: Patient's Medications  New Prescriptions   No medications on file  Previous Medications   ACETAMINOPHEN (TYLENOL) 325 MG TABLET    Take 650 mg by mouth 2 (two) times daily. May have additional 650mg  daily as needed for pain relief   DULOXETINE (CYMBALTA) 60 MG CAPSULE    Take 60 mg by mouth daily.   FEEDING SUPPLEMENT, RESOURCE BREEZE, (RESOURCE BREEZE) LIQD    Take 1 Container by mouth 3 (three) times daily between meals.   FERROUS SULFATE 325 (65 FE) MG TABLET    Take 325 mg by mouth daily with breakfast.   FUROSEMIDE (LASIX) 20 MG TABLET    Take 20 mg by mouth daily.   IPRATROPIUM (ATROVENT) 0.06 % NASAL SPRAY    Place 2 sprays into both nostrils 3 (three) times daily.   IPRATROPIUM-ALBUTEROL (DUONEB) 0.5-2.5 (3) MG/3ML SOLN    Take 3 mLs by nebulization every 6 (six) hours as needed (wheezing).   LORATADINE (CLARITIN) 10 MG TABLET    Take 10 mg by mouth daily.   LORAZEPAM (ATIVAN) 0.5 MG TABLET    Take 0.5 mg by mouth 3 (three) times  daily.    MELATONIN 5 MG TABS    Take 1 tablet by mouth at bedtime. For insomnia   METOPROLOL SUCCINATE (TOPROL-XL) 25 MG 24 HR TABLET    Take 12.5 mg by mouth at bedtime. For HTN   MOUTHWASH COMPOUNDING BASE (MOUTHWASH-AF PO)    Take 15 mLs by mouth 4 (four) times daily.   OMEPRAZOLE (PRILOSEC) 20 MG CAPSULE    Take 20 mg by mouth 2 (two) times daily before a meal.   OVER THE COUNTER MEDICATION    Apply 1 application topically 2 (two) times daily. Aveeno  moisturizing - To lower legs/feet   POLYETHYLENE GLYCOL (MIRALAX / GLYCOLAX) PACKET    Take 17 g by mouth daily.   POTASSIUM CHLORIDE (K-DUR,KLOR-CON) 10 MEQ TABLET    Take 10 mEq by mouth daily.    SENNOSIDES-DOCUSATE SODIUM (SENOKOT-S) 8.6-50 MG TABLET    Take 2 tablets by mouth at bedtime. For constipation   TRAMADOL (ULTRAM) 50 MG TABLET    Take 50 mg by mouth 3 (three) times daily as needed.   TROLAMINE SALICYLATE (ASPERCREME) 10 % CREAM    Apply 1 application topically 3 (three) times daily. Apply to both knees  Modified Medications   No medications on file  Discontinued Medications   ASPIRIN 325 MG TABLET    Take 325 mg by mouth daily.   ASPIRIN 81 MG TABLET    Take 81 mg by mouth daily.     Physical Exam:  Filed Vitals:   11/26/14 1421  BP: 174/80  Pulse: 75  Temp: 97.7 F (36.5 C)  Resp: 20  Weight: 169 lb (76.658 kg)  SpO2: 95%   Wt Readings from Last 3 Encounters:  11/26/14 169 lb (76.658 kg)  10/15/14 165 lb 9.6 oz (75.116 kg)  08/10/14 159 lb 12.8 oz (72.485 kg)    Physical Exam  Constitutional: No distress.  HENT:  Head: Normocephalic and atraumatic.  Nose: Rhinorrhea present. No mucosal edema, sinus tenderness or nasal deformity. Right sinus exhibits no maxillary sinus tenderness and no frontal sinus tenderness. Left sinus exhibits no maxillary sinus tenderness and no frontal sinus tenderness.  Mouth/Throat: Oropharynx is clear and moist. No oropharyngeal exudate.  Eyes: Conjunctivae are normal. Pupils are equal, round, and reactive to light. Right eye exhibits no discharge. Left eye exhibits no discharge.  Neck: No JVD present. No tracheal deviation present. No thyromegaly present.  Pulmonary/Chest: Effort normal. No respiratory distress.  Fine crackles on both bases  Abdominal: Soft. Bowel sounds are normal. She exhibits no distension.  Lymphadenopathy:    She has no cervical adenopathy.  Neurological: She is alert.  Skin: Skin is warm and dry. She is  not diaphoretic.  Psychiatric: Affect normal.    Labs reviewed/Significant Diagnostic Results:  Basic Metabolic Panel:  Recent Labs  02/15/14 0541 02/16/14 0511 02/17/14 0444 03/05/14 10/08/14  NA 132* 137 139 136* 139  K 3.4* 3.1* 3.8 3.6 4.1  CL 95* 101 103  --   --   CO2 21 24 25   --   --   GLUCOSE 159* 148* 100*  --   --   BUN 10 10 12  22* 31*  CREATININE 0.73 0.91 0.88 0.9 0.9  CALCIUM 8.8 8.0* 8.6  --   --   MG 1.8 1.7  --   --   --    Liver Function Tests:  Recent Labs  02/15/14 0541  AST 19  ALT 12  ALKPHOS 57  BILITOT 0.3  PROT  7.0  ALBUMIN 3.1*   No results for input(s): LIPASE, AMYLASE in the last 8760 hours. No results for input(s): AMMONIA in the last 8760 hours. CBC:  Recent Labs  02/14/14 2159 02/15/14 0541 02/16/14 0511 03/05/14 10/08/14  WBC 10.8* 13.1* 7.3 5.4  5.6 4.6  NEUTROABS 9.0* 12.4*  --   --   --   HGB 13.4 11.3* 8.9* 9.5*  13.4 12.9  HCT 40.0 35.8* 26.9* 29*  40 38  MCV 91.7 93.7 92.8  --   --   PLT 245 195 194 400*  279 243   CBG:  Recent Labs  02/16/14 0433 02/16/14 0824 02/16/14 1314  GLUCAP 161* 92 108*   TSH: No results for input(s): TSH in the last 8760 hours. A1C: No results found for: HGBA1C Lipid Panel: No results for input(s): CHOL, HDL, LDLCALC, TRIG, CHOLHDL, LDLDIRECT in the last 8760 hours.   Assessment/Plan  1. Essential hypertension -uncontrolled but will need more consistent readings for review -BP log daily for 1 week and report in psc notebook  2. Anemia in other chronic diseases classified elsewhere -Hgb stable, d/c iron and check CBC in one month  3. Closed right hip fracture, with nonunion, subsequent encounter -previously was on aspirin for dvt prevention but no clear indication at this point as she is back to baseline.  -d/c aspirin  4. Esophageal dysmotility -continues with cough and clear sputum production -modest improvement with atrovent,, prilosec, and atrovent -continue  mech soft diet with thin liquid waiver per resident and family request  5. H/O Breast Cancer -no further monitoring due to age and debility   Cindi Carbon, [place] [phone]

## 2014-12-24 LAB — CBC AND DIFFERENTIAL
HEMATOCRIT: 43 % (ref 36–46)
Hemoglobin: 14.5 g/dL (ref 12.0–16.0)
Platelets: 236 10*3/uL (ref 150–399)
WBC: 4.5 10^3/mL

## 2014-12-29 DIAGNOSIS — Z66 Do not resuscitate: Secondary | ICD-10-CM

## 2015-01-04 ENCOUNTER — Non-Acute Institutional Stay (SKILLED_NURSING_FACILITY): Payer: Medicare Other | Admitting: Adult Health

## 2015-01-04 ENCOUNTER — Encounter: Payer: Self-pay | Admitting: Adult Health

## 2015-01-04 DIAGNOSIS — F411 Generalized anxiety disorder: Secondary | ICD-10-CM

## 2015-01-04 DIAGNOSIS — D638 Anemia in other chronic diseases classified elsewhere: Secondary | ICD-10-CM

## 2015-01-04 DIAGNOSIS — I10 Essential (primary) hypertension: Secondary | ICD-10-CM

## 2015-01-04 DIAGNOSIS — L219 Seborrheic dermatitis, unspecified: Secondary | ICD-10-CM

## 2015-01-04 DIAGNOSIS — R413 Other amnesia: Secondary | ICD-10-CM

## 2015-01-04 DIAGNOSIS — J3089 Other allergic rhinitis: Secondary | ICD-10-CM

## 2015-01-04 NOTE — Progress Notes (Signed)
Patient ID: [name], female   DOB: [birth year], 81 y.o.   MRN: [id number]    Nursing Home Location:  [place]: SNF (31)  Chief Complaint  Patient presents with  . Medical Management of Chronic Issues    HPI: This is a 82 y.o. female residing at [facility], skilled section. She is seen today for management of her chronic medical issues. She has a hx of htn, esophogeal dysmotility, constipation, allergic rhinitis, anemia, breast ca, OA with shoulder pain, and memory loss.  Psych tried a dose reduction of ativan on 8/18 but she had increased anxiety so this was increased by to 0.5 mg TID.  She was taken off iron on 8/11 and her Hgb on 9/8 remained stable at 14.5.  She was started on iron after a blood loss anemia after a hip fracture s/p repair.   She chronically complains of a chronic cough with congestion and clear sputum production. She has tried flonase, prilsec and claritin with very little relief. She has a hx of esophageal dysmotility and esophageal stricture with dilation,  but is on a regular diet because her family signed a Chartered loss adjuster. She has not had a fever or purulent sputum.    She has a hx of htn and her bp has ranged 150-164/71-78.  MMSE 20/30 with a failed clock on 12/16/14   Review of Systems:  Review of Systems  Constitutional: Negative for fever, diaphoresis, activity change, appetite change and fatigue.  HENT: Positive for postnasal drip and rhinorrhea. Negative for congestion, ear pain, nosebleeds, sinus pressure, sneezing, sore throat and trouble swallowing.   Eyes: Negative for discharge and itching.  Respiratory: Positive for cough. Negative for shortness of breath and wheezing.        Chronic rhinorrhea and cough  Cardiovascular: Negative for chest pain, palpitations and leg swelling.  Gastrointestinal: Negative for abdominal pain, diarrhea, constipation, blood in stool and abdominal distention.    Genitourinary: Negative for dysuria, urgency, flank pain and difficulty urinating.  Musculoskeletal: Positive for gait problem (uses wheelchair.). Negative for back pain, joint swelling and arthralgias.  Skin: Negative for color change, pallor, rash and wound.  Neurological: Negative for dizziness, facial asymmetry and speech difficulty.  Psychiatric/Behavioral: Positive for confusion. Negative for behavioral problems and agitation. The patient is not nervous/anxious.     Medications: Patient's Medications  New Prescriptions   No medications on file  Previous Medications   ACETAMINOPHEN (TYLENOL) 325 MG TABLET    Take 650 mg by mouth 2 (two) times daily. May have additional 650mg  daily as needed for pain relief   DULOXETINE (CYMBALTA) 60 MG CAPSULE    Take 60 mg by mouth daily.   FUROSEMIDE (LASIX) 20 MG TABLET    Take 20 mg by mouth daily.   HYDROCORTISONE CREAM 1 %    Apply 1 application topically. Apply as needed for itching   IPRATROPIUM (ATROVENT) 0.06 % NASAL SPRAY    Place 2 sprays into both nostrils 3 (three) times daily.   IPRATROPIUM-ALBUTEROL (DUONEB) 0.5-2.5 (3) MG/3ML SOLN    Take 3 mLs by nebulization every 6 (six) hours as needed (wheezing).   LORATADINE (CLARITIN) 10 MG TABLET    Take 10 mg by mouth daily.   LORAZEPAM (ATIVAN) 0.5 MG TABLET    Take 0.5 mg by mouth 3 (three) times daily. Take 0.5mg  two times daily as needed for restlessness   MELATONIN 5 MG TABS    Take 1 tablet by  mouth at bedtime. For insomnia   METOPROLOL SUCCINATE (TOPROL-XL) 25 MG 24 HR TABLET    Take 12.5 mg by mouth at bedtime. For HTN   MOUTHWASH COMPOUNDING BASE (MOUTHWASH-AF PO)    Take 15 mLs by mouth 4 (four) times daily.   OMEPRAZOLE (PRILOSEC) 20 MG CAPSULE    Take 20 mg by mouth 2 (two) times daily before a meal.   OVER THE COUNTER MEDICATION    Apply 1 application topically 2 (two) times daily. Aveeno moisturizing - To lower legs/feet   POLYETHYLENE GLYCOL (MIRALAX / GLYCOLAX) PACKET    Take  17 g by mouth daily.   POTASSIUM CHLORIDE (K-DUR,KLOR-CON) 10 MEQ TABLET    Take 10 mEq by mouth daily.    SENNOSIDES-DOCUSATE SODIUM (SENOKOT-S) 8.6-50 MG TABLET    Take 2 tablets by mouth at bedtime. For constipation   TRAMADOL (ULTRAM) 50 MG TABLET    Take 50 mg by mouth 3 (three) times daily as needed.   TROLAMINE SALICYLATE (ASPERCREME) 10 % CREAM    Apply 1 application topically 3 (three) times daily. Apply to both knees  Modified Medications   No medications on file  Discontinued Medications   No medications on file     Physical Exam:  Filed Vitals:   01/04/15 1528  BP: 150/71  Pulse: 79  Temp: 97.7 F (36.5 C)  Resp: 18  Weight: 166 lb 6.4 oz (75.479 kg)  SpO2: 95%   Wt Readings from Last 3 Encounters:  01/04/15 166 lb 6.4 oz (75.479 kg)  11/26/14 169 lb (76.658 kg)  10/15/14 165 lb 9.6 oz (75.116 kg)    Physical Exam  Constitutional: No distress.  overweight  HENT:  Head: Normocephalic and atraumatic.  Nose: Rhinorrhea present. No mucosal edema, sinus tenderness or nasal deformity. Right sinus exhibits no maxillary sinus tenderness and no frontal sinus tenderness. Left sinus exhibits no maxillary sinus tenderness and no frontal sinus tenderness.  Mouth/Throat: Oropharynx is clear and moist. No oropharyngeal exudate.  Eyes: Conjunctivae are normal. Pupils are equal, round, and reactive to light. Right eye exhibits no discharge. Left eye exhibits no discharge.  Neck: No JVD present. No tracheal deviation present. No thyromegaly present.  Cardiovascular: Normal rate and regular rhythm.   No murmur heard. Trace edema bilat  Pulmonary/Chest: Effort normal. No respiratory distress.  Fine crackles on both bases  Abdominal: Soft. Bowel sounds are normal. She exhibits no distension.  Lymphadenopathy:    She has no cervical adenopathy.  Neurological: She is alert.  Skin: Skin is warm and dry. She is not diaphoretic.  Psychiatric: Affect normal.    Labs  reviewed/Significant Diagnostic Results:  Basic Metabolic Panel:  Recent Labs  02/15/14 0541 02/16/14 0511 02/17/14 0444 03/05/14 10/08/14  NA 132* 137 139 136* 139  K 3.4* 3.1* 3.8 3.6 4.1  CL 95* 101 103  --   --   CO2 21 24 25   --   --   GLUCOSE 159* 148* 100*  --   --   BUN 10 10 12  22* 31*  CREATININE 0.73 0.91 0.88 0.9 0.9  CALCIUM 8.8 8.0* 8.6  --   --   MG 1.8 1.7  --   --   --    Liver Function Tests:  Recent Labs  02/15/14 0541  AST 19  ALT 12  ALKPHOS 57  BILITOT 0.3  PROT 7.0  ALBUMIN 3.1*   No results for input(s): LIPASE, AMYLASE in the last 8760 hours. No results for  input(s): AMMONIA in the last 8760 hours. CBC:  Recent Labs  02/14/14 2159 02/15/14 0541 02/16/14 0511 03/05/14 10/08/14 12/24/14  WBC 10.8* 13.1* 7.3 5.4  5.6 4.6 4.5  NEUTROABS 9.0* 12.4*  --   --   --   --   HGB 13.4 11.3* 8.9* 9.5*  13.4 12.9 14.5  HCT 40.0 35.8* 26.9* 29*  40 38 43  MCV 91.7 93.7 92.8  --   --   --   PLT 245 195 194 400*  279 243 236   CBG:  Recent Labs  02/16/14 0433 02/16/14 0824 02/16/14 1314  GLUCAP 161* 92 108*   TSH: No results for input(s): TSH in the last 8760 hours. A1C: No results found for: HGBA1C Lipid Panel: No results for input(s): CHOL, HDL, LDLCALC, TRIG, CHOLHDL, LDLDIRECT in the last 8760 hours.   Assessment/Plan  1. Essential hypertension -slightly elevated but would not treat, she will be 89 next month and has no symptoms -continue lasix and kdur  2. Anemia in other chronic diseases classified elsewhere -stable off iron  3. Memory loss or impairment -not on meds due to age and debility -continue to provide supportive care and participate in activities  4. Anxiety state -improved with increased dose of ativan 0.5 mg TID -failed dose reduction, continue current dose  5. Other allergic rhinitis -some improvement noted with atrovent spray in rhinorrhea -continue claritin and atrovent   6. Seborrhea -ketoconazole  cream 2% apply BID to face for 4 weeks    Cindi Carbon, ANP [facility] [phone]

## 2015-02-04 ENCOUNTER — Non-Acute Institutional Stay (SKILLED_NURSING_FACILITY): Payer: Medicare Other | Admitting: Adult Health

## 2015-02-04 DIAGNOSIS — R413 Other amnesia: Secondary | ICD-10-CM

## 2015-02-04 DIAGNOSIS — I10 Essential (primary) hypertension: Secondary | ICD-10-CM | POA: Diagnosis not present

## 2015-02-04 DIAGNOSIS — R269 Unspecified abnormalities of gait and mobility: Secondary | ICD-10-CM

## 2015-02-04 DIAGNOSIS — F411 Generalized anxiety disorder: Secondary | ICD-10-CM

## 2015-02-04 DIAGNOSIS — J3089 Other allergic rhinitis: Secondary | ICD-10-CM

## 2015-02-08 ENCOUNTER — Encounter: Payer: Self-pay | Admitting: Adult Health

## 2015-02-08 DIAGNOSIS — R269 Unspecified abnormalities of gait and mobility: Secondary | ICD-10-CM | POA: Insufficient documentation

## 2015-02-08 NOTE — Progress Notes (Signed)
Patient ID: [name], female   DOB: Mar 05, 1917, 81 y.o.   MRN: [id number]    Nursing Home Location:  [place]: SNF (31)  Chief Complaint  Patient presents with  . Medical Management of Chronic Issues    HPI: This is a 81 y.o. female residing at [facility], skilled section. She is seen today for management of her chronic medical issues. She has a hx of htn, esophogeal dysmotility, constipation, allergic rhinitis, anemia, breast ca, OA with shoulder pain, and memory loss. Her VS have been acceptable over the past month. She has no complaints today. Her weight has remained stable at 166 lbs. She has periods of anxiety and is currently on ativan and cymbalta. Failed trial reduction of ativan. MMSE 20/30 with a failed clock on 12/16/14  Functional status: non ambulatory, uses wheelchair for mobility   Review of Systems:  Review of Systems  Constitutional: Negative for fever, diaphoresis, activity change, appetite change and fatigue.  HENT: Positive for rhinorrhea. Negative for congestion, ear pain, nosebleeds, sinus pressure, sneezing, sore throat and trouble swallowing.   Eyes: Negative for discharge and itching.  Respiratory: Positive for cough. Negative for shortness of breath and wheezing.        Chronic rhinorrhea and cough  Cardiovascular: Negative for chest pain, palpitations and leg swelling.  Gastrointestinal: Negative for abdominal pain, diarrhea, constipation, blood in stool and abdominal distention.  Genitourinary: Negative for dysuria, urgency, flank pain and difficulty urinating.  Musculoskeletal: Positive for gait problem (uses wheelchair.). Negative for back pain, joint swelling and arthralgias.  Skin: Negative for color change, pallor, rash and wound.  Neurological: Negative for dizziness, facial asymmetry and speech difficulty.  Psychiatric/Behavioral: Positive for confusion. Negative for behavioral  problems and agitation. The patient is not nervous/anxious.     Medications: Patient's Medications  New Prescriptions   No medications on file  Previous Medications   ACETAMINOPHEN (TYLENOL) 325 MG TABLET    Take 650 mg by mouth 2 (two) times daily. May have additional 650mg  daily as needed for pain relief   DULOXETINE (CYMBALTA) 60 MG CAPSULE    Take 60 mg by mouth daily.   FUROSEMIDE (LASIX) 20 MG TABLET    Take 20 mg by mouth daily.   HYDROCORTISONE CREAM 1 %    Apply 1 application topically. Apply as needed for itching   IPRATROPIUM (ATROVENT) 0.06 % NASAL SPRAY    Place 2 sprays into both nostrils 3 (three) times daily.   IPRATROPIUM-ALBUTEROL (DUONEB) 0.5-2.5 (3) MG/3ML SOLN    Take 3 mLs by nebulization every 6 (six) hours as needed (wheezing).   LORATADINE (CLARITIN) 10 MG TABLET    Take 10 mg by mouth daily.   LORAZEPAM (ATIVAN) 0.5 MG TABLET    Take 0.5 mg by mouth 3 (three) times daily. Take 0.5mg  two times daily as needed for restlessness   MELATONIN 5 MG TABS    Take 1 tablet by mouth at bedtime. For insomnia   METOPROLOL SUCCINATE (TOPROL-XL) 25 MG 24 HR TABLET    Take 12.5 mg by mouth at bedtime. For HTN   MOUTHWASH COMPOUNDING BASE (MOUTHWASH-AF PO)    Take 15 mLs by mouth 4 (four) times daily.   OMEPRAZOLE (PRILOSEC) 20 MG CAPSULE    Take 20 mg by mouth 2 (two) times daily before a meal.   OVER THE COUNTER MEDICATION    Apply 1 application topically 2 (two) times daily. Aveeno moisturizing - To lower  legs/feet   POLYETHYLENE GLYCOL (MIRALAX / GLYCOLAX) PACKET    Take 17 g by mouth daily.   POTASSIUM CHLORIDE (K-DUR,KLOR-CON) 10 MEQ TABLET    Take 10 mEq by mouth daily.    SENNOSIDES-DOCUSATE SODIUM (SENOKOT-S) 8.6-50 MG TABLET    Take 2 tablets by mouth at bedtime. For constipation   TRAMADOL (ULTRAM) 50 MG TABLET    Take 50 mg by mouth 3 (three) times daily as needed.   TROLAMINE SALICYLATE (ASPERCREME) 10 % CREAM    Apply 1 application topically 3 (three) times daily.  Apply to both knees  Modified Medications   No medications on file  Discontinued Medications   No medications on file     Physical Exam:  There were no vitals filed for this visit. Wt Readings from Last 3 Encounters:  01/04/15 166 lb 6.4 oz (75.479 kg)  11/26/14 169 lb (76.658 kg)  10/15/14 165 lb 9.6 oz (75.116 kg)    Physical Exam  Constitutional: No distress.  overweight  HENT:  Head: Normocephalic and atraumatic.  Nose: Rhinorrhea present. No mucosal edema, sinus tenderness or nasal deformity. Right sinus exhibits no maxillary sinus tenderness and no frontal sinus tenderness. Left sinus exhibits no maxillary sinus tenderness and no frontal sinus tenderness.  Mouth/Throat: Oropharynx is clear and moist. No oropharyngeal exudate.  Eyes: Conjunctivae are normal. Pupils are equal, round, and reactive to light. Right eye exhibits no discharge. Left eye exhibits no discharge.  Neck: No JVD present. No tracheal deviation present. No thyromegaly present.  Cardiovascular: Normal rate and regular rhythm.   No murmur heard. Trace edema bilat  Pulmonary/Chest: Effort normal. No respiratory distress.  Fine crackles on both bases  Abdominal: Soft. Bowel sounds are normal. She exhibits no distension.  Lymphadenopathy:    She has no cervical adenopathy.  Neurological: She is alert.  Skin: Skin is warm and dry. She is not diaphoretic.  Psychiatric: Affect normal.    Labs reviewed/Significant Diagnostic Results:  Basic Metabolic Panel:  Recent Labs  02/15/14 0541 02/16/14 0511 02/17/14 0444 03/05/14 10/08/14  NA 132* 137 139 136* 139  K 3.4* 3.1* 3.8 3.6 4.1  CL 95* 101 103  --   --   CO2 21 24 25   --   --   GLUCOSE 159* 148* 100*  --   --   BUN 10 10 12  22* 31*  CREATININE 0.73 0.91 0.88 0.9 0.9  CALCIUM 8.8 8.0* 8.6  --   --   MG 1.8 1.7  --   --   --    Liver Function Tests:  Recent Labs  02/15/14 0541  AST 19  ALT 12  ALKPHOS 57  BILITOT 0.3  PROT 7.0    ALBUMIN 3.1*   No results for input(s): LIPASE, AMYLASE in the last 8760 hours. No results for input(s): AMMONIA in the last 8760 hours. CBC:  Recent Labs  02/14/14 2159 02/15/14 0541 02/16/14 0511 03/05/14 10/08/14 12/24/14  WBC 10.8* 13.1* 7.3 5.4  5.6 4.6 4.5  NEUTROABS 9.0* 12.4*  --   --   --   --   HGB 13.4 11.3* 8.9* 9.5*  13.4 12.9 14.5  HCT 40.0 35.8* 26.9* 29*  40 38 43  MCV 91.7 93.7 92.8  --   --   --   PLT 245 195 194 400*  279 243 236   CBG:  Recent Labs  02/16/14 0433 02/16/14 0824 02/16/14 1314  GLUCAP 161* 92 108*   TSH: No results for  input(s): TSH in the last 8760 hours. A1C: No results found for: HGBA1C Lipid Panel: No results for input(s): CHOL, HDL, LDLCALC, TRIG, CHOLHDL, LDLDIRECT in the last 8760 hours. Functional Status Survey: Is the patient deaf or have difficulty hearing?: Yes Does the patient have difficulty seeing, even when wearing glasses/contacts?: Yes Does the patient have difficulty concentrating, remembering, or making decisions?: Yes Does the patient have difficulty walking or climbing stairs?: Yes Does the patient have difficulty dressing or bathing?: Yes Does the patient have difficulty doing errands alone such as visiting a doctor's office or shopping?: Yes   Assessment/Plan   1. Anxiety state -improved with increased ativan dose increase -continues on cymbalta without s/e  2. Memory loss or impairment -noted but no meds due to age -continue supportive care  3. Essential hypertension -controlled with lasix and metoprolol  4. Other allergic rhinitis -has chronic runny nose, claritin and atrovent spray have helped significantly  5. Gait disorder -s/p IM nailing in 2015, completed therapy -due to weakness she is no longer ambulatory    Cindi Carbon, [place] [phone]

## 2015-02-25 NOTE — Progress Notes (Signed)
This encounter was created in error - please disregard.

## 2015-03-09 ENCOUNTER — Encounter: Payer: Self-pay | Admitting: Internal Medicine

## 2015-03-09 ENCOUNTER — Non-Acute Institutional Stay (SKILLED_NURSING_FACILITY): Payer: Medicare Other | Admitting: Internal Medicine

## 2015-03-09 DIAGNOSIS — F329 Major depressive disorder, single episode, unspecified: Secondary | ICD-10-CM

## 2015-03-09 DIAGNOSIS — Z853 Personal history of malignant neoplasm of breast: Secondary | ICD-10-CM

## 2015-03-09 DIAGNOSIS — T17908D Unspecified foreign body in respiratory tract, part unspecified causing other injury, subsequent encounter: Secondary | ICD-10-CM

## 2015-03-09 DIAGNOSIS — F32A Depression, unspecified: Secondary | ICD-10-CM

## 2015-03-09 DIAGNOSIS — K224 Dyskinesia of esophagus: Secondary | ICD-10-CM

## 2015-03-09 DIAGNOSIS — R269 Unspecified abnormalities of gait and mobility: Secondary | ICD-10-CM | POA: Diagnosis not present

## 2015-03-09 DIAGNOSIS — M170 Bilateral primary osteoarthritis of knee: Secondary | ICD-10-CM | POA: Diagnosis not present

## 2015-03-09 NOTE — Progress Notes (Signed)
Patient ID: [name], female   DOB: Aug 07, 1916, 81 y.o.   MRN: [id number]  Location:  [facility] Provider:  Rexene Edison. Elfego Giammarino, D.O., C.M.D. Hollace Kinnier, DO  Code Status:  DNR Goals of care: Advanced Directive information Does patient have an advance directive?: Yes, Type of Advance Directive: [place];Out of facility DNR (pink MOST or yellow form), Pre-existing out of facility DNR order (yellow form or pink MOST form): Yellow form placed in chart (order not valid for inpatient use)  Chief Complaint  Patient presents with  . Medical Management of Chronic Issues    HPI:  Pt is a 81 y.o. white female seen today for medical management of chronic diseases.  She has h/o breast ca s/p mastectomy, osteoporosis, depression, anxiety, dysphagia with chronic aspiration, chronic cough, hyponatremia, and OA among others.  When seen, she reported some knee pain off and on.  She told me all about her family and showed me their pictures today.  She notes some chronic postnasal drip.  She denies shortness of breath  Review of Systems  Constitutional: Positive for malaise/fatigue. Negative for fever and chills.  HENT: Negative for congestion.   Eyes: Negative for blurred vision.       Glasses  Respiratory: Negative for shortness of breath.   Cardiovascular: Negative for chest pain and leg swelling.  Gastrointestinal: Negative for abdominal pain, constipation, blood in stool and melena.  Genitourinary: Negative for dysuria.  Musculoskeletal: Positive for joint pain. Negative for falls.  Skin: Negative for rash.  Neurological: Negative for dizziness and weakness.  Psychiatric/Behavioral: Positive for depression and memory loss. The patient is nervous/anxious.     Past Medical History  Diagnosis Date  . Allergic rhinitis 07/23/2012  . Unspecified malignant neoplasm of skin of other and unspecified parts of face 2012  . Malignant neoplasm of breast (female), unspecified site  81  . Neoplasm of unspecified nature of breast 2000  . Vitamin D deficiency 2009  . Other and unspecified hyperlipidemia 2005  . Anemia, unspecified 2005  . Anxiety state, unspecified 2005  . Depressive disorder, not elsewhere classified 2005  . Senile cataract, unspecified 2005  . Unspecified essential hypertension 2005  . Subdural hemorrhage ([place]) 2007  . External hemorrhoids without mention of complication 0000000  . Unspecified venous (peripheral) insufficiency 2001  . Disturbance of salivary secretion 2007  . GERD (gastroesophageal reflux disease) 2005  . Hypertonicity of bladder 2006  . Urinary tract infection, site not specified 2013  . Other seborrheic dermatitis 2007  . Osteoarthrosis, unspecified whether generalized or localized, unspecified site 2005  . Pain in joint, shoulder region 2008  . Senile osteoporosis 2000  . Insomnia, unspecified 2005  . Abnormality of gait 2007  . Unspecified urinary incontinence 2005  . Vertebral fracture, closed 2006  . Closed fracture of sternum 2006  . Closed fracture of two ribs 2013    left 5,6th  . Pain in joint, shoulder region 10/21/2012  . Basal cell carcinoma, face 03/20/2013  . Dysphagia     with chronic aspiration  . Hyperglycemia   . Depression   . Depression with anxiety   . Hyponatremia    Past Surgical History  Procedure Laterality Date  . Joint replacement Left     Knee  . Abdominal hysterectomy    . Breast surgery Right     Mastectomy  . Appendectomy    . Mohs surgery  2010    Nose  . Cyst excision Left 2011  Calcified cyst L shin  . Esophagogastroduodenoscopy endoscopy  07/11/2013    Dr. Watt Climes hiatus hernia  . Intramedullary (im) nail intertrochanteric Right 02/15/2014    Procedure: INTRAMEDULLARY (IM) NAIL INTERTROCHANTRIC;  Surgeon: Melina Schools, MD;  Location: [facility];  Service: Orthopedics;  Laterality: Right;    Allergies  Allergen Reactions  . Other     "Epidural medication" per NH record  . Sulfa  Antibiotics       Medication List       This list is accurate as of: 03/09/15 11:59 PM.  Always use your most recent med list.               acetaminophen 325 MG tablet  Commonly known as:  TYLENOL  Take 650 mg by mouth 2 (two) times daily. May have additional 650mg  daily as needed for pain relief     DULoxetine 60 MG capsule  Commonly known as:  CYMBALTA  Take 60 mg by mouth daily.     furosemide 20 MG tablet  Commonly known as:  LASIX  Take 20 mg by mouth daily.     hydrocortisone cream 1 %  Apply 1 application topically. Apply as needed for itching     ipratropium 0.06 % nasal spray  Commonly known as:  ATROVENT  Place 2 sprays into both nostrils 3 (three) times daily.     ipratropium-albuterol 0.5-2.5 (3) MG/3ML Soln  Commonly known as:  DUONEB  Take 3 mLs by nebulization every 6 (six) hours as needed (wheezing).     loratadine 10 MG tablet  Commonly known as:  CLARITIN  Take 10 mg by mouth daily.     LORazepam 0.5 MG tablet  Commonly known as:  ATIVAN  Take 0.5 mg by mouth 3 (three) times daily. Take 0.5mg  two times daily as needed for restlessness     Melatonin 5 MG Tabs  Take 1 tablet by mouth at bedtime. For insomnia     metoprolol succinate 25 MG 24 hr tablet  Commonly known as:  TOPROL-XL  Take 12.5 mg by mouth at bedtime. For HTN     omeprazole 20 MG capsule  Commonly known as:  PRILOSEC  Take 20 mg by mouth 2 (two) times daily before a meal.     OVER THE COUNTER MEDICATION  Apply 1 application topically 2 (two) times daily. Aveeno moisturizing - To lower legs/feet     polyethylene glycol packet  Commonly known as:  MIRALAX / GLYCOLAX  Take 17 g by mouth daily.     potassium chloride 10 MEQ tablet  Commonly known as:  K-DUR,KLOR-CON  Take 10 mEq by mouth daily.     sennosides-docusate sodium 8.6-50 MG tablet  Commonly known as:  SENOKOT-S  Take 2 tablets by mouth at bedtime. For constipation     traMADol 50 MG tablet  Commonly known  as:  ULTRAM  Take 50 mg by mouth 3 (three) times daily as needed.     trolamine salicylate 10 % cream  Commonly known as:  ASPERCREME  Apply 1 application topically 3 (three) times daily. Apply to both knees        Immunization History  Administered Date(s) Administered  . Influenza Whole 01/24/2012, 01/29/2013  . Influenza-Unspecified 01/20/2014, 02/05/2015  . PPD Test 10/10/2011  . Pneumococcal Polysaccharide-23 02/10/2004   Pertinent  Health Maintenance Due  Topic Date Due  . DEXA SCAN  01/28/1984  . PNA vac Low Risk Adult (2 of 2 - PCV13) 02/09/2005  .  INFLUENZA VACCINE  11/16/2015   No flowsheet data found.  Filed Vitals:   03/09/15 1502  BP: 159/88  Pulse: 77  Temp: 97.7 F (36.5 C)  Resp: 16  Weight: 169 lb (76.658 kg)  SpO2: 96%   Body mass index is 33.01 kg/(m^2). Physical Exam  Constitutional: She appears well-developed and well-nourished.  HENT:  Mouth/Throat: Oropharynx is clear and moist. No oropharyngeal exudate.  Cardiovascular: Normal rate, regular rhythm, normal heart sounds and intact distal pulses.   Pulmonary/Chest: Effort normal and breath sounds normal.  Frequent coughing, throat clearing and spitting up sputum  Abdominal: Soft. Bowel sounds are normal.  Musculoskeletal: Normal range of motion.  Rolls around in her wheelchair  Neurological: She is alert.  Skin: Skin is warm and dry.  Psychiatric: She has a normal mood and affect.    Labs reviewed:  Recent Labs  10/08/14 04/06/15  NA 139 140  K 4.1 4.2  BUN 31* 27*  CREATININE 0.9 1.0   No results for input(s): AST, ALT, ALKPHOS, BILITOT, PROT, ALBUMIN in the last 8760 hours.  Recent Labs  10/08/14 12/24/14  WBC 4.6 4.5  HGB 12.9 14.5  HCT 38 43  PLT 243 236   Lab Results  Component Value Date   TSH 1.27 04/06/2015   No results found for: HGBA1C Lab Results  Component Value Date   CHOL 171 08/07/2013   HDL 42 08/07/2013   LDLCALC 109 08/07/2013   TRIG 102 08/07/2013     Assessment/Plan 1. Esophageal dysmotility -has chronic throat clearing and coughing up sputum related to this -cont modified diet  2. Chronic pulmonary aspiration, subsequent encounter -due to #1, cont to monitor and maintain aspiration precautions  3. History of breast cancer -s/p mastectomy many years ago -no longer getting mammograms at 82 and has no related complaints  4. Gait disorder -uses manual wheelchair to get around due to h/o falls, unsteady gait  5. Primary osteoarthritis of both knees -cont cymbalta for arthritis pain, tramadol as needed, tylenol bid  6. Depression -cont cymbalta for this, prn ativan for severe anxiety episodes, but try to avoid overuse which will worsen her already impaired memory  Family/ staff Communication: discussed with snf nursing  Labs/tests ordered:  No new  Niketa Turner L. Iyonnah Ferrante, D.O. [facility] [address], [place] [address] Cell Phone (Mon-Fri 8am-5pm):  [phone] On Call:  [phone] & follow prompts after 5pm & weekends Office Phone:  [phone] Office Fax:  [phone]

## 2015-03-30 NOTE — Progress Notes (Signed)
This encounter was created in error - please disregard.

## 2015-04-05 ENCOUNTER — Non-Acute Institutional Stay (SKILLED_NURSING_FACILITY): Payer: Medicare Other | Admitting: Adult Health

## 2015-04-05 DIAGNOSIS — I10 Essential (primary) hypertension: Secondary | ICD-10-CM

## 2015-04-05 DIAGNOSIS — F411 Generalized anxiety disorder: Secondary | ICD-10-CM

## 2015-04-05 DIAGNOSIS — J3089 Other allergic rhinitis: Secondary | ICD-10-CM

## 2015-04-05 DIAGNOSIS — K219 Gastro-esophageal reflux disease without esophagitis: Secondary | ICD-10-CM | POA: Diagnosis not present

## 2015-04-06 LAB — TSH: TSH: 1.27 u[IU]/mL (ref 0.41–5.90)

## 2015-04-06 LAB — BASIC METABOLIC PANEL
BUN: 27 mg/dL — AB (ref 4–21)
CREATININE: 1 mg/dL (ref 0.5–1.1)
Glucose: 98 mg/dL
POTASSIUM: 4.2 mmol/L (ref 3.4–5.3)
SODIUM: 140 mmol/L (ref 137–147)

## 2015-04-23 ENCOUNTER — Encounter: Payer: Self-pay | Admitting: Adult Health

## 2015-04-23 NOTE — Progress Notes (Signed)
Patient ID: [name], female   DOB: 04/16/1917, 82 y.o.   MRN: [id number]    Nursing Home Location:  [place]: SNF (31)  Chief Complaint  Patient presents with  . Medical Management of Chronic Issues    HPI: This is a 82 y.o. female residing at [facility], skilled section. She is seen today for management of her chronic medical issues. She has a hx of htn, esophogeal dysmotility, constipation, allergic rhinitis, anemia, breast ca, OA with shoulder pain, and memory loss. Her VS have been acceptable over the past month, BP ranging 138-158/75-79. She has no complaints today. Her weight has trended upward, currently at 173 lbs.  She has periods of anxiety and has loneliness at times and is currently on ativan and cymbalta. Failed trial reduction of ativan. MMSE 20/30 with a failed clock on 12/16/14 Has a hx of allergic rhinitis, currently on atrovent spray and claritin. Currently on prilosec for chronic cough with improvement noted.    Functional status: non ambulatory, uses wheelchair for mobility   Review of Systems:  Review of Systems  Constitutional: Negative for fever, diaphoresis, activity change, appetite change and fatigue.  HENT: Positive for rhinorrhea. Negative for congestion, ear pain, nosebleeds, sinus pressure, sneezing, sore throat and trouble swallowing.   Eyes: Negative for discharge and itching.  Respiratory: Positive for cough. Negative for shortness of breath and wheezing.        Chronic rhinorrhea and cough  Cardiovascular: Negative for chest pain, palpitations and leg swelling.  Gastrointestinal: Negative for abdominal pain, diarrhea, constipation, blood in stool and abdominal distention.  Genitourinary: Negative for dysuria, urgency, flank pain and difficulty urinating.  Musculoskeletal: Positive for gait problem (uses wheelchair.). Negative for back pain, joint swelling and arthralgias.  Skin:  Negative for color change, pallor, rash and wound.  Neurological: Negative for dizziness, facial asymmetry and speech difficulty.  Psychiatric/Behavioral: Positive for confusion. Negative for behavioral problems and agitation. The patient is not nervous/anxious.     Medications: Patient's Medications  New Prescriptions   No medications on file  Previous Medications   ACETAMINOPHEN (TYLENOL) 325 MG TABLET    Take 650 mg by mouth 2 (two) times daily. May have additional 650mg  daily as needed for pain relief   DULOXETINE (CYMBALTA) 60 MG CAPSULE    Take 60 mg by mouth daily.   FUROSEMIDE (LASIX) 20 MG TABLET    Take 20 mg by mouth daily.   HYDROCORTISONE CREAM 1 %    Apply 1 application topically. Apply as needed for itching   IPRATROPIUM (ATROVENT) 0.06 % NASAL SPRAY    Place 2 sprays into both nostrils 3 (three) times daily.   IPRATROPIUM-ALBUTEROL (DUONEB) 0.5-2.5 (3) MG/3ML SOLN    Take 3 mLs by nebulization every 6 (six) hours as needed (wheezing).   LORATADINE (CLARITIN) 10 MG TABLET    Take 10 mg by mouth daily.   LORAZEPAM (ATIVAN) 0.5 MG TABLET    Take 0.5 mg by mouth 3 (three) times daily. Take 0.5mg  two times daily as needed for restlessness   MELATONIN 5 MG TABS    Take 1 tablet by mouth at bedtime. For insomnia   METOPROLOL SUCCINATE (TOPROL-XL) 25 MG 24 HR TABLET    Take 12.5 mg by mouth at bedtime. For HTN   OMEPRAZOLE (PRILOSEC) 20 MG CAPSULE    Take 20 mg by mouth 2 (two) times daily before a meal.   OVER THE COUNTER MEDICATION  Apply 1 application topically 2 (two) times daily. Aveeno moisturizing - To lower legs/feet   POLYETHYLENE GLYCOL (MIRALAX / GLYCOLAX) PACKET    Take 17 g by mouth daily.   POTASSIUM CHLORIDE (K-DUR,KLOR-CON) 10 MEQ TABLET    Take 10 mEq by mouth daily.    SENNOSIDES-DOCUSATE SODIUM (SENOKOT-S) 8.6-50 MG TABLET    Take 2 tablets by mouth at bedtime. For constipation   TRAMADOL (ULTRAM) 50 MG TABLET    Take 50 mg by mouth 3 (three) times daily as  needed.   TROLAMINE SALICYLATE (ASPERCREME) 10 % CREAM    Apply 1 application topically 3 (three) times daily. Apply to both knees  Modified Medications   No medications on file  Discontinued Medications   No medications on file     Physical Exam:  Filed Vitals:   04/23/15 1254  Weight: 173 lb 12.8 oz (78.835 kg)   Wt Readings from Last 3 Encounters:  04/23/15 173 lb 12.8 oz (78.835 kg)  03/09/15 169 lb (76.658 kg)  01/04/15 166 lb 6.4 oz (75.479 kg)    Physical Exam  Constitutional: No distress.  overweight  HENT:  Head: Normocephalic and atraumatic.  Nose: Rhinorrhea present. No mucosal edema, sinus tenderness or nasal deformity. Right sinus exhibits no maxillary sinus tenderness and no frontal sinus tenderness. Left sinus exhibits no maxillary sinus tenderness and no frontal sinus tenderness.  Mouth/Throat: Oropharynx is clear and moist. No oropharyngeal exudate.  Eyes: Conjunctivae are normal. Pupils are equal, round, and reactive to light. Right eye exhibits no discharge. Left eye exhibits no discharge.  Neck: No JVD present. No tracheal deviation present. No thyromegaly present.  Cardiovascular: Normal rate and regular rhythm.   No murmur heard. Trace edema bilat  Pulmonary/Chest: Effort normal. No respiratory distress.  Fine crackles on both bases  Abdominal: Soft. Bowel sounds are normal. She exhibits no distension.  Lymphadenopathy:    She has no cervical adenopathy.  Neurological: She is alert.  Skin: Skin is warm and dry. She is not diaphoretic.  Psychiatric: Affect normal.    Labs reviewed/Significant Diagnostic Results:  Basic Metabolic Panel:  Recent Labs  10/08/14  NA 139  K 4.1  BUN 31*  CREATININE 0.9   Liver Function Tests: No results for input(s): AST, ALT, ALKPHOS, BILITOT, PROT, ALBUMIN in the last 8760 hours. No results for input(s): LIPASE, AMYLASE in the last 8760 hours. No results for input(s): AMMONIA in the last 8760  hours. CBC:  Recent Labs  10/08/14 12/24/14  WBC 4.6 4.5  HGB 12.9 14.5  HCT 38 43  PLT 243 236   CBG: No results for input(s): GLUCAP in the last 8760 hours. TSH: No results for input(s): TSH in the last 8760 hours. A1C: No results found for: HGBA1C Lipid Panel: No results for input(s): CHOL, HDL, LDLCALC, TRIG, CHOLHDL, LDLDIRECT in the last 8760 hours. Functional Status Survey:     Assessment/Plan  1. Essential hypertension -controlled, continue toprol  2. Other allergic rhinitis -stable, continue claritin -may have vasomotor rhinitis as well, atrovent helps  3. Generalized anxiety disorder -continue cymbalta and ativan  4. Gastroesophageal reflux disease without esophagitis -stable continue prilosec    Cindi Carbon, ANP [facility] [phone]

## 2015-04-26 ENCOUNTER — Other Ambulatory Visit: Payer: Self-pay | Admitting: *Deleted

## 2015-04-26 MED ORDER — LORAZEPAM 0.5 MG PO TABS: ORAL_TABLET | ORAL | Status: DC

## 2015-04-26 NOTE — Telephone Encounter (Signed)
[facility] 

## 2015-05-20 ENCOUNTER — Non-Acute Institutional Stay (SKILLED_NURSING_FACILITY): Payer: Medicare Other | Admitting: Adult Health

## 2015-05-20 ENCOUNTER — Encounter: Payer: Self-pay | Admitting: Adult Health

## 2015-05-20 DIAGNOSIS — M170 Bilateral primary osteoarthritis of knee: Secondary | ICD-10-CM | POA: Diagnosis not present

## 2015-05-20 DIAGNOSIS — K224 Dyskinesia of esophagus: Secondary | ICD-10-CM | POA: Diagnosis not present

## 2015-05-20 DIAGNOSIS — I10 Essential (primary) hypertension: Secondary | ICD-10-CM

## 2015-05-20 DIAGNOSIS — J3089 Other allergic rhinitis: Secondary | ICD-10-CM | POA: Diagnosis not present

## 2015-05-20 NOTE — Progress Notes (Signed)
Patient ID: [name], female   DOB: 17-Sep-1916, 82 y.o.   MRN: [id number]    Nursing Home Location:  [place]: SNF (31)  Chief Complaint  Patient presents with  . Medical Management of Chronic Issues    HPI: This is a 82 y.o. female residing at [facility], skilled section. She is seen today for management of her chronic medical issues. She has a hx of htn, esophogeal dysmotility, constipation, allergic rhinitis, anemia, breast ca, OA with shoulder pain, and memory loss.. She has no complaints today. Her weight is stable at 173 lbs. She has periods of anxiety and has loneliness at times and is currently on ativan and cymbalta. Failed trial reduction of ativan. MMSE 20/30 with a failed clock on 12/16/14 with a hx of memory loss, not on meds Has a hx of allergic rhinitis, currently on atrovent spray and claritin. She has a hx of esophageal dysmotility and current on a regular diet (family has signed waiver).  She has not had any episodes of aspiration pneumonia. She denies any issues with coughing or swallowing but is a poor historian.  Her BP has been well controlled on toprol without edema, cp, sob.   Functional status: non ambulatory, uses wheelchair for mobility   Review of Systems:  Review of Systems  Constitutional: Negative for fever, diaphoresis, activity change, appetite change and fatigue.  HENT: Positive for rhinorrhea. Negative for congestion, ear pain, nosebleeds, sinus pressure, sneezing, sore throat and trouble swallowing.   Eyes: Negative for discharge and itching.  Respiratory: Positive for cough. Negative for shortness of breath and wheezing.        Chronic rhinorrhea and cough  Cardiovascular: Negative for chest pain, palpitations and leg swelling.  Gastrointestinal: Negative for abdominal pain, diarrhea, constipation, blood in stool and abdominal distention.  Genitourinary: Negative for dysuria,  urgency, flank pain and difficulty urinating.  Musculoskeletal: Positive for gait problem (uses wheelchair.). Negative for back pain, joint swelling and arthralgias.  Skin: Negative for color change, pallor, rash and wound.  Neurological: Negative for dizziness, facial asymmetry and speech difficulty.  Psychiatric/Behavioral: Positive for confusion. Negative for behavioral problems and agitation. The patient is not nervous/anxious.     Medications: Patient's Medications  New Prescriptions   No medications on file  Previous Medications   ACETAMINOPHEN (TYLENOL) 325 MG TABLET    Take 650 mg by mouth 2 (two) times daily. May have additional 650mg  daily as needed for pain relief   DULOXETINE (CYMBALTA) 60 MG CAPSULE    Take 60 mg by mouth daily.   FUROSEMIDE (LASIX) 20 MG TABLET    Take 20 mg by mouth daily.   HYDROCORTISONE CREAM 1 %    Apply 1 application topically. Apply as needed for itching   IPRATROPIUM (ATROVENT) 0.06 % NASAL SPRAY    Place 2 sprays into both nostrils 3 (three) times daily.   IPRATROPIUM-ALBUTEROL (DUONEB) 0.5-2.5 (3) MG/3ML SOLN    Take 3 mLs by nebulization every 6 (six) hours as needed (wheezing).   LORATADINE (CLARITIN) 10 MG TABLET    Take 10 mg by mouth daily.   LORAZEPAM (ATIVAN) 0.5 MG TABLET    Take one tablet by mouth three times daily   MELATONIN 5 MG TABS    Take 1 tablet by mouth at bedtime. For insomnia   METOPROLOL SUCCINATE (TOPROL-XL) 25 MG 24 HR TABLET    Take 12.5 mg by mouth at bedtime. For HTN   OMEPRAZOLE (  PRILOSEC) 20 MG CAPSULE    Take 20 mg by mouth 2 (two) times daily before a meal.   OVER THE COUNTER MEDICATION    Apply 1 application topically 2 (two) times daily. Aveeno moisturizing - To lower legs/feet   POLYETHYLENE GLYCOL (MIRALAX / GLYCOLAX) PACKET    Take 17 g by mouth daily.   POTASSIUM CHLORIDE (K-DUR,KLOR-CON) 10 MEQ TABLET    Take 10 mEq by mouth daily.    SENNOSIDES-DOCUSATE SODIUM (SENOKOT-S) 8.6-50 MG TABLET    Take 2 tablets by  mouth at bedtime. For constipation   TRAMADOL (ULTRAM) 50 MG TABLET    Take 50 mg by mouth 3 (three) times daily as needed.   TROLAMINE SALICYLATE (ASPERCREME) 10 % CREAM    Apply 1 application topically 3 (three) times daily. Apply to both knees  Modified Medications   No medications on file  Discontinued Medications   No medications on file     Physical Exam:  Filed Vitals:   05/20/15 1605  BP: 143/82  Pulse: 77  Temp: 98.3 F (36.8 C)  Resp: 20  Weight: 171 lb 6.4 oz (77.747 kg)  SpO2: 95%   Wt Readings from Last 3 Encounters:  05/20/15 171 lb 6.4 oz (77.747 kg)  04/23/15 173 lb 12.8 oz (78.835 kg)  03/09/15 169 lb (76.658 kg)    Physical Exam  Constitutional: No distress.  overweight  HENT:  Head: Normocephalic and atraumatic.  Nose: Rhinorrhea present. No mucosal edema, sinus tenderness or nasal deformity. Right sinus exhibits no maxillary sinus tenderness and no frontal sinus tenderness. Left sinus exhibits no maxillary sinus tenderness and no frontal sinus tenderness.  Mouth/Throat: Oropharynx is clear and moist. No oropharyngeal exudate.  Eyes: Conjunctivae are normal. Pupils are equal, round, and reactive to light. Right eye exhibits no discharge. Left eye exhibits no discharge.  Neck: No JVD present. No tracheal deviation present. No thyromegaly present.  Cardiovascular: Normal rate and regular rhythm.   No murmur heard. Trace edema bilat  Pulmonary/Chest: Effort normal. No respiratory distress.  Fine crackles on both bases  Abdominal: Soft. Bowel sounds are normal. She exhibits no distension.  Lymphadenopathy:    She has no cervical adenopathy.  Neurological: She is alert.  Skin: Skin is warm and dry. She is not diaphoretic.  Psychiatric: Affect normal.    Labs reviewed/Significant Diagnostic Results:  Basic Metabolic Panel:  Recent Labs  10/08/14 04/06/15  NA 139 140  K 4.1 4.2  BUN 31* 27*  CREATININE 0.9 1.0   Liver Function Tests: No  results for input(s): AST, ALT, ALKPHOS, BILITOT, PROT, ALBUMIN in the last 8760 hours. No results for input(s): LIPASE, AMYLASE in the last 8760 hours. No results for input(s): AMMONIA in the last 8760 hours. CBC:  Recent Labs  10/08/14 12/24/14  WBC 4.6 4.5  HGB 12.9 14.5  HCT 38 43  PLT 243 236   CBG: No results for input(s): GLUCAP in the last 8760 hours. TSH:  Recent Labs  04/06/15  TSH 1.27   A1C: No results found for: HGBA1C Lipid Panel: No results for input(s): CHOL, HDL, LDLCALC, TRIG, CHOLHDL, LDLDIRECT in the last 8760 hours. Functional Status Survey:     Assessment/Plan  1. Essential hypertension -controlled, continue toprol  2. Primary osteoarthritis of both knees -denies pain today, continue scheduled tylenol and ultram as needed  3. Esophageal dysmotility -continue to monitor while on regular diet  4. Other allergic rhinitis -continue atroven and claritin    [employer], ANP  [place] [phone]

## 2015-06-28 ENCOUNTER — Encounter: Payer: Self-pay | Admitting: Adult Health

## 2015-06-28 ENCOUNTER — Non-Acute Institutional Stay (SKILLED_NURSING_FACILITY): Payer: Medicare Other | Admitting: Adult Health

## 2015-06-28 DIAGNOSIS — R413 Other amnesia: Secondary | ICD-10-CM

## 2015-06-28 DIAGNOSIS — I10 Essential (primary) hypertension: Secondary | ICD-10-CM

## 2015-06-28 DIAGNOSIS — M25519 Pain in unspecified shoulder: Secondary | ICD-10-CM | POA: Diagnosis not present

## 2015-06-28 DIAGNOSIS — F411 Generalized anxiety disorder: Secondary | ICD-10-CM

## 2015-06-28 DIAGNOSIS — G4700 Insomnia, unspecified: Secondary | ICD-10-CM | POA: Diagnosis not present

## 2015-06-28 NOTE — Progress Notes (Signed)
Patient ID: [name], female   DOB: 05/27/1916, 82 y.o.   MRN: [id number]    Nursing Home Location:  [place]: SNF (31)  Chief Complaint  Patient presents with  . Medical Management of Chronic Issues   Patient Care Team: Gayland Curry, DO as PCP - General (Geriatric Medicine) [facility] [place] Campbell, MD as Consulting Physician (Gastroenterology) Melina Schools, MD as Consulting Physician (Orthopedic Surgery)   HPI: This is a 82 y.o. female residing at [facility], skilled section. She is seen today for management of her chronic medical issues. She has a hx of htn, esophogeal dysmotility, constipation, allergic rhinitis, anemia, breast ca, OA with shoulder pain, and memory loss. VS stable. Weight remains stable at 172 lbs.  1. Anxiety state Pt is calm, cooperative. Denies anxiety at this time. She is pleasant, talkative. She states she enjoys going to painting classes. She has not taken her prn Ativan for the past 14 days. Her Ativan dose was decreased last August and she did not tolerate this well and her dose was increased back to .5 mg tid.   2. Arthralgia of shoulder, unspecified laterality Shoulder pain is well controlled with Tylenol. Pain denied shoulder pain for this visit. She has not needed Tramadol prn for the past 14 days.    3. Memory loss or impairment She is alert to time, place and self. Her last MMSE was 12/16/14 and she scored 20/30. She is not taking any medications for memory problems.   4. Essential hypertension Her BP has been well controlled with Toprol XL. She has trace amounts of bilateral LE edema. She wears compression stockings   5. Insomnia She states she is able to sleep with the melatonin she is given at night.   Functional status: non ambulatory, uses wheelchair for mobility   Review of Systems:  Review of Systems  Constitutional: Negative for fever,  diaphoresis, activity change, appetite change and fatigue.  HENT: Positive for rhinorrhea. Negative for congestion, ear pain, nosebleeds, sinus pressure, sneezing, sore throat and trouble swallowing.   Eyes: Negative for discharge and itching.  Respiratory: Positive for cough. Negative for shortness of breath and wheezing.        Chronic rhinorrhea and cough  Cardiovascular: Negative for chest pain, palpitations and leg swelling.  Gastrointestinal: Negative for abdominal pain, diarrhea, constipation, blood in stool and abdominal distention.  Genitourinary: Negative for dysuria, urgency, flank pain and difficulty urinating.  Musculoskeletal: Positive for gait problem (uses wheelchair.). Negative for back pain, joint swelling and arthralgias.  Skin: Negative for color change, pallor, rash and wound.  Neurological: Negative for dizziness, facial asymmetry and speech difficulty.  Psychiatric/Behavioral: Positive for confusion. Negative for behavioral problems, sleep disturbance and agitation. The patient is not nervous/anxious.   All other systems reviewed and are negative.   Medications: Patient's Medications  New Prescriptions   No medications on file  Previous Medications   ACETAMINOPHEN (TYLENOL) 325 MG TABLET    Take 650 mg by mouth 2 (two) times daily. May have additional 650mg  daily as needed for pain relief   DULOXETINE (CYMBALTA) 60 MG CAPSULE    Take 60 mg by mouth daily.   FUROSEMIDE (LASIX) 20 MG TABLET    Take 20 mg by mouth daily.   HYDROCORTISONE CREAM 1 %    Apply 1 application topically. Apply as needed for itching   IPRATROPIUM (ATROVENT) 0.06 % NASAL SPRAY    Place 2 sprays  into both nostrils 3 (three) times daily.   IPRATROPIUM-ALBUTEROL (DUONEB) 0.5-2.5 (3) MG/3ML SOLN    Take 3 mLs by nebulization every 6 (six) hours as needed (wheezing).   LORATADINE (CLARITIN) 10 MG TABLET    Take 10 mg by mouth daily.   LORAZEPAM (ATIVAN) 0.5 MG TABLET    Take one tablet by mouth three  times daily   MELATONIN 5 MG TABS    Take 1 tablet by mouth at bedtime. For insomnia   METOPROLOL SUCCINATE (TOPROL-XL) 25 MG 24 HR TABLET    Take 12.5 mg by mouth at bedtime. For HTN   OMEPRAZOLE (PRILOSEC) 20 MG CAPSULE    Take 20 mg by mouth 2 (two) times daily before a meal.   OVER THE COUNTER MEDICATION    Apply 1 application topically 2 (two) times daily. Aveeno moisturizing - To lower legs/feet   POLYETHYLENE GLYCOL (MIRALAX / GLYCOLAX) PACKET    Take 17 g by mouth daily.   POTASSIUM CHLORIDE (K-DUR,KLOR-CON) 10 MEQ TABLET    Take 10 mEq by mouth daily.    SENNOSIDES-DOCUSATE SODIUM (SENOKOT-S) 8.6-50 MG TABLET    Take 2 tablets by mouth at bedtime. For constipation   TRAMADOL (ULTRAM) 50 MG TABLET    Take 50 mg by mouth 3 (three) times daily as needed.   TROLAMINE SALICYLATE (ASPERCREME) 10 % CREAM    Apply 1 application topically 3 (three) times daily. Apply to both knees  Modified Medications   No medications on file  Discontinued Medications   No medications on file     Physical Exam:  Filed Vitals:   06/28/15 1418  Weight: 172 lb 11.2 oz (78.336 kg)   Wt Readings from Last 3 Encounters:  06/28/15 172 lb 11.2 oz (78.336 kg)  05/20/15 171 lb 6.4 oz (77.747 kg)  04/23/15 173 lb 12.8 oz (78.835 kg)    Physical Exam  Constitutional: She is well-developed, well-nourished, and in no distress. No distress.  overweight  HENT:  Head: Normocephalic and atraumatic.  Nose: Rhinorrhea present. No mucosal edema, sinus tenderness or nasal deformity. Right sinus exhibits no maxillary sinus tenderness and no frontal sinus tenderness. Left sinus exhibits no maxillary sinus tenderness and no frontal sinus tenderness.  Mouth/Throat: Oropharynx is clear and moist. No oropharyngeal exudate.  Eyes: Right eye exhibits no discharge. Left eye exhibits no discharge.  Neck: No JVD present. No tracheal deviation present. No thyromegaly present.  Cardiovascular: Normal rate, regular rhythm and  normal heart sounds.  Exam reveals no gallop and no friction rub.   No murmur heard. Trace edema bilat  Pulmonary/Chest: Effort normal and breath sounds normal. No respiratory distress.  Abdominal: Soft. Bowel sounds are normal. She exhibits no distension. There is no tenderness.  Musculoskeletal: Normal range of motion.  Lymphadenopathy:    She has no cervical adenopathy.  Neurological: She is alert.  Skin: Skin is warm and dry. She is not diaphoretic.  Dryness to hands, face  Psychiatric: Mood and affect normal.  Nursing note and vitals reviewed.   Labs reviewed/Significant Diagnostic Results:  Basic Metabolic Panel:  Recent Labs  10/08/14 04/06/15  NA 139 140  K 4.1 4.2  BUN 31* 27*  CREATININE 0.9 1.0   Liver Function Tests: No results for input(s): AST, ALT, ALKPHOS, BILITOT, PROT, ALBUMIN in the last 8760 hours. No results for input(s): LIPASE, AMYLASE in the last 8760 hours. No results for input(s): AMMONIA in the last 8760 hours. CBC:  Recent Labs  10/08/14 12/24/14  WBC 4.6 4.5  HGB 12.9 14.5  HCT 38 43  PLT 243 236   CBG: No results for input(s): GLUCAP in the last 8760 hours. TSH:  Recent Labs  04/06/15  TSH 1.27   A1C: No results found for: HGBA1C Lipid Panel: No results for input(s): CHOL, HDL, LDLCALC, TRIG, CHOLHDL, LDLDIRECT in the last 8760 hours. Functional Status Survey:     Assessment/Plan  1. Anxiety state -Controlled. Stable. -Continue Ativan .5 mg tid, Cymbalta, and prn Ativan.       2. Arthralgia of shoulder, unspecified laterality -Controlled with Tylenol.   3. Memory loss or impairment -Stable -no behavioral issues, would not start meds unless this occurs -Next MMSE 11/2015  4. Essential hypertension -Stable. Controlled -Continue Toprol XL.  -continue compression stockings  5. Insomnia -Controlled with melatonin.   Laurin Coder, DNP student  Cindi Carbon, ANP [facility] [phone]

## 2015-08-02 ENCOUNTER — Non-Acute Institutional Stay (SKILLED_NURSING_FACILITY): Payer: Medicare Other | Admitting: Adult Health

## 2015-08-02 ENCOUNTER — Encounter: Payer: Self-pay | Admitting: Adult Health

## 2015-08-02 DIAGNOSIS — I10 Essential (primary) hypertension: Secondary | ICD-10-CM

## 2015-08-02 DIAGNOSIS — J3089 Other allergic rhinitis: Secondary | ICD-10-CM | POA: Diagnosis not present

## 2015-08-02 DIAGNOSIS — F411 Generalized anxiety disorder: Secondary | ICD-10-CM | POA: Diagnosis not present

## 2015-08-02 DIAGNOSIS — M170 Bilateral primary osteoarthritis of knee: Secondary | ICD-10-CM

## 2015-08-02 NOTE — Progress Notes (Signed)
Patient ID: [name], female   DOB: 1916-12-18, 82 y.o.   MRN: [id number]  Location:  [place]:  SNF (31) Provider:   Cindi Carbon, ANP [place] [phone]   REED, Jonelle Sidle, DO  Patient Care Team: Gayland Curry, DO as PCP - General (Geriatric Medicine) [facility] [place] Campbell, MD as Consulting Physician (Gastroenterology) Melina Schools, MD as Consulting Physician (Orthopedic Surgery)  Extended Emergency Contact Information Primary Emergency Contact: [facility] Phone: [phone] Relation: None Secondary Emergency Contact: [facility] Address: [address]          [place] Home Phone: [phone] Relation: None  Code Status:  DNR Goals of care: Advanced Directive information Advanced Directives 03/09/2015  Does patient have an advance directive? Yes  Type of Paramedic of [place];Out of facility DNR (pink MOST or yellow form)  Copy of advanced directive(s) in chart? Yes  Pre-existing out of facility DNR order (yellow form or pink MOST form) Yellow form placed in chart (order not valid for inpatient use)     Chief Complaint  Patient presents with  . Medical Management of Chronic Issues    HPI:  Pt is a 82 y.o. female seen today for medical management of chronic diseases.   1. Essential hypertension Her BP is controlled. She is taking Toprol XL, Lasix, and K-Dur for management of her BP.   2. Other allergic rhinitis She denies cough, sneezing or congestion. Currently on atrovent and claritin.  3. Anxiety state She is taking scheduled Ativan for anxiety and has not needed any prn Ativan. She also takes Cymbalta.  4. Primary osteoarthritis of both knees She reports that she gets pain in her R shoulder and knees. States pain to shoulder is increased with getting dressed and using her R arm. Her pain is managed by Tylenol and  prn Ultram. She has taken one Ultram in the past 2 weeks.    Past Medical History  Diagnosis Date  . Allergic rhinitis 07/23/2012  . Unspecified malignant neoplasm of skin of other and unspecified parts of face 2012  . Malignant neoplasm of breast (female), unspecified site 27  . Neoplasm of unspecified nature of breast 2000  . Vitamin D deficiency 2009  . Other and unspecified hyperlipidemia 2005  . Anemia, unspecified 2005  . Anxiety state, unspecified 2005  . Depressive disorder, not elsewhere classified 2005  . Senile cataract, unspecified 2005  . Unspecified essential hypertension 2005  . Subdural hemorrhage ([place]) 2007  . External hemorrhoids without mention of complication 0000000  . Unspecified venous (peripheral) insufficiency 2001  . Disturbance of salivary secretion 2007  . GERD (gastroesophageal reflux disease) 2005  . Hypertonicity of bladder 2006  . Urinary tract infection, site not specified 2013  . Other seborrheic dermatitis 2007  . Osteoarthrosis, unspecified whether generalized or localized, unspecified site 2005  . Pain in joint, shoulder region 2008  . Senile osteoporosis 2000  . Insomnia, unspecified 2005  . Abnormality of gait 2007  . Unspecified urinary incontinence 2005  . Vertebral fracture, closed 2006  . Closed fracture of sternum 2006  . Closed fracture of two ribs 2013    left 5,6th  . Pain in joint, shoulder region 10/21/2012  . Basal cell carcinoma, face 03/20/2013  . Dysphagia     with chronic aspiration  . Hyperglycemia   . Depression   . Depression with anxiety   . Hyponatremia  Past Surgical History  Procedure Laterality Date  . Joint replacement Left     Knee  . Abdominal hysterectomy    . Breast surgery Right     Mastectomy  . Appendectomy    . Mohs surgery  2010    Nose  . Cyst excision Left 2011    Calcified cyst L shin  . Esophagogastroduodenoscopy endoscopy  07/11/2013    Dr. Watt Climes hiatus hernia  . Intramedullary (im) nail  intertrochanteric Right 02/15/2014    Procedure: INTRAMEDULLARY (IM) NAIL INTERTROCHANTRIC;  Surgeon: Melina Schools, MD;  Location: [facility] ORS;  Service: Orthopedics;  Laterality: Right;    Allergies  Allergen Reactions  . Other     "Epidural medication" per NH record  . Sulfa Antibiotics       Medication List       This list is accurate as of: 08/02/15  4:45 PM.  Always use your most recent med list.               acetaminophen 325 MG tablet  Commonly known as:  TYLENOL  Take 650 mg by mouth 2 (two) times daily. May have additional 650mg  daily as needed for pain relief     DULoxetine 60 MG capsule  Commonly known as:  CYMBALTA  Take 60 mg by mouth daily.     furosemide 20 MG tablet  Commonly known as:  LASIX  Take 20 mg by mouth daily.     hydrocortisone cream 1 %  Apply 1 application topically. Apply as needed for itching     ipratropium 0.06 % nasal spray  Commonly known as:  ATROVENT  Place 2 sprays into both nostrils 3 (three) times daily.     ipratropium-albuterol 0.5-2.5 (3) MG/3ML Soln  Commonly known as:  DUONEB  Take 3 mLs by nebulization every 6 (six) hours as needed (wheezing).     loratadine 10 MG tablet  Commonly known as:  CLARITIN  Take 10 mg by mouth daily.     LORazepam 0.5 MG tablet  Commonly known as:  ATIVAN  Take one tablet by mouth three times daily     Melatonin 5 MG Tabs  Take 1 tablet by mouth at bedtime. For insomnia     metoprolol succinate 25 MG 24 hr tablet  Commonly known as:  TOPROL-XL  Take 12.5 mg by mouth at bedtime. For HTN     omeprazole 20 MG capsule  Commonly known as:  PRILOSEC  Take 20 mg by mouth 2 (two) times daily before a meal.     OVER THE COUNTER MEDICATION  Apply 1 application topically 2 (two) times daily. Aveeno moisturizing - To lower legs/feet     polyethylene glycol packet  Commonly known as:  MIRALAX / GLYCOLAX  Take 17 g by mouth daily.     potassium chloride 10 MEQ tablet  Commonly known as:   K-DUR,KLOR-CON  Take 10 mEq by mouth daily.     sennosides-docusate sodium 8.6-50 MG tablet  Commonly known as:  SENOKOT-S  Take 2 tablets by mouth at bedtime. For constipation     traMADol 50 MG tablet  Commonly known as:  ULTRAM  Take 50 mg by mouth 3 (three) times daily as needed.     trolamine salicylate 10 % cream  Commonly known as:  ASPERCREME  Apply 1 application topically 3 (three) times daily. Apply to both knees        Review of Systems  Constitutional: Negative for fever, chills, activity change,  appetite change and fatigue.  Respiratory: Negative for apnea, cough, choking and shortness of breath.   Cardiovascular: Negative for chest pain and leg swelling.  Gastrointestinal: Negative for abdominal pain and constipation (Last BM today).  Genitourinary: Negative for dysuria.  Musculoskeletal: Positive for arthralgias. Negative for neck pain and neck stiffness.  Neurological: Negative for dizziness, weakness and headaches.  Psychiatric/Behavioral: Positive for confusion. Negative for suicidal ideas, hallucinations, behavioral problems, sleep disturbance, self-injury, dysphoric mood and agitation. The patient is nervous/anxious. The patient is not hyperactive.     Immunization History  Administered Date(s) Administered  . Influenza Whole 01/24/2012, 01/29/2013  . Influenza-Unspecified 01/20/2014, 02/05/2015  . PPD Test 10/10/2011  . Pneumococcal Polysaccharide-23 02/10/2004   Pertinent  Health Maintenance Due  Topic Date Due  . DEXA SCAN  01/28/1984  . PNA vac Low Risk Adult (2 of 2 - PCV13) 02/09/2005  . INFLUENZA VACCINE  11/16/2015   No flowsheet data found. Functional Status Survey:   Wt Readings from Last 3 Encounters:  08/02/15 173 lb 6.4 oz (78.654 kg)  06/28/15 172 lb 11.2 oz (78.336 kg)  05/20/15 171 lb 6.4 oz (77.747 kg)    Filed Vitals:   08/02/15 1641  Weight: 173 lb 6.4 oz (78.654 kg)   Body mass index is 33.86 kg/(m^2). Physical Exam    Constitutional: She appears well-developed and well-nourished. No distress.  HENT:  Head: Normocephalic and atraumatic.  Mouth/Throat: Oropharynx is clear and moist.  Neck: No JVD present.  Cardiovascular: Normal rate, regular rhythm and normal heart sounds.   Pulmonary/Chest: Effort normal and breath sounds normal. No respiratory distress. She has no wheezes.  Abdominal: Soft. Bowel sounds are normal. She exhibits no mass. There is no tenderness.  Musculoskeletal: She exhibits tenderness (R shoulder ). She exhibits no edema.  Lymphadenopathy:    She has no cervical adenopathy.  Neurological: She is alert.  Skin: Skin is warm and dry. No rash noted. She is not diaphoretic.  Psychiatric: She has a normal mood and affect.  Nursing note and vitals reviewed.   Labs reviewed:  Recent Labs  10/08/14 04/06/15  NA 139 140  K 4.1 4.2  BUN 31* 27*  CREATININE 0.9 1.0   No results for input(s): AST, ALT, ALKPHOS, BILITOT, PROT, ALBUMIN in the last 8760 hours.  Recent Labs  10/08/14 12/24/14  WBC 4.6 4.5  HGB 12.9 14.5  HCT 38 43  PLT 243 236   Lab Results  Component Value Date   TSH 1.27 04/06/2015   No results found for: HGBA1C Lab Results  Component Value Date   CHOL 171 08/07/2013   HDL 42 08/07/2013   LDLCALC 109 08/07/2013   TRIG 102 08/07/2013    Significant Diagnostic Results in last 30 days:  No results found.  Assessment/Plan 1. Essential hypertension Controlled. Continue Toprol XL, Lasix and K-Dur. Continue to monitor weight  2. Other allergic rhinitis Improved. Continue atrovent and claritin Will continue to monitor.   3. Anxiety state Stable. Continue scheduled and prn Ativan. Continue Cymbalta.   4. Primary osteoarthritis of both knees Will continue to monitor.  Continue Tylenol and prn Ultram.     Labs/tests ordered:  NA  Cindi Carbon, ANP [facility] [phone]

## 2015-08-03 ENCOUNTER — Encounter: Payer: Self-pay | Admitting: Internal Medicine

## 2015-08-03 ENCOUNTER — Non-Acute Institutional Stay (SKILLED_NURSING_FACILITY): Payer: Medicare Other | Admitting: Internal Medicine

## 2015-08-03 DIAGNOSIS — R413 Other amnesia: Secondary | ICD-10-CM

## 2015-08-03 DIAGNOSIS — K5901 Slow transit constipation: Secondary | ICD-10-CM | POA: Diagnosis not present

## 2015-08-03 DIAGNOSIS — N318 Other neuromuscular dysfunction of bladder: Secondary | ICD-10-CM | POA: Diagnosis not present

## 2015-08-03 DIAGNOSIS — R269 Unspecified abnormalities of gait and mobility: Secondary | ICD-10-CM

## 2015-08-03 DIAGNOSIS — I10 Essential (primary) hypertension: Secondary | ICD-10-CM

## 2015-08-03 DIAGNOSIS — F411 Generalized anxiety disorder: Secondary | ICD-10-CM

## 2015-08-03 DIAGNOSIS — M7581 Other shoulder lesions, right shoulder: Secondary | ICD-10-CM | POA: Diagnosis not present

## 2015-08-03 DIAGNOSIS — K224 Dyskinesia of esophagus: Secondary | ICD-10-CM

## 2015-08-03 NOTE — Progress Notes (Signed)
Patient ID: [name], female   DOB: 1916/10/18, 82 y.o.   MRN: [id number]  Location:  [place] Room Number: 117A Place of Service:  SNF (31) Provider:  Ayrabella Labombard L. Mariea Clonts, D.O., C.M.D.  Hollace Kinnier, DO  Patient Care Team: Gayland Curry, DO as PCP - General (Geriatric Medicine) [facility] Richmond Campbell, MD as Consulting Physician (Gastroenterology) Melina Schools, MD as Consulting Physician (Orthopedic Surgery)  Extended Emergency Contact Information Primary Emergency Contact: [facility] Phone: [phone] Relation: None Secondary Emergency Contact: [facility] Address: [address]          [place]  [place] Home Phone: [phone] Relation: None  Code Status:  DNR Goals of care: Advanced Directive information Advanced Directives 08/03/2015  Does patient have an advance directive? Yes  Type of Advance Directive Out of facility DNR (pink MOST or yellow form)  Copy of advanced directive(s) in chart? Yes  Pre-existing out of facility DNR order (yellow form or pink MOST form) Yellow form placed in chart (order not valid for inpatient use)   Chief Complaint  Patient presents with  . Medical Management of Chronic Issues    routine visit    HPI:  Pt is a 82 y.o. female seen today for medical management of chronic diseases.    When seen, [name] c/o right shoulder pain.  She has decreased ROM of the right shoulder.  She has chronic OA of bilateral knees and uses aspercreme for that and tramadol when it's bad.  She uses her tylenol more often and prefers it due to constipation from the tramadol.  This is controlled with some senna and miralax.    Seems her cough and sputum production are under better control than prior visits.    She reports attending most activities "for [place] old people" at her retirement community.  She admits to some loneliness, but denies depression.   She requires help with  all adls except feeding herself.    Past Medical History  Diagnosis Date  . Allergic rhinitis 07/23/2012  . Unspecified malignant neoplasm of skin of other and unspecified parts of face 2012  . Malignant neoplasm of breast (female), unspecified site 44  . Neoplasm of unspecified nature of breast 2000  . Vitamin D deficiency 2009  . Other and unspecified hyperlipidemia 2005  . Anemia, unspecified 2005  . Anxiety state, unspecified 2005  . Depressive disorder, not elsewhere classified 2005  . Senile cataract, unspecified 2005  . Unspecified essential hypertension 2005  . Subdural hemorrhage ([place]) 2007  . External hemorrhoids without mention of complication 0000000  . Unspecified venous (peripheral) insufficiency 2001  . Disturbance of salivary secretion 2007  . GERD (gastroesophageal reflux disease) 2005  . Hypertonicity of bladder 2006  . Urinary tract infection, site not specified 2013  . Other seborrheic dermatitis 2007  . Osteoarthrosis, unspecified whether generalized or localized, unspecified site 2005  . Pain in joint, shoulder region 2008  . Senile osteoporosis 2000  . Insomnia, unspecified 2005  . Abnormality of gait 2007  . Unspecified urinary incontinence 2005  . Vertebral fracture, closed 2006  . Closed fracture of sternum 2006  . Closed fracture of two ribs 2013    left 5,6th  . Pain in joint, shoulder region 10/21/2012  . Basal cell carcinoma, face 03/20/2013  . Dysphagia     with chronic aspiration  . Hyperglycemia   . Depression   . Depression with anxiety   . Hyponatremia  Past Surgical History  Procedure Laterality Date  . Joint replacement Left     Knee  . Abdominal hysterectomy    . Breast surgery Right     Mastectomy  . Appendectomy    . Mohs surgery  2010    Nose  . Cyst excision Left 2011    Calcified cyst L shin  . Esophagogastroduodenoscopy endoscopy  07/11/2013    Dr. Watt Climes hiatus hernia  . Intramedullary (im) nail intertrochanteric Right  02/15/2014    Procedure: INTRAMEDULLARY (IM) NAIL INTERTROCHANTRIC;  Surgeon: Melina Schools, MD;  Location: [facility];  Service: Orthopedics;  Laterality: Right;    Allergies  Allergen Reactions  . Other     "Epidural medication" per NH record  . Sulfa Antibiotics       Medication List       This list is accurate as of: 08/03/15 11:59 PM.  Always use your most recent med list.               acetaminophen 325 MG tablet  Commonly known as:  TYLENOL  Take 650 mg by mouth 2 (two) times daily. May have additional 650mg  daily as needed for pain relief     DULoxetine 60 MG capsule  Commonly known as:  CYMBALTA  Take 60 mg by mouth daily.     furosemide 20 MG tablet  Commonly known as:  LASIX  Take 20 mg by mouth daily.     hydrocortisone cream 1 %  Apply 1 application topically. Apply as needed for itching     ipratropium 0.06 % nasal spray  Commonly known as:  ATROVENT  Place 2 sprays into both nostrils 3 (three) times daily.     ipratropium-albuterol 0.5-2.5 (3) MG/3ML Soln  Commonly known as:  DUONEB  Take 3 mLs by nebulization every 6 (six) hours as needed (wheezing).     loratadine 10 MG tablet  Commonly known as:  CLARITIN  Take 10 mg by mouth daily.     LORazepam 0.5 MG tablet  Commonly known as:  ATIVAN  Take one tablet by mouth three times daily     Melatonin 5 MG Tabs  Take 1 tablet by mouth at bedtime. For insomnia     metoprolol succinate 25 MG 24 hr tablet  Commonly known as:  TOPROL-XL  Take 12.5 mg by mouth at bedtime. For HTN     omeprazole 20 MG capsule  Commonly known as:  PRILOSEC  Take 20 mg by mouth 2 (two) times daily before a meal.     polyethylene glycol packet  Commonly known as:  MIRALAX / GLYCOLAX  Take 17 g by mouth daily.     potassium chloride 10 MEQ tablet  Commonly known as:  K-DUR,KLOR-CON  Take 10 mEq by mouth daily.     sennosides-docusate sodium 8.6-50 MG tablet  Commonly known as:  SENOKOT-S  Take 2 tablets by mouth at  bedtime. For constipation     SODIUM FLUORIDE (DENTAL RINSE) 0.02 % Soln  Use as directed 15 mLs in the mouth or throat 4 (four) times daily -  before meals and at bedtime.     traMADol 50 MG tablet  Commonly known as:  ULTRAM  Take 50 mg by mouth 3 (three) times daily as needed.     trolamine salicylate 10 % cream  Commonly known as:  ASPERCREME  Apply 1 application topically 3 (three) times daily. Apply to both knees        Review of  Systems  Constitutional: Negative for fever, activity change and appetite change.  HENT: Positive for congestion and hearing loss.   Eyes: Negative for pain.       Glasses  Respiratory: Positive for cough. Negative for chest tightness, shortness of breath and wheezing.   Cardiovascular: Negative for chest pain, palpitations and leg swelling.  Gastrointestinal: Negative for abdominal pain.  Genitourinary: Negative for dysuria.  Musculoskeletal: Positive for arthralgias and gait problem.       Knees and right shoulder  Neurological: Positive for weakness. Negative for dizziness, light-headedness and headaches.  Psychiatric/Behavioral: Positive for confusion and sleep disturbance. Negative for agitation. The patient is nervous/anxious.     Immunization History  Administered Date(s) Administered  . Influenza Whole 01/24/2012, 01/29/2013  . Influenza-Unspecified 01/20/2014, 02/05/2015  . PPD Test 10/10/2011  . Pneumococcal Polysaccharide-23 02/10/2004   Pertinent  Health Maintenance Due  Topic Date Due  . DEXA SCAN  01/28/1984  . PNA vac Low Risk Adult (2 of 2 - PCV13) 02/09/2005  . INFLUENZA VACCINE  11/16/2015   No flowsheet data found. Functional Status Survey: Is the patient deaf or have difficulty hearing?: Yes Does the patient have difficulty seeing, even when wearing glasses/contacts?: No Does the patient have difficulty concentrating, remembering, or making decisions?: Yes Does the patient have difficulty walking or climbing stairs?:  Yes Does the patient have difficulty dressing or bathing?: Yes Does the patient have difficulty doing errands alone such as visiting a doctor's office or shopping?: Yes  Filed Vitals:   08/03/15 1444  BP: 140/80  Pulse: 72  Temp: 98.2 F (36.8 C)  TempSrc: Oral  Resp: 18  Weight: 173 lb (78.472 kg)  SpO2: 98%   Body mass index is 33.79 kg/(m^2). Physical Exam  Constitutional: She appears well-developed and well-nourished. No distress.  Cardiovascular: Normal rate, regular rhythm, normal heart sounds and intact distal pulses.   Pulmonary/Chest: Effort normal and breath sounds normal.  Abdominal: Soft. Bowel sounds are normal.  Musculoskeletal: She exhibits tenderness.  Right rotator cuff insertion; pain worse with abduction and internal rotation in attempts to do her hair or fasten her bra  Neurological: She is alert.  Skin: Skin is warm and dry.  Psychiatric: She has a normal mood and affect.    Labs reviewed:  Recent Labs  10/08/14 04/06/15  NA 139 140  K 4.1 4.2  BUN 31* 27*  CREATININE 0.9 1.0   No results for input(s): AST, ALT, ALKPHOS, BILITOT, PROT, ALBUMIN in the last 8760 hours.  Recent Labs  10/08/14 12/24/14  WBC 4.6 4.5  HGB 12.9 14.5  HCT 38 43  PLT 243 236   Lab Results  Component Value Date   TSH 1.27 04/06/2015   No results found for: HGBA1C Lab Results  Component Value Date   CHOL 171 08/07/2013   HDL 42 08/07/2013   LDLCALC 109 08/07/2013   TRIG 102 08/07/2013    Assessment/Plan 1. Right rotator cuff tendonitis -has not had recent therapy or treatment for this -wrote orders to use the aspercreme on her right shoulder also like her knees -if she continues to c/o this, could use OT for some therapeutic modalities and exercises  2. Esophageal dysmotility -ongoing, continue current interventions, seems she is doing a bit better with her coughing and spitting up than she had been  3. Essential hypertension -bp right at upper limits,  but reasonable for a 82 yo wheelchair bound female with cognitive impairment that is functionally dependent in SNF  4.  Sensorimotor gait disorder -uses wheelchair to get around, is active in therapeutic recreational groups  5. Anxiety state -remains on lorazepam for her anxiety and helps with sleep some also  6. Hypertonicity of bladder -does have some incontinence, likely mostly functional at this point  7. Slow transit constipation -cont senna and miralax to counteract immobility, decreased fluid intake and decreased motility of age, plus pain meds  8. Memory loss or impairment -poor short term memory, in SNF level of care, cont same and monitor  Family/ staff Communication: discussed with SNF nursing  Labs/tests ordered:  No new

## 2015-08-08 IMAGING — CT CT CERVICAL SPINE W/O CM
3 series · 13 of 20 positions shown, 15 images · non-contrast
Comparison: 12/22/2005

CLINICAL DATA: Fell from chair were without loss of consciousness,
neck pain

EXAM:
CT CERVICAL SPINE WITHOUT CONTRAST
TECHNIQUE: Multidetector CT imaging of the cervical spine was performed without
intravenous contrast. Multiplanar CT image reconstructions were also
generated.

[Series 4: c-spine st · axial · 0.26mm/px · z∈[+1056,+1148]mm · 5 of 70 slices shown]
[im 12/70  bone]
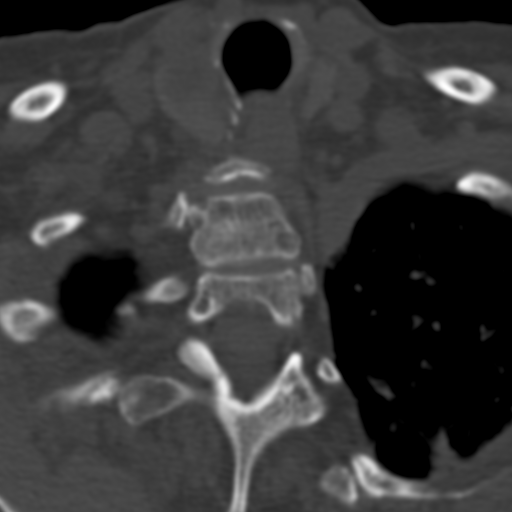
[im 24/70  bone]
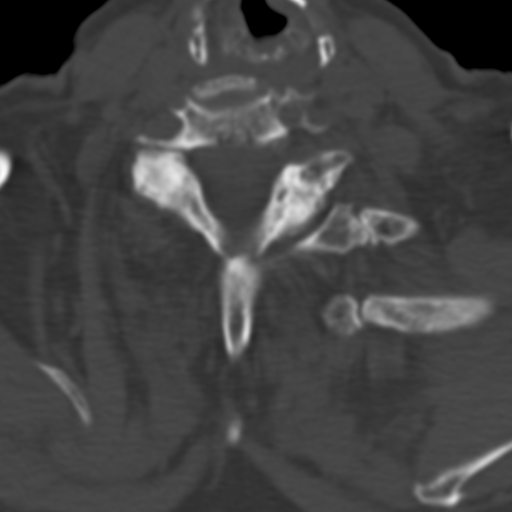
[im 35/70  bone]
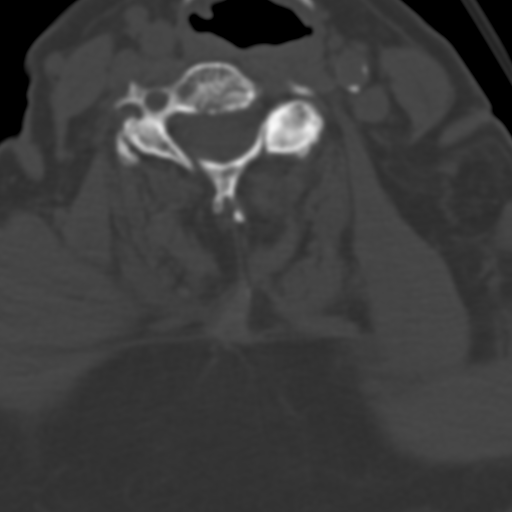
[im 47/70  bone]
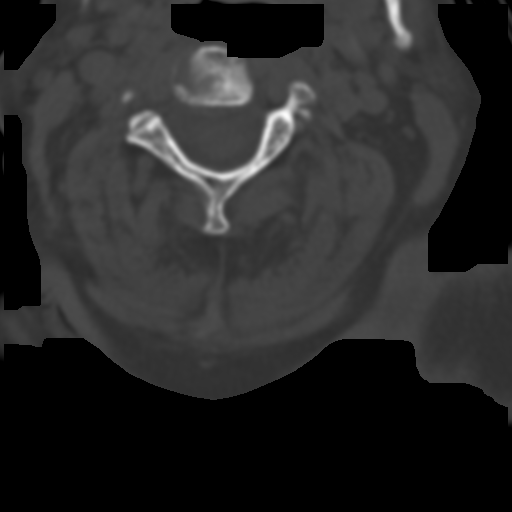
[im 58/70  bone]
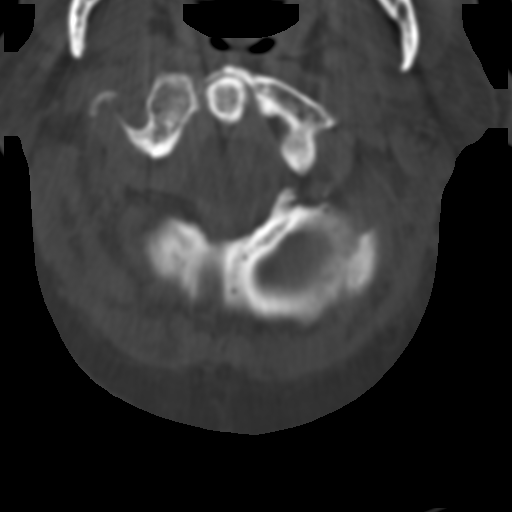

[Series 7: axial reformats · axial · 0.23mm/px · z∈[+1053,+1147]mm · 5 of 71 slices shown, 7 images (1 of 2)]
[im 12/71  soft-tissue]
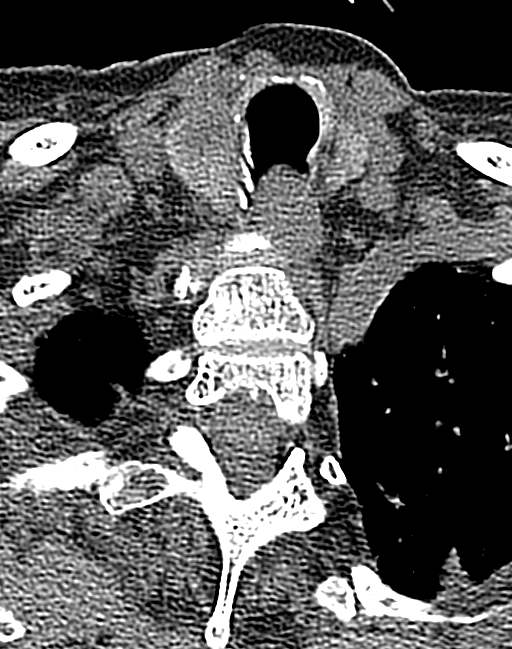
[im 12/71  bone]
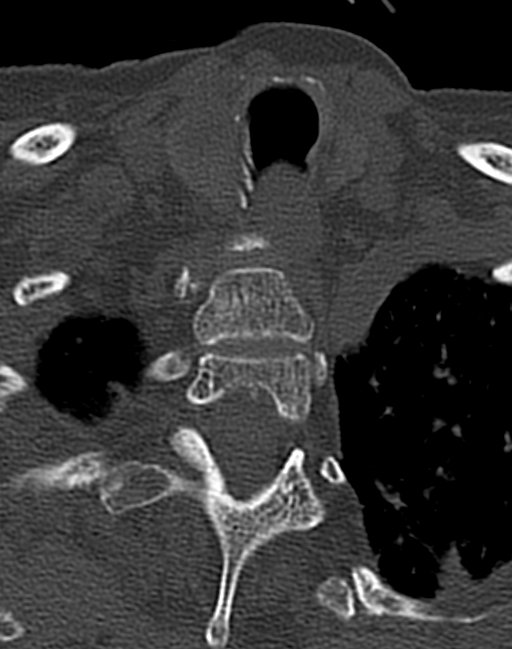
[im 24/71  bone]
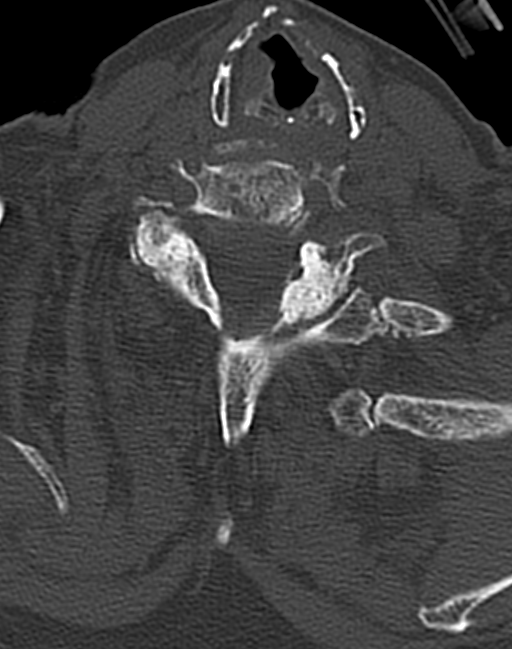
[im 36/71  bone]
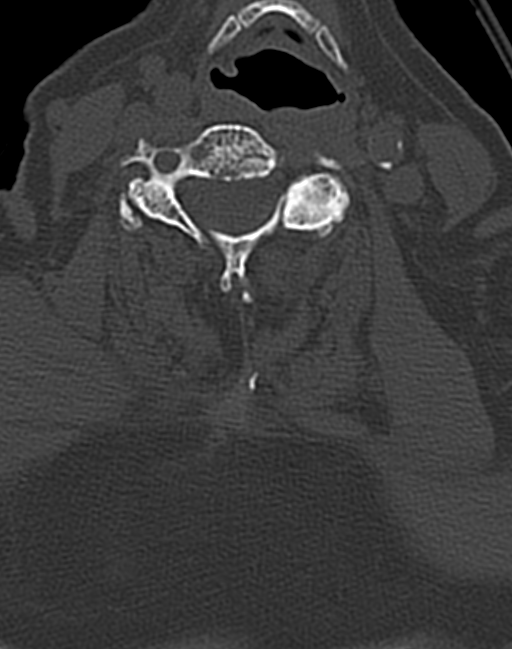
[im 47/71  bone]
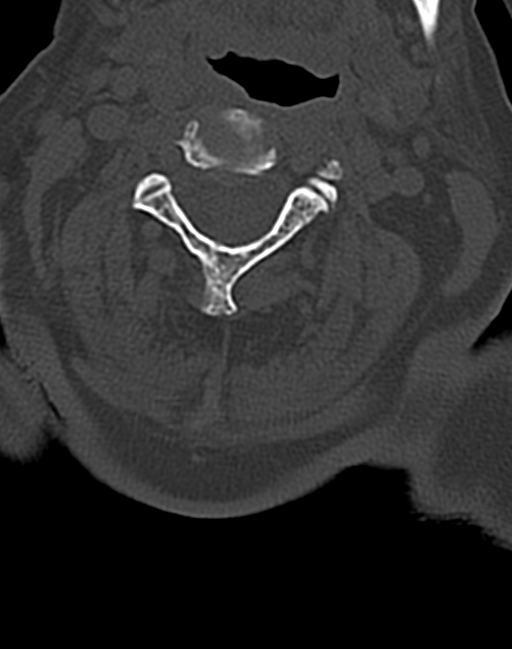
[im 59/71  soft-tissue]
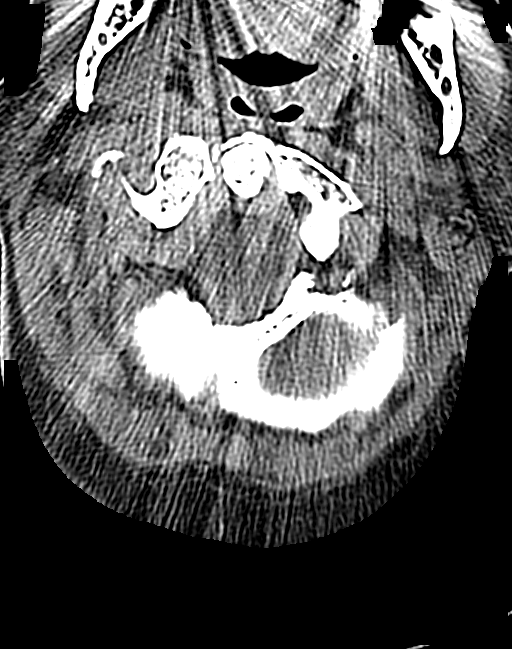
[im 59/71  bone]
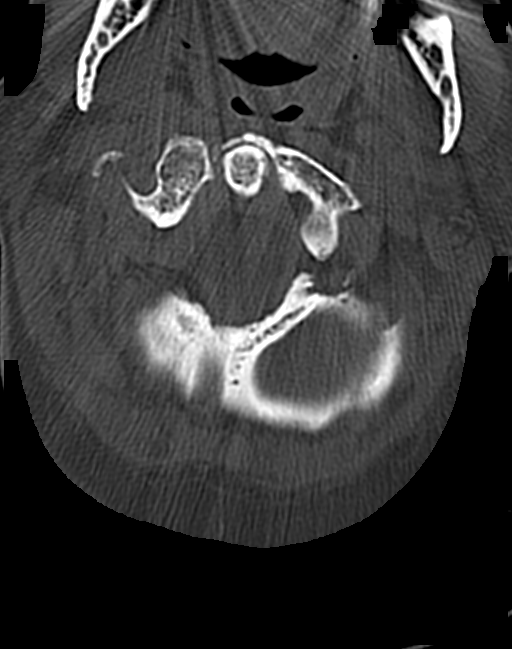

[Series 8: axial reformats · coronal · 0.23mm/px · 3 of 97 slices shown (2 of 2)]
[im 20/97  bone]
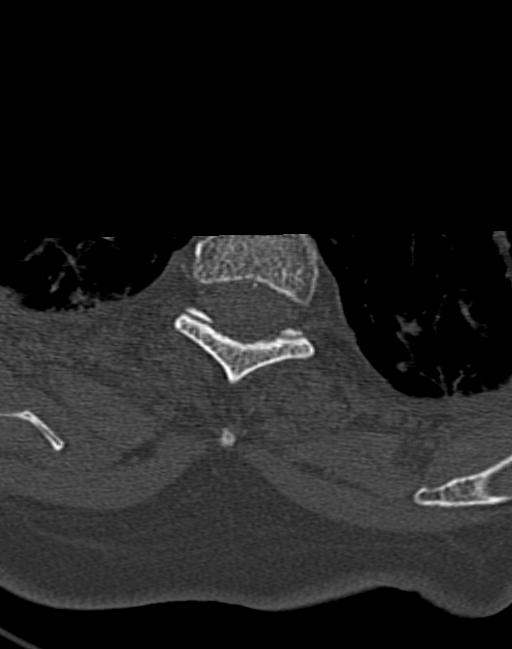
[im 39/97  bone]
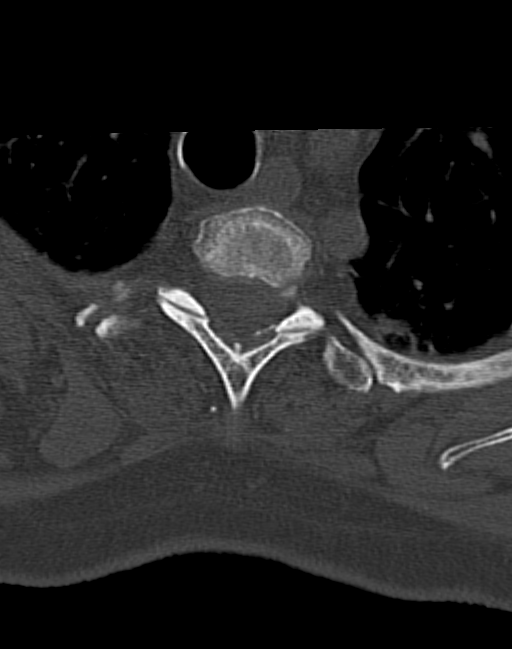
[im 58/97  bone]
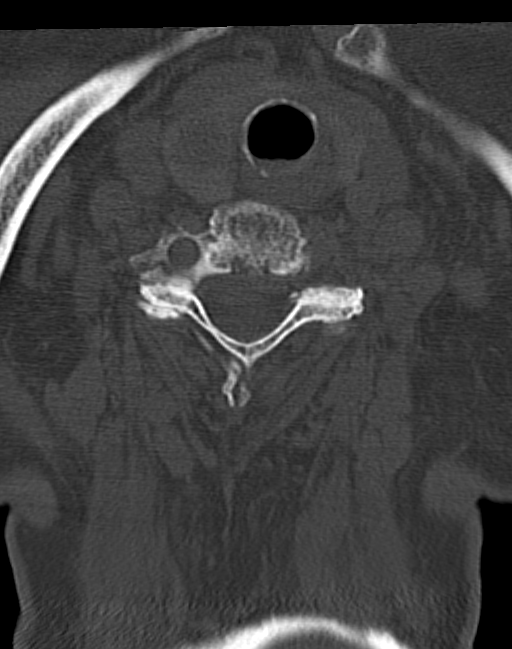

[13 of 20 positions shown; findings below may reference images not displayed]

FINDINGS: Seven cervical segments are well visualized. Apparent congenital
fusion of C3 and C4 is noted. Multilevel facet hypertrophic changes
are noted. No acute fracture or acute facet abnormality is seen.
Surrounding soft tissues are stable from the prior exam. A
hypodensity is again noted in the right lobe of the thyroid stable
from the previous exam. Given its stability it is likely benign in
etiology. The visualized lung apices are within normal limits with
some scarring in the right apex.
IMPRESSION: Multilevel degenerative change.

Partial fusion C3 and C4 likely of a congenital nature

No acute abnormality is noted.

## 2015-08-09 IMAGING — RF DG HIP COMPLETE 2+V*R*
1 series · 7 of 7 positions shown · non-contrast
Comparison: None.

CLINICAL DATA: Right hip fracture.  Subsequent encounter.

EXAM:
DG OPERATIVE right HIP 1-2 VIEWS
TECHNIQUE: Fluoroscopic spot image(s) were submitted for interpretation
post-operatively.

[Series 1: run · 7 of 7 slices shown]
[im 1/7]
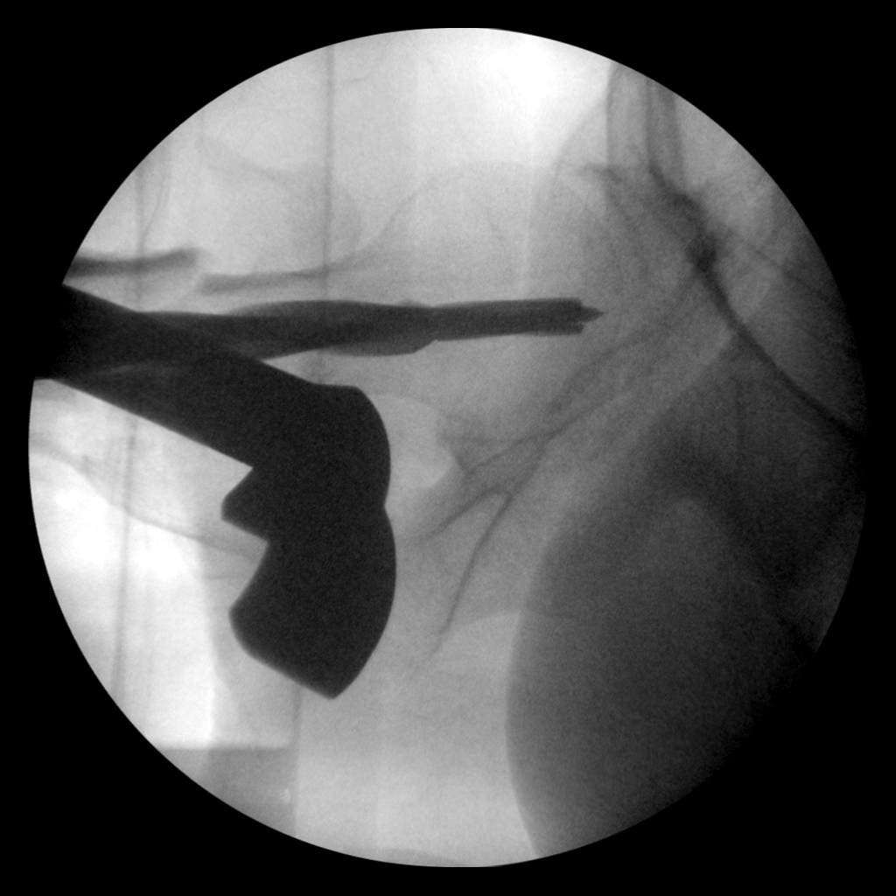
[im 2/7]
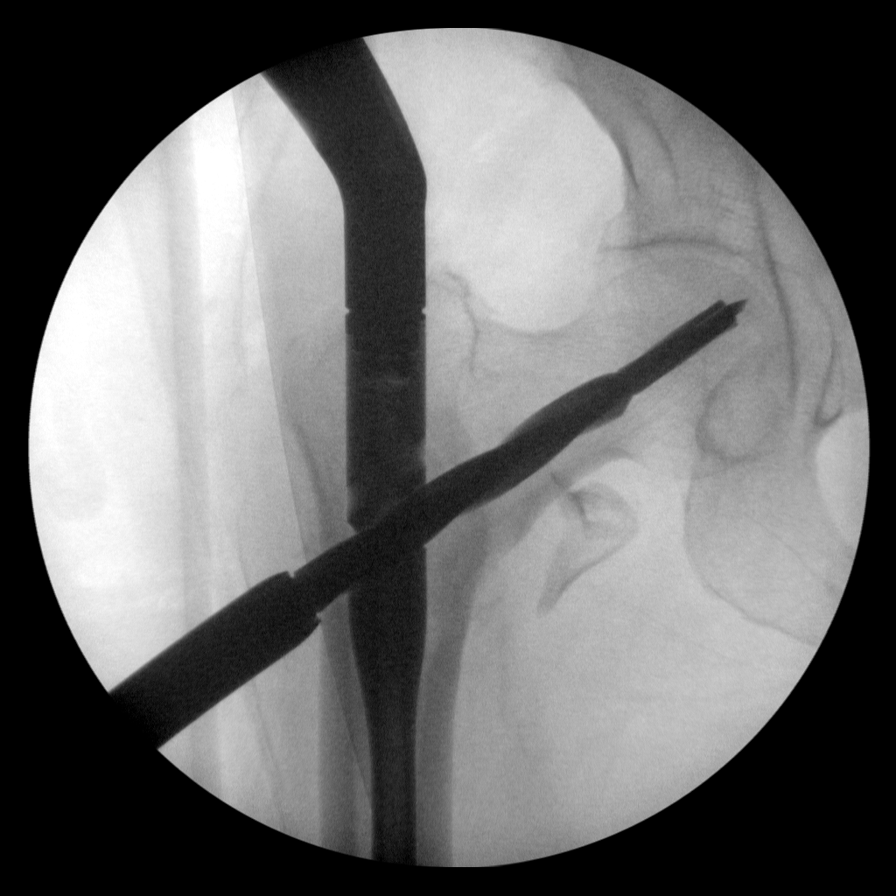
[im 3/7]
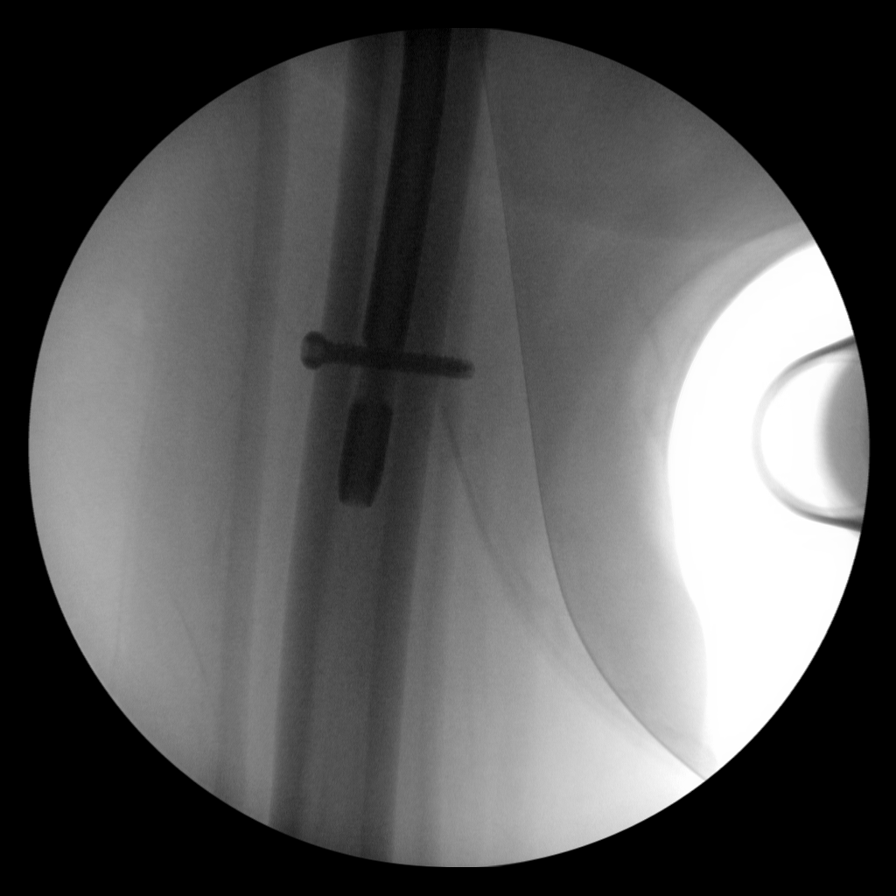
[im 4/7]
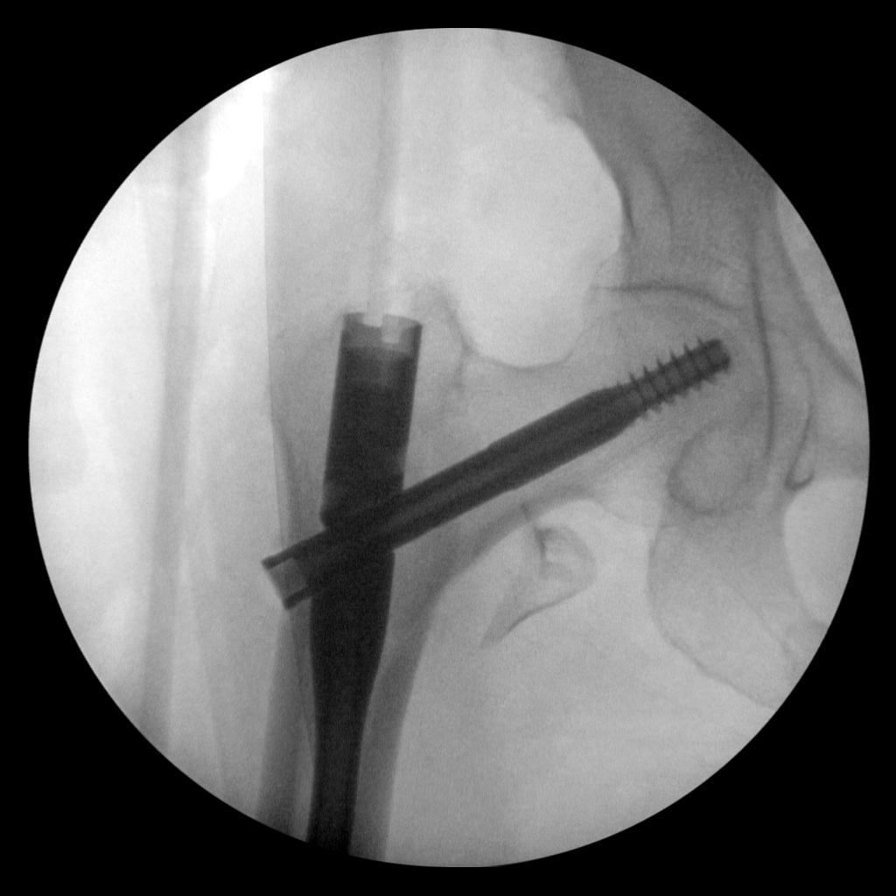
[im 5/7]
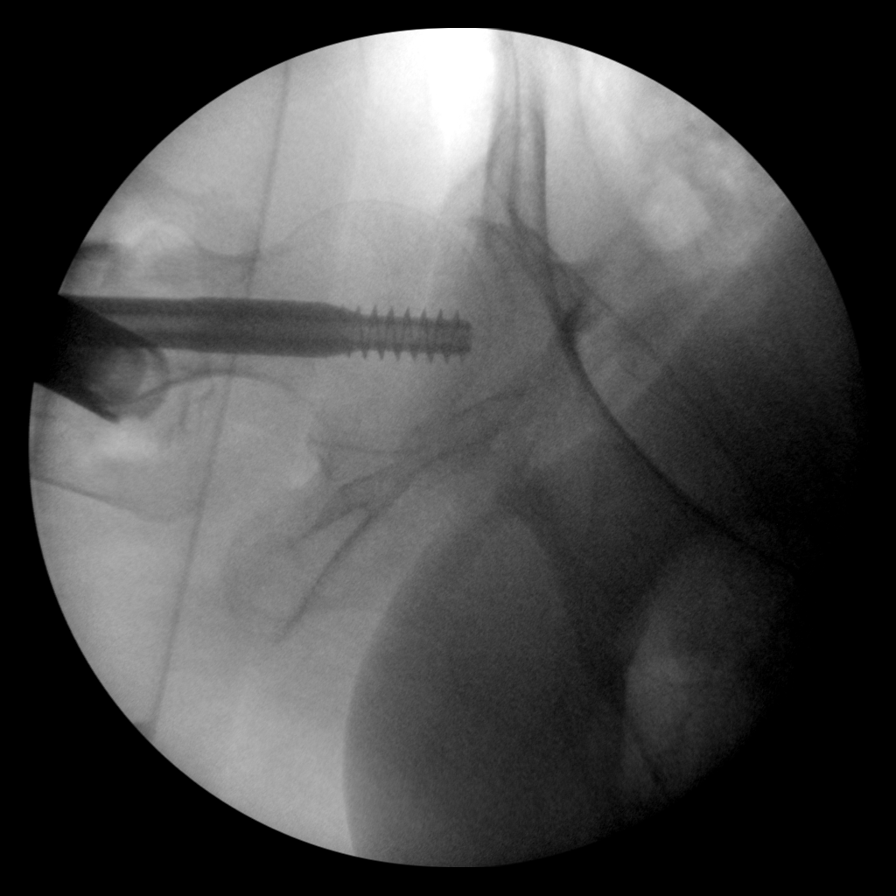
[im 6/7]
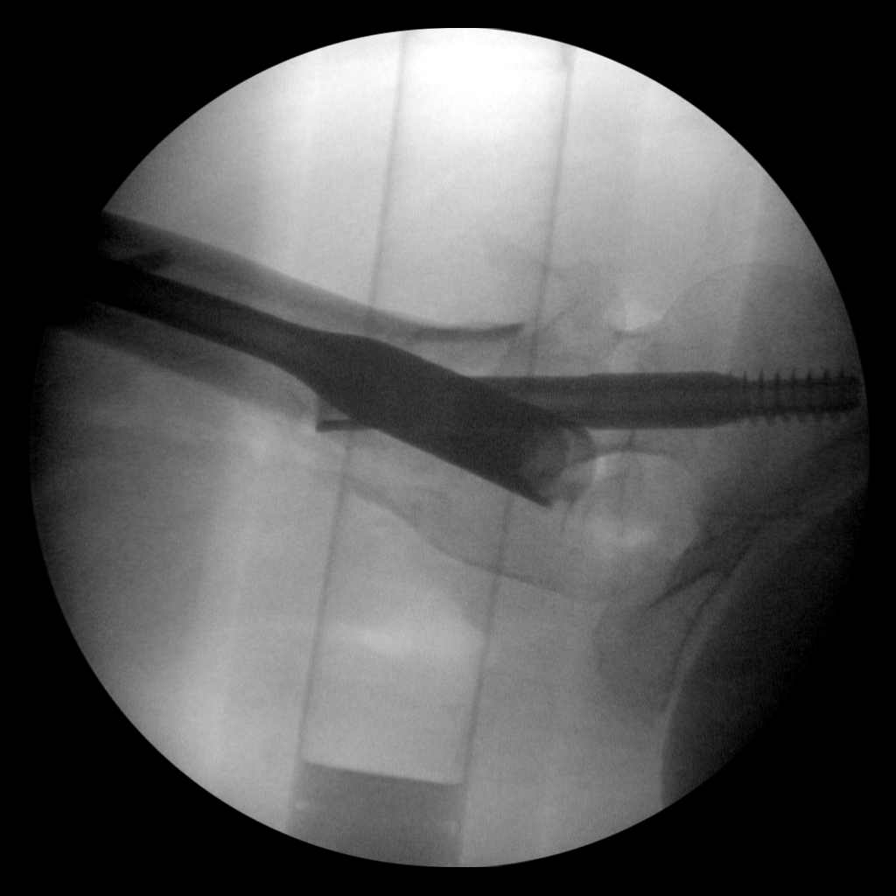
[im 7/7]
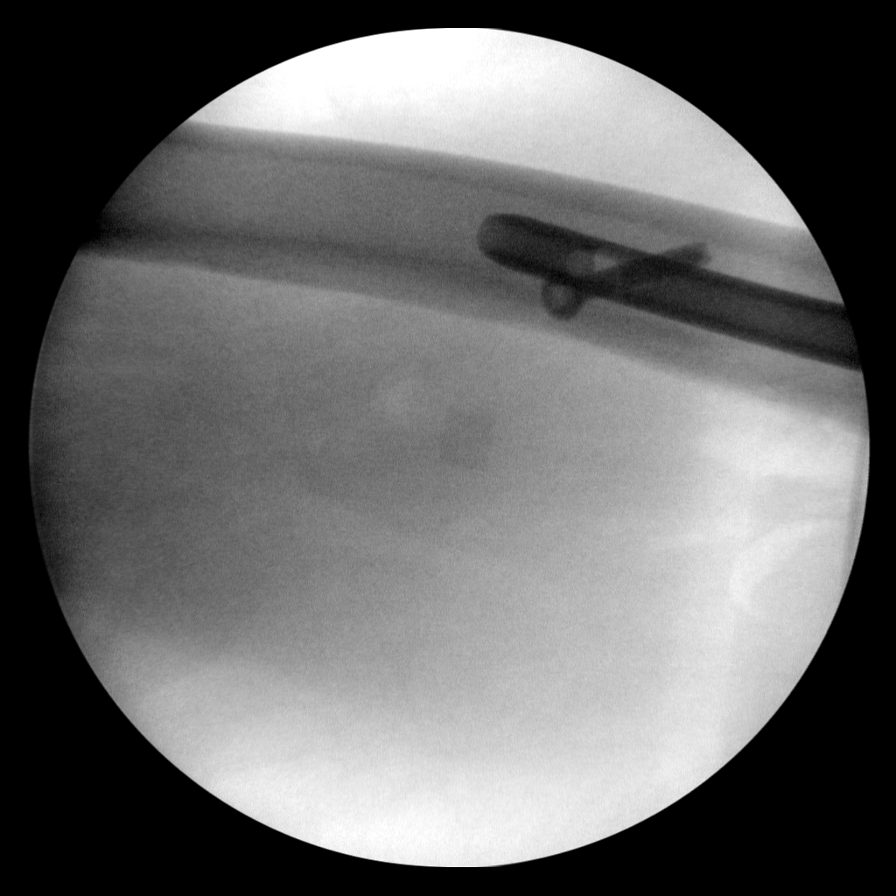

[7 of 7 positions shown; findings below may reference images not displayed]

FINDINGS: Multiple spot films show placement of a screw and intramedullary rod
transfixing the intertrochanteric right hip fracture in near
anatomic alignment. No other fracture or dislocation identified.
IMPRESSION: Internal fixation of right hip fracture in near anatomic alignment.

## 2015-09-06 ENCOUNTER — Non-Acute Institutional Stay (SKILLED_NURSING_FACILITY): Payer: Medicare Other | Admitting: Adult Health

## 2015-09-06 ENCOUNTER — Encounter: Payer: Self-pay | Admitting: Adult Health

## 2015-09-06 DIAGNOSIS — I10 Essential (primary) hypertension: Secondary | ICD-10-CM | POA: Diagnosis not present

## 2015-09-06 DIAGNOSIS — J3089 Other allergic rhinitis: Secondary | ICD-10-CM

## 2015-09-06 DIAGNOSIS — K224 Dyskinesia of esophagus: Secondary | ICD-10-CM

## 2015-09-06 DIAGNOSIS — R413 Other amnesia: Secondary | ICD-10-CM

## 2015-09-06 NOTE — Assessment & Plan Note (Signed)
Fairly stable over the past year. Not on meds due to lack of benefit

## 2015-09-06 NOTE — Assessment & Plan Note (Signed)
Stable. Continue claritin and atrovent.

## 2015-09-06 NOTE — Progress Notes (Signed)
Patient ID: [name], female   DOB: Aug 20, 1916, 82 y.o.   MRN: [id number]  Location:  [place]:  SNF (31) Provider:   Cindi Carbon, ANP [place] [phone]   REED, Jonelle Sidle, DO  Patient Care Team: Gayland Curry, DO as PCP - General (Geriatric Medicine) Well [facility] Richmond Campbell, MD as Consulting Physician (Gastroenterology) Melina Schools, MD as Consulting Physician (Orthopedic Surgery)  Extended Emergency Contact Information Primary Emergency Contact: [facility] Phone: [phone] Relation: None Secondary Emergency Contact: [facility] Address: [address]          [place]  [place] Home Phone: [phone] Relation: None  Code Status:  DNR Goals of care: Advanced Directive information Advanced Directives 08/03/2015  Does patient have an advance directive? Yes  Type of Advance Directive Out of facility DNR (pink MOST or yellow form)  Copy of advanced directive(s) in chart? Yes  Pre-existing out of facility DNR order (yellow form or pink MOST form) Yellow form placed in chart (order not valid for inpatient use)     Chief Complaint  Patient presents with  . Medical Management of Chronic Issues    HPI:  Pt is a 82 y.o. female seen today for medical management of chronic diseases.  She resides in skilled care due to memory loss and immobility. There are no complaints regarding her care. Her VS have been stable. Weight stable at 172 lbs. She has progressive dementia, last MMSE 20/30 on 12/16/14 with a failed clock. She has a hx of esophageal dysmotility but is on a regular diet (her family has signed a Chartered loss adjuster).  She has not had any issues with aspiration.  Staff provides oral care as a preventative measure. She has a chronic cough that has improved over time with prilosec and atrovent nasal spray.   Past Medical History  Diagnosis Date  . Allergic rhinitis 07/23/2012  .  Unspecified malignant neoplasm of skin of other and unspecified parts of face 2012  . Malignant neoplasm of breast (female), unspecified site 21  . Neoplasm of unspecified nature of breast 2000  . Vitamin D deficiency 2009  . Other and unspecified hyperlipidemia 2005  . Anemia, unspecified 2005  . Anxiety state, unspecified 2005  . Depressive disorder, not elsewhere classified 2005  . Senile cataract, unspecified 2005  . Unspecified essential hypertension 2005  . Subdural hemorrhage ([place]) 2007  . External hemorrhoids without mention of complication 0000000  . Unspecified venous (peripheral) insufficiency 2001  . Disturbance of salivary secretion 2007  . GERD (gastroesophageal reflux disease) 2005  . Hypertonicity of bladder 2006  . Urinary tract infection, site not specified 2013  . Other seborrheic dermatitis 2007  . Osteoarthrosis, unspecified whether generalized or localized, unspecified site 2005  . Pain in joint, shoulder region 2008  . Senile osteoporosis 2000  . Insomnia, unspecified 2005  . Abnormality of gait 2007  . Unspecified urinary incontinence 2005  . Vertebral fracture, closed 2006  . Closed fracture of sternum 2006  . Closed fracture of two ribs 2013    left 5,6th  . Pain in joint, shoulder region 10/21/2012  . Basal cell carcinoma, face 03/20/2013  . Dysphagia     with chronic aspiration  . Hyperglycemia   . Depression   . Depression with anxiety   . Hyponatremia    Past Surgical History  Procedure Laterality Date  . Joint replacement Left     Knee  . Abdominal hysterectomy    .  Breast surgery Right     Mastectomy  . Appendectomy    . Mohs surgery  2010    Nose  . Cyst excision Left 2011    Calcified cyst L shin  . Esophagogastroduodenoscopy endoscopy  07/11/2013    Dr. Watt Climes hiatus hernia  . Intramedullary (im) nail intertrochanteric Right 02/15/2014    Procedure: INTRAMEDULLARY (IM) NAIL INTERTROCHANTRIC;  Surgeon: Melina Schools, MD;  Location: [facility];  Service: Orthopedics;  Laterality: Right;    Allergies  Allergen Reactions  . Other     "Epidural medication" per NH record  . Sulfa Antibiotics       Medication List       This list is accurate as of: 09/06/15  3:16 PM.  Always use your most recent med list.               acetaminophen 325 MG tablet  Commonly known as:  TYLENOL  Take 650 mg by mouth 2 (two) times daily. May have additional 650mg  daily as needed for pain relief     DULoxetine 60 MG capsule  Commonly known as:  CYMBALTA  Take 60 mg by mouth daily.     furosemide 20 MG tablet  Commonly known as:  LASIX  Take 20 mg by mouth daily.     hydrocortisone cream 1 %  Apply 1 application topically. Apply as needed for itching     ipratropium 0.06 % nasal spray  Commonly known as:  ATROVENT  Place 2 sprays into both nostrils 3 (three) times daily.     ipratropium-albuterol 0.5-2.5 (3) MG/3ML Soln  Commonly known as:  DUONEB  Take 3 mLs by nebulization every 6 (six) hours as needed (wheezing).     loratadine 10 MG tablet  Commonly known as:  CLARITIN  Take 10 mg by mouth daily.     LORazepam 0.5 MG tablet  Commonly known as:  ATIVAN  Take one tablet by mouth three times daily     Melatonin 5 MG Tabs  Take 1 tablet by mouth at bedtime. For insomnia     metoprolol succinate 25 MG 24 hr tablet  Commonly known as:  TOPROL-XL  Take 12.5 mg by mouth at bedtime. For HTN     omeprazole 20 MG capsule  Commonly known as:  PRILOSEC  Take 20 mg by mouth 2 (two) times daily before a meal.     polyethylene glycol packet  Commonly known as:  MIRALAX / GLYCOLAX  Take 17 g by mouth daily.     potassium chloride 10 MEQ tablet  Commonly known as:  K-DUR,KLOR-CON  Take 10 mEq by mouth daily.     sennosides-docusate sodium 8.6-50 MG tablet  Commonly known as:  SENOKOT-S  Take 2 tablets by mouth at bedtime. For constipation     SODIUM FLUORIDE (DENTAL RINSE) 0.02 % Soln  Use as directed 15 mLs in the  mouth or throat 4 (four) times daily -  before meals and at bedtime.     traMADol 50 MG tablet  Commonly known as:  ULTRAM  Take 50 mg by mouth 3 (three) times daily as needed.     trolamine salicylate 10 % cream  Commonly known as:  ASPERCREME  Apply 1 application topically 3 (three) times daily. Apply to both knees        Review of Systems  Constitutional: Negative for fever, chills, activity change, appetite change and fatigue.  HENT: Negative for congestion.   Respiratory: Negative for apnea,  cough, choking and shortness of breath.   Cardiovascular: Negative for chest pain and leg swelling.  Gastrointestinal: Negative for abdominal pain and constipation.  Genitourinary: Negative for dysuria.  Musculoskeletal: Positive for arthralgias. Negative for neck pain and neck stiffness.  Neurological: Negative for dizziness, weakness and headaches.  Psychiatric/Behavioral: Positive for confusion. Negative for suicidal ideas, hallucinations, behavioral problems, sleep disturbance, self-injury, dysphoric mood and agitation. The patient is nervous/anxious. The patient is not hyperactive.     Immunization History  Administered Date(s) Administered  . Influenza Whole 01/24/2012, 01/29/2013  . Influenza-Unspecified 01/20/2014, 02/05/2015  . PPD Test 10/10/2011  . Pneumococcal Polysaccharide-23 02/10/2004   Pertinent  Health Maintenance Due  Topic Date Due  . DEXA SCAN  01/28/1984  . PNA vac Low Risk Adult (2 of 2 - PCV13) 02/09/2005  . INFLUENZA VACCINE  11/16/2015   No flowsheet data found. Functional Status Survey:   Wt Readings from Last 3 Encounters:  09/06/15 172 lb 12.8 oz (78.382 kg)  08/03/15 173 lb (78.472 kg)  08/02/15 173 lb 6.4 oz (78.654 kg)    Filed Vitals:   09/06/15 1512  BP: 143/69  Pulse: 78  Temp: 98 F (36.7 C)  Resp: 20  Weight: 172 lb 12.8 oz (78.382 kg)   Body mass index is 33.75 kg/(m^2). Physical Exam  Constitutional: She appears well-developed  and well-nourished. No distress.  HENT:  Head: Normocephalic and atraumatic.  Mouth/Throat: Oropharynx is clear and moist.  Neck: No JVD present.  Cardiovascular: Normal rate, regular rhythm and normal heart sounds.   Pulmonary/Chest: Effort normal and breath sounds normal. No respiratory distress. She has no wheezes.  Abdominal: Soft. Bowel sounds are normal. She exhibits no mass. There is no tenderness.  Musculoskeletal: She exhibits no edema or tenderness.  Lymphadenopathy:    She has no cervical adenopathy.  Neurological: She is alert.  Skin: Skin is warm and dry. No rash noted. She is not diaphoretic.  Psychiatric: She has a normal mood and affect.  Nursing note and vitals reviewed.   Labs reviewed:  Recent Labs  10/08/14 04/06/15  NA 139 140  K 4.1 4.2  BUN 31* 27*  CREATININE 0.9 1.0   No results for input(s): AST, ALT, ALKPHOS, BILITOT, PROT, ALBUMIN in the last 8760 hours.  Recent Labs  10/08/14 12/24/14  WBC 4.6 4.5  HGB 12.9 14.5  HCT 38 43  PLT 243 236   Lab Results  Component Value Date   TSH 1.27 04/06/2015   No results found for: HGBA1C Lab Results  Component Value Date   CHOL 171 08/07/2013   HDL 42 08/07/2013   LDLCALC 109 08/07/2013   TRIG 102 08/07/2013    Significant Diagnostic Results in last 30 days:  No results found.  Assessment/Plan  Esophageal dysmotility Stable, continue regular diet (family has signed waiver).  Continue oral care. Continue prilosec.  Allergic rhinitis Stable. Continue claritin and atrovent.  Essential hypertension Controlled. Continue toprol and lasix. Check CMP.  Memory loss or impairment Fairly stable over the past year. Not on meds due to lack of benefit   Cindi Carbon, [place] [phone]

## 2015-09-06 NOTE — Assessment & Plan Note (Signed)
Stable, continue regular diet (family has signed waiver).  Continue oral care. Continue prilosec.

## 2015-09-06 NOTE — Assessment & Plan Note (Addendum)
Controlled. Continue toprol and lasix. Check CMP.

## 2015-09-09 LAB — BASIC METABOLIC PANEL
BUN: 20 mg/dL (ref 4–21)
CREATININE: 0.8 mg/dL (ref 0.5–1.1)
GLUCOSE: 88 mg/dL
Potassium: 4.2 mmol/L (ref 3.4–5.3)
Sodium: 141 mmol/L (ref 137–147)

## 2015-09-09 LAB — CBC AND DIFFERENTIAL
HEMATOCRIT: 44 % (ref 36–46)
Hemoglobin: 13.1 g/dL (ref 12.0–16.0)
PLATELETS: 252 10*3/uL (ref 150–399)
WBC: 5.1 10*3/mL

## 2015-09-25 DIAGNOSIS — M7581 Other shoulder lesions, right shoulder: Secondary | ICD-10-CM | POA: Insufficient documentation

## 2015-10-07 ENCOUNTER — Non-Acute Institutional Stay (SKILLED_NURSING_FACILITY): Payer: Medicare Other | Admitting: Adult Health

## 2015-10-07 DIAGNOSIS — K5901 Slow transit constipation: Secondary | ICD-10-CM

## 2015-10-07 DIAGNOSIS — J3089 Other allergic rhinitis: Secondary | ICD-10-CM | POA: Diagnosis not present

## 2015-10-07 DIAGNOSIS — G4700 Insomnia, unspecified: Secondary | ICD-10-CM | POA: Diagnosis not present

## 2015-10-07 DIAGNOSIS — I10 Essential (primary) hypertension: Secondary | ICD-10-CM

## 2015-10-07 DIAGNOSIS — R413 Other amnesia: Secondary | ICD-10-CM | POA: Diagnosis not present

## 2015-10-07 DIAGNOSIS — M25511 Pain in right shoulder: Secondary | ICD-10-CM | POA: Diagnosis not present

## 2015-10-12 ENCOUNTER — Encounter: Payer: Self-pay | Admitting: Adult Health

## 2015-10-12 DIAGNOSIS — G4700 Insomnia, unspecified: Secondary | ICD-10-CM | POA: Insufficient documentation

## 2015-10-12 NOTE — Progress Notes (Signed)
Patient ID: [name], female   DOB: 06-Jan-1917, 82 y.o.   MRN: [id number]  Location:   [facility]   Place of Service:   SNF Provider:   Cindi Carbon, ANP [facility] [phone]   Hollace Kinnier, DO  Patient Care Team: Gayland Curry, DO as PCP - General (Geriatric Medicine) [facility] Richmond Campbell, MD as Consulting Physician (Gastroenterology) Melina Schools, MD as Consulting Physician (Orthopedic Surgery)  Extended Emergency Contact Information Primary Emergency Contact: [facility] Phone: [phone] Relation: None Secondary Emergency Contact: [facility] Address: [address]          [place]  [place] Home Phone: [phone] Relation: None  Code Status:  DNR Goals of care: Advanced Directive information Advanced Directives 08/03/2015  Does patient have an advance directive? Yes  Type of Advance Directive Out of facility DNR (pink MOST or yellow form)  Copy of advanced directive(s) in chart? Yes  Pre-existing out of facility DNR order (yellow form or pink MOST form) Yellow form placed in chart (order not valid for inpatient use)     Chief Complaint  Patient presents with  . Medical Management of Chronic Issues    HPI:  Pt is a 82 y.o. female seen today for medical management of chronic diseases.  She resides in skilled care due to memory loss and immobility. There are no complaints regarding her care. Her VS have been stable. Weight stable at 175 lbs. She has progressive dementia, last MMSE 20/30 on 12/16/14 with a failed clock.  Continues to report right shoulder pain and decreased ROM. Aspercreme was ordered prn in early June but has only been used one time. She is having issues opening and closing her door and getting dressed. Reports regular BMs on miralax and senokot s Has a hx of allergic rhinitis and chronic cough on atrovent and claritin Sleeps well with melatonin qhs BP range 138-174/75-96 on  toprol xl   Past Medical History  Diagnosis Date  . Allergic rhinitis 07/23/2012  . Unspecified malignant neoplasm of skin of other and unspecified parts of face 2012  . Malignant neoplasm of breast (female), unspecified site 42  . Neoplasm of unspecified nature of breast 2000  . Vitamin D deficiency 2009  . Other and unspecified hyperlipidemia 2005  . Anemia, unspecified 2005  . Anxiety state, unspecified 2005  . Depressive disorder, not elsewhere classified 2005  . Senile cataract, unspecified 2005  . Unspecified essential hypertension 2005  . Subdural hemorrhage ([employer]) 2007  . External hemorrhoids without mention of complication 0000000  . Unspecified venous (peripheral) insufficiency 2001  . Disturbance of salivary secretion 2007  . GERD (gastroesophageal reflux disease) 2005  . Hypertonicity of bladder 2006  . Urinary tract infection, site not specified 2013  . Other seborrheic dermatitis 2007  . Osteoarthrosis, unspecified whether generalized or localized, unspecified site 2005  . Pain in joint, shoulder region 2008  . Senile osteoporosis 2000  . Insomnia, unspecified 2005  . Abnormality of gait 2007  . Unspecified urinary incontinence 2005  . Vertebral fracture, closed 2006  . Closed fracture of sternum 2006  . Closed fracture of two ribs 2013    left 5,6th  . Pain in joint, shoulder region 10/21/2012  . Basal cell carcinoma, face 03/20/2013  . Dysphagia     with chronic aspiration  . Hyperglycemia   . Depression   . Depression with anxiety   . Hyponatremia    Past Surgical History  Procedure Laterality Date  .  Joint replacement Left     Knee  . Abdominal hysterectomy    . Breast surgery Right     Mastectomy  . Appendectomy    . Mohs surgery  2010    Nose  . Cyst excision Left 2011    Calcified cyst L shin  . Esophagogastroduodenoscopy endoscopy  07/11/2013    Dr. Watt Climes hiatus hernia  . Intramedullary (im) nail intertrochanteric Right 02/15/2014    Procedure:  INTRAMEDULLARY (IM) NAIL INTERTROCHANTRIC;  Surgeon: Melina Schools, MD;  Location: [facility];  Service: Orthopedics;  Laterality: Right;    Allergies  Allergen Reactions  . Other     "Epidural medication" per NH record  . Sulfa Antibiotics       Medication List       This list is accurate as of: 10/07/15 11:59 PM.  Always use your most recent med list.               acetaminophen 325 MG tablet  Commonly known as:  TYLENOL  Take 650 mg by mouth 2 (two) times daily. May have additional 650mg  daily as needed for pain relief     DULoxetine 60 MG capsule  Commonly known as:  CYMBALTA  Take 60 mg by mouth daily.     furosemide 20 MG tablet  Commonly known as:  LASIX  Take 20 mg by mouth daily.     hydrocortisone cream 1 %  Apply 1 application topically. Apply as needed for itching     ipratropium 0.06 % nasal spray  Commonly known as:  ATROVENT  Place 2 sprays into both nostrils 3 (three) times daily.     ipratropium-albuterol 0.5-2.5 (3) MG/3ML Soln  Commonly known as:  DUONEB  Take 3 mLs by nebulization every 6 (six) hours as needed (wheezing).     loratadine 10 MG tablet  Commonly known as:  CLARITIN  Take 10 mg by mouth daily.     LORazepam 0.5 MG tablet  Commonly known as:  ATIVAN  Take one tablet by mouth three times daily     Melatonin 5 MG Tabs  Take 1 tablet by mouth at bedtime. For insomnia     metoprolol succinate 25 MG 24 hr tablet  Commonly known as:  TOPROL-XL  Take 12.5 mg by mouth at bedtime. For HTN     omeprazole 20 MG capsule  Commonly known as:  PRILOSEC  Take 20 mg by mouth 2 (two) times daily before a meal.     polyethylene glycol packet  Commonly known as:  MIRALAX / GLYCOLAX  Take 17 g by mouth daily.     potassium chloride 10 MEQ tablet  Commonly known as:  K-DUR,KLOR-CON  Take 10 mEq by mouth daily.     sennosides-docusate sodium 8.6-50 MG tablet  Commonly known as:  SENOKOT-S  Take 2 tablets by mouth at bedtime. For constipation      SODIUM FLUORIDE (DENTAL RINSE) 0.02 % Soln  Use as directed 15 mLs in the mouth or throat 4 (four) times daily -  before meals and at bedtime.     traMADol 50 MG tablet  Commonly known as:  ULTRAM  Take 50 mg by mouth 3 (three) times daily as needed.     trolamine salicylate 10 % cream  Commonly known as:  ASPERCREME  Apply 1 application topically 3 (three) times daily. Apply to both knees        Review of Systems  Constitutional: Negative for fever, chills, activity change,  appetite change and fatigue.  HENT: Positive for postnasal drip. Negative for congestion, sinus pressure, sneezing, sore throat and tinnitus.   Respiratory: Negative for apnea, cough, choking and shortness of breath.   Cardiovascular: Negative for chest pain and leg swelling.  Gastrointestinal: Negative for abdominal pain and constipation.  Genitourinary: Negative for dysuria.  Musculoskeletal: Positive for arthralgias and gait problem. Negative for back pain, neck pain and neck stiffness.  Neurological: Negative for dizziness, weakness and headaches.  Psychiatric/Behavioral: Positive for confusion. Negative for suicidal ideas, hallucinations, behavioral problems, sleep disturbance, self-injury, dysphoric mood and agitation. The patient is nervous/anxious. The patient is not hyperactive.     Immunization History  Administered Date(s) Administered  . Influenza Whole 01/24/2012, 01/29/2013  . Influenza-Unspecified 01/20/2014, 02/05/2015  . PPD Test 10/10/2011  . Pneumococcal Polysaccharide-23 02/10/2004   Pertinent  Health Maintenance Due  Topic Date Due  . DEXA SCAN  01/28/1984  . PNA vac Low Risk Adult (2 of 2 - PCV13) 02/09/2005  . INFLUENZA VACCINE  11/16/2015   No flowsheet data found. Functional Status Survey:   Wt Readings from Last 3 Encounters:  10/12/15 175 lb 1.6 oz (79.425 kg)  09/06/15 172 lb 12.8 oz (78.382 kg)  08/03/15 173 lb (78.472 kg)    Filed Vitals:   10/12/15 0731    Weight: 175 lb 1.6 oz (79.425 kg)   Body mass index is 34.2 kg/(m^2). Physical Exam  Constitutional: She appears well-developed and well-nourished. No distress.  HENT:  Head: Normocephalic and atraumatic.  Mouth/Throat: Oropharynx is clear and moist.  Neck: No JVD present.  Cardiovascular: Normal rate, regular rhythm and normal heart sounds.   Pulmonary/Chest: Effort normal and breath sounds normal. No respiratory distress. She has no wheezes.  Abdominal: Soft. Bowel sounds are normal. She exhibits no mass. There is no tenderness.  Musculoskeletal: She exhibits tenderness (right rotator cuff area tender, decreased ROM). She exhibits no edema.  Lymphadenopathy:    She has no cervical adenopathy.  Neurological: She is alert.  Skin: Skin is warm and dry. No rash noted. She is not diaphoretic.  Psychiatric: She has a normal mood and affect.  Nursing note and vitals reviewed.   Labs reviewed:  Recent Labs  04/06/15 09/09/15  NA 140 141  K 4.2 4.2  BUN 27* 20  CREATININE 1.0 0.8   No results for input(s): AST, ALT, ALKPHOS, BILITOT, PROT, ALBUMIN in the last 8760 hours.  Recent Labs  12/24/14 09/09/15  WBC 4.5 5.1  HGB 14.5 13.1  HCT 43 44  PLT 236 252   Lab Results  Component Value Date   TSH 1.27 04/06/2015   No results found for: HGBA1C Lab Results  Component Value Date   CHOL 171 08/07/2013   HDL 42 08/07/2013   LDLCALC 109 08/07/2013   TRIG 102 08/07/2013    Significant Diagnostic Results in last 30 days:  No results found.  Assessment/Plan  1. Right shoulder pain Schedule aspercreme BID to right shoulder  Continue scheduled tylenol and ultram prn Consider OT  2. Essential hypertension Elevated at times but given her age would not be aggressive with management Cotinue toprol Goal <150/90  3. Other allergic rhinitis Stable with chronic cough improved over time Continue atrovent spray and claritin  4. Slow transit constipation Stable Continue  miralax and senokot s  5. Memory loss or impairment Stable short term memory loss No behavioral issues Skilled care services  6. Insomnia Stable Continue melatonin  Cindi Carbon, ANP [facility] [phone])  [phone]      

## 2015-11-01 ENCOUNTER — Non-Acute Institutional Stay (SKILLED_NURSING_FACILITY): Payer: Medicare Other | Admitting: Adult Health

## 2015-11-01 ENCOUNTER — Encounter: Payer: Self-pay | Admitting: Adult Health

## 2015-11-01 DIAGNOSIS — M25511 Pain in right shoulder: Secondary | ICD-10-CM | POA: Diagnosis not present

## 2015-11-01 NOTE — Progress Notes (Signed)
Patient ID: [name], female   DOB: 1916/10/07, 82 y.o.   MRN: [id number]   Location:   [facility]   Place of Service:  SNF (31) Provider:   Cindi Carbon, ANP [facility] [phone]   Hollace Kinnier, DO  Patient Care Team: Gayland Curry, DO as PCP - General (Geriatric Medicine) [facility] Richmond Campbell, MD as Consulting Physician (Gastroenterology) Melina Schools, MD as Consulting Physician (Orthopedic Surgery)  Extended Emergency Contact Information Primary Emergency Contact: [facility] Phone: [phone] Relation: None Secondary Emergency Contact: [facility] Address: [address]          [place]  [place] Home Phone: [phone] Relation: None  Code Status:  DNR Goals of care: Advanced Directive information Advanced Directives 08/03/2015  Does patient have an advance directive? Yes  Type of Advance Directive Out of facility DNR (pink MOST or yellow form)  Copy of advanced directive(s) in chart? Yes  Pre-existing out of facility DNR order (yellow form or pink MOST form) Yellow form placed in chart (order not valid for inpatient use)     Chief Complaint  Patient presents with  . Acute Visit    right shoulder pain    HPI:  Pt is a 82 y.o. female seen today for an acute visit for right shoulder pain. She has been reporting this for several weeks and working with therapy.  A right shoulder xray was obtained showing a high riding humerus, indicating rotator cuff tendinopathy.  Past Medical History  Diagnosis Date  . Allergic rhinitis 07/23/2012  . Unspecified malignant neoplasm of skin of other and unspecified parts of face 2012  . Malignant neoplasm of breast (female), unspecified site 37  . Neoplasm of unspecified nature of breast 2000  . Vitamin D deficiency 2009  . Other and unspecified hyperlipidemia 2005  . Anemia, unspecified 2005  . Anxiety state, unspecified 2005  . Depressive disorder, not  elsewhere classified 2005  . Senile cataract, unspecified 2005  . Unspecified essential hypertension 2005  . Subdural hemorrhage ([place]) 2007  . External hemorrhoids without mention of complication 0000000  . Unspecified venous (peripheral) insufficiency 2001  . Disturbance of salivary secretion 2007  . GERD (gastroesophageal reflux disease) 2005  . Hypertonicity of bladder 2006  . Urinary tract infection, site not specified 2013  . Other seborrheic dermatitis 2007  . Osteoarthrosis, unspecified whether generalized or localized, unspecified site 2005  . Pain in joint, shoulder region 2008  . Senile osteoporosis 2000  . Insomnia, unspecified 2005  . Abnormality of gait 2007  . Unspecified urinary incontinence 2005  . Vertebral fracture, closed 2006  . Closed fracture of sternum 2006  . Closed fracture of two ribs 2013    left 5,6th  . Pain in joint, shoulder region 10/21/2012  . Basal cell carcinoma, face 03/20/2013  . Dysphagia     with chronic aspiration  . Hyperglycemia   . Depression   . Depression with anxiety   . Hyponatremia    Past Surgical History  Procedure Laterality Date  . Joint replacement Left     Knee  . Abdominal hysterectomy    . Breast surgery Right     Mastectomy  . Appendectomy    . Mohs surgery  2010    Nose  . Cyst excision Left 2011    Calcified cyst L shin  . Esophagogastroduodenoscopy endoscopy  07/11/2013    Dr. Watt Climes hiatus hernia  . Intramedullary (im) nail intertrochanteric Right 02/15/2014  Procedure: INTRAMEDULLARY (IM) NAIL INTERTROCHANTRIC;  Surgeon: Melina Schools, MD;  Location: [facility];  Service: Orthopedics;  Laterality: Right;    Allergies  Allergen Reactions  . Other     "Epidural medication" per NH record  . Sulfa Antibiotics       Medication List       This list is accurate as of: 11/01/15  2:56 PM.  Always use your most recent med list.               acetaminophen 325 MG tablet  Commonly known as:  TYLENOL  Take 650 mg  by mouth 2 (two) times daily. May have additional 650mg  daily as needed for pain relief     DULoxetine 60 MG capsule  Commonly known as:  CYMBALTA  Take 60 mg by mouth daily.     furosemide 20 MG tablet  Commonly known as:  LASIX  Take 20 mg by mouth daily.     hydrocortisone cream 1 %  Apply 1 application topically. Apply as needed for itching     ipratropium 0.06 % nasal spray  Commonly known as:  ATROVENT  Place 2 sprays into both nostrils 3 (three) times daily.     ipratropium-albuterol 0.5-2.5 (3) MG/3ML Soln  Commonly known as:  DUONEB  Take 3 mLs by nebulization every 6 (six) hours as needed (wheezing).     loratadine 10 MG tablet  Commonly known as:  CLARITIN  Take 10 mg by mouth daily.     LORazepam 0.5 MG tablet  Commonly known as:  ATIVAN  Take one tablet by mouth three times daily     Melatonin 5 MG Tabs  Take 1 tablet by mouth at bedtime. For insomnia     metoprolol succinate 25 MG 24 hr tablet  Commonly known as:  TOPROL-XL  Take 12.5 mg by mouth at bedtime. For HTN     omeprazole 20 MG capsule  Commonly known as:  PRILOSEC  Take 20 mg by mouth 2 (two) times daily before a meal.     polyethylene glycol packet  Commonly known as:  MIRALAX / GLYCOLAX  Take 17 g by mouth daily.     potassium chloride 10 MEQ tablet  Commonly known as:  K-DUR,KLOR-CON  Take 10 mEq by mouth daily.     sennosides-docusate sodium 8.6-50 MG tablet  Commonly known as:  SENOKOT-S  Take 2 tablets by mouth at bedtime. For constipation     SODIUM FLUORIDE (DENTAL RINSE) 0.02 % Soln  Use as directed 15 mLs in the mouth or throat 4 (four) times daily -  before meals and at bedtime.     traMADol 50 MG tablet  Commonly known as:  ULTRAM  Take 50 mg by mouth 3 (three) times daily as needed.     trolamine salicylate 10 % cream  Commonly known as:  ASPERCREME  Apply 1 application topically 3 (three) times daily. Apply to both knees        Review of Systems  Constitutional:  Negative for fever, chills, diaphoresis, activity change, appetite change, fatigue and unexpected weight change.  Musculoskeletal: Positive for arthralgias and gait problem. Negative for myalgias, back pain and joint swelling.    Immunization History  Administered Date(s) Administered  . Influenza Whole 01/24/2012, 01/29/2013  . Influenza-Unspecified 01/20/2014, 02/05/2015  . PPD Test 10/10/2011  . Pneumococcal Polysaccharide-23 02/10/2004   Pertinent  Health Maintenance Due  Topic Date Due  . DEXA SCAN  01/28/1984  . PNA vac  Low Risk Adult (2 of 2 - PCV13) 02/09/2005  . INFLUENZA VACCINE  11/16/2015   No flowsheet data found. Functional Status Survey:    There were no vitals filed for this visit. There is no weight on file to calculate BMI. Physical Exam  Constitutional: She appears well-developed and well-nourished. No distress.  Musculoskeletal: She exhibits no edema.  Right shoulder with decreased rom and tenderness at the [facility] space. Positive empty can test.  No swelling or redness.  Skin: She is not diaphoretic.    Labs reviewed:  Recent Labs  04/06/15 09/09/15  NA 140 141  K 4.2 4.2  BUN 27* 20  CREATININE 1.0 0.8   No results for input(s): AST, ALT, ALKPHOS, BILITOT, PROT, ALBUMIN in the last 8760 hours.  Recent Labs  12/24/14 09/09/15  WBC 4.5 5.1  HGB 14.5 13.1  HCT 43 44  PLT 236 252   Lab Results  Component Value Date   TSH 1.27 04/06/2015   No results found for: HGBA1C Lab Results  Component Value Date   CHOL 171 08/07/2013   HDL 42 08/07/2013   LDLCALC 109 08/07/2013   TRIG 102 08/07/2013    Significant Diagnostic Results in last 30 days:  No results found.  Assessment/Plan  1. Right shoulder pain -has rotator cuff tendonitis, as well as osteoarthritis -not a surgical candidate -continue occupational therapy -Right shoulder injected with 5 cc lidocaine and 40 mg of Depomedrol, cleaned with betadine and ethyl chloride spray applied.  Posterior approach to the subacromial space without difficulty 10cc syringe with 23 gauge 1.5 inch needle, aspirated prior to injection.     Family/ staff Communication: Prior consent obtained from her daughter, Gwenette Greet ordered:  NA

## 2015-11-16 LAB — CBC AND DIFFERENTIAL
HCT: 44 % (ref 36–46)
Hemoglobin: 14.6 g/dL (ref 12.0–16.0)
Platelets: 256 10*3/uL (ref 150–399)
WBC: 11.8 10^3/mL

## 2015-11-23 ENCOUNTER — Encounter: Payer: Self-pay | Admitting: Internal Medicine

## 2015-11-23 ENCOUNTER — Non-Acute Institutional Stay (SKILLED_NURSING_FACILITY): Payer: Medicare Other | Admitting: Internal Medicine

## 2015-11-23 DIAGNOSIS — K5901 Slow transit constipation: Secondary | ICD-10-CM | POA: Diagnosis not present

## 2015-11-23 DIAGNOSIS — M7581 Other shoulder lesions, right shoulder: Secondary | ICD-10-CM

## 2015-11-23 DIAGNOSIS — R269 Unspecified abnormalities of gait and mobility: Secondary | ICD-10-CM | POA: Diagnosis not present

## 2015-11-23 DIAGNOSIS — R413 Other amnesia: Secondary | ICD-10-CM

## 2015-11-23 DIAGNOSIS — K224 Dyskinesia of esophagus: Secondary | ICD-10-CM

## 2015-11-23 DIAGNOSIS — I10 Essential (primary) hypertension: Secondary | ICD-10-CM | POA: Diagnosis not present

## 2015-11-23 NOTE — Progress Notes (Signed)
Patient ID: [name], female   DOB: [birth year], 82 y.o.   MRN: [id number]  Location:   [place] Room Number: [id number] of Service:  SNF (31) Provider:  Sayer Masini L. Mariea Clonts, D.O., C.M.D.  Hollace Kinnier, DO  Patient Care Team: Gayland Curry, DO as PCP - General (Geriatric Medicine) [facility] [place] Campbell, MD as Consulting Physician (Gastroenterology) Melina Schools, MD as Consulting Physician (Orthopedic Surgery)  Extended Emergency Contact Information Primary Emergency Contact: [facility] Phone: [phone] Relation: None Secondary Emergency Contact: [facility] Address: [address]          [place]  [place] Home Phone: [phone] Relation: None  Code Status:  DNR Goals of care: Advanced Directive information Advanced Directives 11/23/2015  Does patient have an advance directive? Yes  Type of Advance Directive Out of facility DNR (pink MOST or yellow form);Living will;Healthcare Power of Attorney  Copy of advanced directive(s) in chart? Yes  Pre-existing out of facility DNR order (yellow form or pink MOST form) Yellow form placed in chart (order not valid for inpatient use)  Some encounter information is confidential and restricted. Go to Review Flowsheets activity to see all data.     Chief Complaint  Patient presents with  . Medical Management of Chronic Issues    routine visit    HPI:  Pt is a 82 y.o. female seen today for medical management of chronic diseases.  She has been doing better with her shoulder pain since NP gave her a steroid injection.    She is still attending the activities regularly.  She's had to use a lift with her shoulder problems.    Dysphagia continues with chronic aspiration.  Fortunately, she's avoided pneumonia recently.  Mood is stable with cymbalta.    Short term memory loss seems to be worsening and she tells me about her pics of her family in her room each visit.  Past  Medical History:  Diagnosis Date  . Abnormality of gait 2007  . Allergic rhinitis 07/23/2012  . Anemia, unspecified 2005  . Anxiety state, unspecified 2005  . Basal cell carcinoma, face 03/20/2013  . Closed fracture of sternum 2006  . Closed fracture of two ribs 2013   left 5,6th  . Depression   . Depression with anxiety   . Depressive disorder, not elsewhere classified 2005  . Disturbance of salivary secretion 2007  . Dysphagia    with chronic aspiration  . External hemorrhoids without mention of complication 0000000  . GERD (gastroesophageal reflux disease) 2005  . Hyperglycemia   . Hypertonicity of bladder 2006  . Hyponatremia   . Insomnia, unspecified 2005  . Malignant neoplasm of breast (female), unspecified site 44  . Neoplasm of unspecified nature of breast 2000  . Osteoarthrosis, unspecified whether generalized or localized, unspecified site 2005  . Other and unspecified hyperlipidemia 2005  . Other seborrheic dermatitis 2007  . Pain in joint, shoulder region 2008  . Pain in joint, shoulder region 10/21/2012  . Senile cataract, unspecified 2005  . Senile osteoporosis 2000  . Subdural hemorrhage ([place]) 2007  . Unspecified essential hypertension 2005  . Unspecified malignant neoplasm of skin of other and unspecified parts of face 2012  . Unspecified urinary incontinence 2005  . Unspecified venous (peripheral) insufficiency 2001  . Urinary tract infection, site not specified 2013  . Vertebral fracture, closed 2006  . Vitamin D deficiency 2009   Past Surgical History:  Procedure Laterality Date  . ABDOMINAL HYSTERECTOMY    .  APPENDECTOMY    . BREAST SURGERY Right    Mastectomy  . CYST EXCISION Left 2011   Calcified cyst L shin  . ESOPHAGOGASTRODUODENOSCOPY ENDOSCOPY  07/11/2013   Dr. Watt Climes hiatus hernia  . INTRAMEDULLARY (IM) NAIL INTERTROCHANTERIC Right 02/15/2014   Procedure: INTRAMEDULLARY (IM) NAIL INTERTROCHANTRIC;  Surgeon: Melina Schools, MD;  Location: [facility];   Service: Orthopedics;  Laterality: Right;  . JOINT REPLACEMENT Left    Knee  . MOHS SURGERY  2010   Nose    Allergies  Allergen Reactions  . Other     "Epidural medication" per NH record  . Sulfa Antibiotics       Medication List       Accurate as of 11/23/15  1:57 PM. Always use your most recent med list.          acetaminophen 325 MG tablet Commonly known as:  TYLENOL Take 650 mg by mouth 2 (two) times daily. May have additional 650mg  daily as needed for pain relief   carboxymethylcellulose 0.5 % Soln Commonly known as:  REFRESH PLUS Place 2 drops into both eyes 2 (two) times daily as needed.   DULoxetine 60 MG capsule Commonly known as:  CYMBALTA Take 60 mg by mouth daily.   furosemide 20 MG tablet Commonly known as:  LASIX Take 20 mg by mouth daily.   hydrocortisone cream 1 % Apply 1 application topically. Apply as needed for itching   ipratropium 0.06 % nasal spray Commonly known as:  ATROVENT Place 2 sprays into both nostrils 3 (three) times daily.   ipratropium-albuterol 0.5-2.5 (3) MG/3ML Soln Commonly known as:  DUONEB Take 3 mLs by nebulization every 6 (six) hours as needed (wheezing).   levofloxacin 750 MG tablet Commonly known as:  LEVAQUIN Take 750 mg by mouth daily.   loratadine 10 MG tablet Commonly known as:  CLARITIN Take 10 mg by mouth daily.   LORazepam 0.5 MG tablet Commonly known as:  ATIVAN Take 0.5 mg by mouth 2 (two) times daily as needed (restlessness).   LORazepam 0.5 MG tablet Commonly known as:  ATIVAN Take one tablet by mouth three times daily   Melatonin 5 MG Tabs Take 1 tablet by mouth at bedtime. For insomnia   metoprolol succinate 25 MG 24 hr tablet Commonly known as:  TOPROL-XL Take 12.5 mg by mouth at bedtime. For HTN   omeprazole 20 MG capsule Commonly known as:  PRILOSEC Take 20 mg by mouth 2 (two) times daily before a meal.   polyethylene glycol packet Commonly known as:  MIRALAX / GLYCOLAX Take 17 g by  mouth daily.   potassium chloride 10 MEQ tablet Commonly known as:  K-DUR,KLOR-CON Take 10 mEq by mouth daily.   sennosides-docusate sodium 8.6-50 MG tablet Commonly known as:  SENOKOT-S Take 2 tablets by mouth at bedtime. For constipation   SODIUM FLUORIDE (DENTAL RINSE) 0.02 % Soln Use as directed 15 mLs in the mouth or throat 4 (four) times daily -  before meals and at bedtime.   traMADol 50 MG tablet Commonly known as:  ULTRAM Take 50 mg by mouth 3 (three) times daily as needed.   trolamine salicylate 10 % cream Commonly known as:  ASPERCREME Apply 1 application topically 3 (three) times daily. Apply to both knees       Review of Systems  Constitutional: Negative for activity change, appetite change, chills and fever.  HENT: Positive for congestion and hearing loss.   Eyes: Negative for visual disturbance.  Glasses  Respiratory: Positive for cough. Negative for shortness of breath and wheezing.   Cardiovascular: Negative for chest pain and leg swelling.  Gastrointestinal: Positive for constipation. Negative for abdominal distention, abdominal pain, diarrhea, nausea and vomiting.  Genitourinary: Negative for dysuria.  Musculoskeletal: Positive for arthralgias, gait problem and myalgias.  Skin: Negative for color change.  Neurological: Positive for weakness. Negative for dizziness.  Hematological: Bruises/bleeds easily.  Psychiatric/Behavioral: Positive for confusion. Negative for behavioral problems. The patient is nervous/anxious.     Immunization History  Administered Date(s) Administered  . Influenza Whole 01/24/2012, 01/29/2013  . Influenza-Unspecified 01/20/2014, 02/05/2015  . PPD Test 10/10/2011  . Pneumococcal Polysaccharide-23 02/10/2004   Pertinent  Health Maintenance Due  Topic Date Due  . DEXA SCAN  01/28/1984  . PNA vac Low Risk Adult (2 of 2 - PCV13) 02/09/2005  . INFLUENZA VACCINE  11/16/2015   No flowsheet data found. Functional Status  Survey:    Vitals:   11/23/15 1344  BP: 139/71  Pulse: 71  Resp: 19  Temp: 98.2 F (36.8 C)  TempSrc: Oral  SpO2: 97%  Weight: 167 lb (75.8 kg)   Body mass index is 32.61 kg/m. Physical Exam  Constitutional: She appears well-developed and well-nourished. No distress.  Cardiovascular: Normal rate and regular rhythm.   Pulmonary/Chest: Effort normal.  Some coarse rhonchi/upper airway congestive sounds  Abdominal: Soft. Bowel sounds are normal.  Musculoskeletal: She exhibits tenderness.  Neurological: She is alert.  Oriented to person and place, not time, repeats herself and tells me the same stories each visit  Psychiatric:  anxious    Labs reviewed:  Recent Labs  04/06/15 09/09/15  NA 140 141  K 4.2 4.2  BUN 27* 20  CREATININE 1.0 0.8   No results for input(s): AST, ALT, ALKPHOS, BILITOT, PROT, ALBUMIN in the last 8760 hours.  Recent Labs  12/24/14 09/09/15 11/16/15 0516  WBC 4.5 5.1 11.8  HGB 14.5 13.1 14.6  HCT 43 44 44  PLT 236 252 256   Lab Results  Component Value Date   TSH 1.27 04/06/2015   No results found for: HGBA1C Lab Results  Component Value Date   CHOL 171 08/07/2013   HDL 42 08/07/2013   LDLCALC 109 08/07/2013   TRIG 102 08/07/2013    Significant Diagnostic Results in last 30 days:  Right shoulder steroid injection 11/01/15 by NP Wert  Assessment/Plan 1. Right rotator cuff tendinitis -pain improved s/p steroid injection a couple of weeks ago -cont prn tramadol for severe pain and scheduled tylenol  2. Essential hypertension -bp at goal with current regimen and w/o c/o dizziness  3. Memory loss or impairment -is progressing gradually and not helped by her anxiolytics (she does seem to need these) and claritin (also has a lot of PND which this helps along with her dysmotility problem)  4. Slow transit constipation -continues on miralax and senna s for this with benefit  5. Esophageal dysmotility -leads to a lot of episodes of  coughing and hacking of sputum and recurrent aspiration, is on prilosec chronically to reduce the acidity some and help with chronic cough  6. Gait disorder -having to use lift due to her rotator cuff tendinitis and decreased ROM -gets around in manual wheelchair  Family/ staff Communication: discussed with SNF nurse  Labs/tests ordered:  No new today

## 2015-11-25 ENCOUNTER — Other Ambulatory Visit: Payer: Self-pay | Admitting: *Deleted

## 2015-11-25 MED ORDER — LORAZEPAM 0.5 MG PO TABS: ORAL_TABLET | ORAL | 5 refills | Status: DC

## 2015-11-25 NOTE — Telephone Encounter (Signed)
[facility] #[phone] Fax: [phone]  

## 2015-12-27 ENCOUNTER — Non-Acute Institutional Stay (SKILLED_NURSING_FACILITY): Payer: Medicare Other | Admitting: Adult Health

## 2015-12-27 DIAGNOSIS — R1310 Dysphagia, unspecified: Secondary | ICD-10-CM

## 2015-12-27 DIAGNOSIS — K5901 Slow transit constipation: Secondary | ICD-10-CM

## 2015-12-27 DIAGNOSIS — G4700 Insomnia, unspecified: Secondary | ICD-10-CM | POA: Diagnosis not present

## 2015-12-27 DIAGNOSIS — R413 Other amnesia: Secondary | ICD-10-CM | POA: Diagnosis not present

## 2015-12-27 DIAGNOSIS — M7581 Other shoulder lesions, right shoulder: Secondary | ICD-10-CM | POA: Diagnosis not present

## 2015-12-27 DIAGNOSIS — I10 Essential (primary) hypertension: Secondary | ICD-10-CM

## 2015-12-27 NOTE — Progress Notes (Signed)
Patient ID: [name], female   DOB: Sep 10, 1916, 82 y.o.   MRN: [id number]  Location:   [facility]   Place of Service:  SNF (31) Provider:   Cindi Carbon, ANP [facility] [phone]   Hollace Kinnier, DO  Patient Care Team: Gayland Curry, DO as PCP - General (Geriatric Medicine) [facility] [place] Campbell, MD as Consulting Physician (Gastroenterology) Melina Schools, MD as Consulting Physician (Orthopedic Surgery)  Extended Emergency Contact Information Primary Emergency Contact: [facility] Phone: [phone] Relation: None Secondary Emergency Contact: [facility] Address: [address]          [place]  [place] Home Phone: [phone] Relation: None  Code Status:  DNR Goals of care: Advanced Directive information Advanced Directives 11/23/2015  Does patient have an advance directive? Yes  Type of Advance Directive Out of facility DNR (pink MOST or yellow form);Living will;Healthcare Power of Attorney  Copy of advanced directive(s) in chart? Yes  Pre-existing out of facility DNR order (yellow form or pink MOST form) Yellow form placed in chart (order not valid for inpatient use)  Some encounter information is confidential and restricted. Go to Review Flowsheets activity to see all data.     Chief Complaint  Patient presents with  . Medical Management of Chronic Issues    HPI:  Pt is a 82 y.o. female seen today for medical management of chronic diseases.  She resides in skilled care due to memory loss and immobility. There are no complaints regarding her care. Her VS have been stable. Weight stable at 167 lb over the last month. She has progressive dementia, last MMSE 20/30 on 12/16/14 with a failed clock, deficits in serial 7s, orientation and recall. She had pneumonia on 8/1 and finished course of abx therapy. She reports no cough, congestion, or shortness of breath. She takes melatonin and is sleeping well. She has  had a good appetite. She is on a D2 ground nectar thickened liquid diet due to history of dysphagia. She reports no trouble chewing or swallowing. She has not had any issues with aspiration. She reports that she has daily BMs. She takes Senokot daily and Miralax prn. She takes Metoprolol, lasix and potassium for HTN management. Her BPs have ranged 154-164/68-83. She has no swelling in her LEs. She reports decreased pain from R rotator cuff tendonitis. She received a joint injection for her tendonitis in July.   Past Medical History:  Diagnosis Date  . Abnormality of gait 2007  . Allergic rhinitis 07/23/2012  . Anemia, unspecified 2005  . Anxiety state, unspecified 2005  . Basal cell carcinoma, face 03/20/2013  . Closed fracture of sternum 2006  . Closed fracture of two ribs 2013   left 5,6th  . Depression   . Depression with anxiety   . Depressive disorder, not elsewhere classified 2005  . Disturbance of salivary secretion 2007  . Dysphagia    with chronic aspiration  . External hemorrhoids without mention of complication 0000000  . GERD (gastroesophageal reflux disease) 2005  . Hyperglycemia   . Hypertonicity of bladder 2006  . Hyponatremia   . Insomnia, unspecified 2005  . Malignant neoplasm of breast (female), unspecified site 11  . Neoplasm of unspecified nature of breast 2000  . Osteoarthrosis, unspecified whether generalized or localized, unspecified site 2005  . Other and unspecified hyperlipidemia 2005  . Other seborrheic dermatitis 2007  . Pain in joint, shoulder region 2008  . Pain in joint, shoulder region 10/21/2012  . Senile  cataract, unspecified 2005  . Senile osteoporosis 2000  . Subdural hemorrhage ([place]) 2007  . Unspecified essential hypertension 2005  . Unspecified malignant neoplasm of skin of other and unspecified parts of face 2012  . Unspecified urinary incontinence 2005  . Unspecified venous (peripheral) insufficiency 2001  . Urinary tract infection, site not  specified 2013  . Vertebral fracture, closed 2006  . Vitamin D deficiency 2009   Past Surgical History:  Procedure Laterality Date  . ABDOMINAL HYSTERECTOMY    . APPENDECTOMY    . BREAST SURGERY Right    Mastectomy  . CYST EXCISION Left 2011   Calcified cyst L shin  . ESOPHAGOGASTRODUODENOSCOPY ENDOSCOPY  07/11/2013   Dr. Watt Climes hiatus hernia  . INTRAMEDULLARY (IM) NAIL INTERTROCHANTERIC Right 02/15/2014   Procedure: INTRAMEDULLARY (IM) NAIL INTERTROCHANTRIC;  Surgeon: Melina Schools, MD;  Location: [facility];  Service: Orthopedics;  Laterality: Right;  . JOINT REPLACEMENT Left    Knee  . MOHS SURGERY  2010   Nose    Allergies  Allergen Reactions  . Other     "Epidural medication" per NH record  . Sulfa Antibiotics       Medication List       Accurate as of 12/27/15  4:40 PM. Always use your most recent med list.          acetaminophen 325 MG tablet Commonly known as:  TYLENOL Take 650 mg by mouth 2 (two) times daily. May have additional 650mg  daily as needed for pain relief   carboxymethylcellulose 0.5 % Soln Commonly known as:  REFRESH PLUS Place 2 drops into both eyes 2 (two) times daily as needed.   DULoxetine 60 MG capsule Commonly known as:  CYMBALTA Take 60 mg by mouth daily.   furosemide 20 MG tablet Commonly known as:  LASIX Take 20 mg by mouth daily.   hydrocortisone cream 1 % Apply 1 application topically. Apply as needed for itching   ipratropium 0.06 % nasal spray Commonly known as:  ATROVENT Place 2 sprays into both nostrils 3 (three) times daily.   ipratropium-albuterol 0.5-2.5 (3) MG/3ML Soln Commonly known as:  DUONEB Take 3 mLs by nebulization every 6 (six) hours as needed (wheezing).   loratadine 10 MG tablet Commonly known as:  CLARITIN Take 10 mg by mouth daily.   LORazepam 0.5 MG tablet Commonly known as:  ATIVAN Take 0.5 mg by mouth 2 (two) times daily as needed (restlessness).   LORazepam 0.5 MG tablet Commonly known as:   ATIVAN Take one tablet by mouth three times daily   Melatonin 5 MG Tabs Take 1 tablet by mouth at bedtime. For insomnia   metoprolol succinate 25 MG 24 hr tablet Commonly known as:  TOPROL-XL Take 12.5 mg by mouth at bedtime. For HTN   omeprazole 20 MG capsule Commonly known as:  PRILOSEC Take 20 mg by mouth 2 (two) times daily before a meal.   polyethylene glycol packet Commonly known as:  MIRALAX / GLYCOLAX Take 17 g by mouth daily as needed.   potassium chloride 10 MEQ tablet Commonly known as:  K-DUR,KLOR-CON Take 10 mEq by mouth daily.   sennosides-docusate sodium 8.6-50 MG tablet Commonly known as:  SENOKOT-S Take 2 tablets by mouth at bedtime. For constipation   SODIUM FLUORIDE (DENTAL RINSE) 0.02 % Soln Use as directed 15 mLs in the mouth or throat 4 (four) times daily -  before meals and at bedtime.   traMADol 50 MG tablet Commonly known as:  ULTRAM Take  50 mg by mouth 3 (three) times daily as needed.       Review of Systems  Constitutional: Negative for activity change, appetite change, chills, fatigue and fever.  HENT: Negative for congestion, sneezing, sore throat and trouble swallowing.   Respiratory: Negative for apnea, cough, choking and shortness of breath.   Cardiovascular: Negative for chest pain, palpitations and leg swelling.  Gastrointestinal: Negative for abdominal pain, constipation, diarrhea, nausea and vomiting.  Genitourinary: Negative for dysuria, flank pain and frequency.  Musculoskeletal: Positive for arthralgias. Negative for joint swelling, neck pain and neck stiffness.  Neurological: Negative for dizziness, weakness, numbness and headaches.  Psychiatric/Behavioral: Positive for confusion. Negative for agitation, behavioral problems, dysphoric mood, hallucinations, self-injury, sleep disturbance and suicidal ideas. The patient is nervous/anxious. The patient is not hyperactive.     Immunization History  Administered Date(s) Administered    . Influenza Whole 01/24/2012, 01/29/2013  . Influenza-Unspecified 01/20/2014, 02/05/2015  . PPD Test 10/10/2011  . Pneumococcal Polysaccharide-23 02/10/2004   Pertinent  Health Maintenance Due  Topic Date Due  . DEXA SCAN  01/28/1984  . PNA vac Low Risk Adult (2 of 2 - PCV13) 02/09/2005  . INFLUENZA VACCINE  11/16/2015   No flowsheet data found. Functional Status Survey:   Wt Readings from Last 3 Encounters:  12/27/15 167 lb (75.8 kg)  11/23/15 167 lb (75.8 kg)  10/12/15 175 lb 1.6 oz (79.4 kg)    Vitals:   12/27/15 1604  Weight: 167 lb (75.8 kg)   Body mass index is 32.61 kg/m. Physical Exam  Constitutional: She appears well-developed and well-nourished. No distress.  HENT:  Head: Normocephalic and atraumatic.  Mouth/Throat: Oropharynx is clear and moist.  HOH  Eyes:  Wears glasses  Neck: Normal range of motion. Neck supple. No JVD present.  Cardiovascular: Normal rate, regular rhythm and normal heart sounds.   Pulmonary/Chest: Effort normal. No respiratory distress. She has wheezes.  Slight expiratory wheezing  Abdominal: Soft. Bowel sounds are normal. She exhibits no mass. There is no tenderness.  Musculoskeletal: She exhibits no edema or tenderness.  Lymphadenopathy:    She has no cervical adenopathy.  Neurological: She is alert.  Skin: Skin is warm and dry. No rash noted. She is not diaphoretic.  Psychiatric: She has a normal mood and affect.  She is alert to self and situation  Nursing note and vitals reviewed.   Labs reviewed:  Recent Labs  04/06/15 09/09/15  NA 140 141  K 4.2 4.2  BUN 27* 20  CREATININE 1.0 0.8   No results for input(s): AST, ALT, ALKPHOS, BILITOT, PROT, ALBUMIN in the last 8760 hours.  Recent Labs  09/09/15 11/16/15 0516  WBC 5.1 11.8  HGB 13.1 14.6  HCT 44 44  PLT 252 256   Lab Results  Component Value Date   TSH 1.27 04/06/2015   No results found for: HGBA1C Lab Results  Component Value Date   CHOL 171 08/07/2013    HDL 42 08/07/2013   LDLCALC 109 08/07/2013   TRIG 102 08/07/2013    Significant Diagnostic Results in last 30 days:  No results found.  Assessment/Plan 1. Essential hypertension -Stable -Continue Metoprolol, lasix and potassium  2. Slow transit constipation -Improved -Continue Senokot daily and Miralax prn  3. Right rotator cuff tendonitis                    -Improved ROM following joint injection in July -Continue Tylenol and Ultram prn  4. Memory loss or impairment -  Progressive dementia -would not benefit at this point  5. Insomnia -Improved -Continue Melatonin  6. Dysphagia -No recent aspiration events -Continue D2 ground nectar thickened diet   Cindi Carbon, ANP [facility] [phone]

## 2016-01-24 ENCOUNTER — Encounter: Payer: Self-pay | Admitting: Student

## 2016-01-24 DIAGNOSIS — I10 Essential (primary) hypertension: Secondary | ICD-10-CM

## 2016-01-24 DIAGNOSIS — R1319 Other dysphagia: Secondary | ICD-10-CM

## 2016-01-24 DIAGNOSIS — J189 Pneumonia, unspecified organism: Secondary | ICD-10-CM

## 2016-01-24 DIAGNOSIS — R21 Rash and other nonspecific skin eruption: Secondary | ICD-10-CM

## 2016-01-24 DIAGNOSIS — M170 Bilateral primary osteoarthritis of knee: Secondary | ICD-10-CM

## 2016-01-24 DIAGNOSIS — R1310 Dysphagia, unspecified: Secondary | ICD-10-CM

## 2016-01-24 DIAGNOSIS — R32 Unspecified urinary incontinence: Secondary | ICD-10-CM

## 2016-01-24 DIAGNOSIS — F411 Generalized anxiety disorder: Secondary | ICD-10-CM

## 2016-01-24 NOTE — Progress Notes (Signed)
Patient ID: [name], female   DOB: [birth year], 82 y.o.   MRN: [id number]  Location:   [facility]   Place of Service:  SNF (31) Provider:   Cindi Carbon, ANP [facility] [phone]   Hollace Kinnier, DO  Patient Care Team: Gayland Curry, DO as PCP - General (Geriatric Medicine) [facility] Richmond Campbell, MD as Consulting Physician (Gastroenterology) Melina Schools, MD as Consulting Physician (Orthopedic Surgery)  Extended Emergency Contact Information Primary Emergency Contact: [facility] Phone: [phone] Relation: None Secondary Emergency Contact: [facility] Address: [address]          [place] Home Phone: [phone] Relation: None  Code Status:  DNR Goals of care: Advanced Directive information Advanced Directives 11/23/2015  Does patient have an advance directive? Yes  Type of Advance Directive Out of facility DNR (pink MOST or yellow form);Living will;Healthcare Power of Attorney  Copy of advanced directive(s) in chart? Yes  Pre-existing out of facility DNR order (yellow form or pink MOST form) Yellow form placed in chart (order not valid for inpatient use)  Some encounter information is confidential and restricted. Go to Review Flowsheets activity to see all data.     Chief Complaint  Patient presents with  . Medical Management of Chronic Issues    HPI:  Pt is a 82 y.o. female seen today for medical management of chronic diseases. She resides in skilled care due to memory loss and immobility.   1. Esophageal dysphagia Staff states that she coughs with meals. In August ST changed her diet to D2 ground- thick nectar. She denies choking, difficulty chewing food or trouble swallowing.   2. HCAP (healthcare-associated pneumonia) She was treated for pneumonia on 8/1 and finished a course of Levaquin. She has not had a repeat CXR. She presented today with expiratory wheezes in all fields.   3.  Essential hypertension Her BP has been stable. Ranges have been from 144-66/73-80. She takes Toprol, Lasix and K-Dur for HTN. She wears compression stockings  4. Rash and nonspecific skin eruption She reports itching near rectum. She has a reddened area on buttocks near rectum with no skin breakdown.   5. Urinary incontinence, unspecified type She denies dysuria,   6. Anxiety state She takes Ativan .5 mg tid and prn. She has not needed any prn Ativan.   7. Primary osteoarthritis of both knees She denies pain in knees. She takes tylenol and ultram for knee pain. She is not ambulatory and propels herself in her wheelchair.    Past Medical History:  Diagnosis Date  . Abnormality of gait 2007  . Allergic rhinitis 07/23/2012  . Anemia, unspecified 2005  . Anxiety state, unspecified 2005  . Basal cell carcinoma, face 03/20/2013  . Closed fracture of sternum 2006  . Closed fracture of two ribs 2013   left 5,6th  . Depression   . Depression with anxiety   . Depressive disorder, not elsewhere classified 2005  . Disturbance of salivary secretion 2007  . Dysphagia    with chronic aspiration  . External hemorrhoids without mention of complication 0000000  . GERD (gastroesophageal reflux disease) 2005  . Hyperglycemia   . Hypertonicity of bladder 2006  . Hyponatremia   . Insomnia, unspecified 2005  . Malignant neoplasm of breast (female), unspecified site 78  . Neoplasm of unspecified nature of breast 2000  . Osteoarthrosis, unspecified whether generalized or localized, unspecified site 2005  . Other and unspecified hyperlipidemia 2005  . Other  seborrheic dermatitis 2007  . Pain in joint, shoulder region 2008  . Pain in joint, shoulder region 10/21/2012  . Senile cataract, unspecified 2005  . Senile osteoporosis 2000  . Subdural hemorrhage ([place]) 2007  . Unspecified essential hypertension 2005  . Unspecified malignant neoplasm of skin of other and unspecified parts of face 2012  .  Unspecified urinary incontinence 2005  . Unspecified venous (peripheral) insufficiency 2001  . Urinary tract infection, site not specified 2013  . Vertebral fracture, closed 2006  . Vitamin D deficiency 2009   Past Surgical History:  Procedure Laterality Date  . ABDOMINAL HYSTERECTOMY    . APPENDECTOMY    . BREAST SURGERY Right    Mastectomy  . CYST EXCISION Left 2011   Calcified cyst L shin  . ESOPHAGOGASTRODUODENOSCOPY ENDOSCOPY  07/11/2013   Dr. Watt Climes hiatus hernia  . INTRAMEDULLARY (IM) NAIL INTERTROCHANTERIC Right 02/15/2014   Procedure: INTRAMEDULLARY (IM) NAIL INTERTROCHANTRIC;  Surgeon: Melina Schools, MD;  Location: [facility];  Service: Orthopedics;  Laterality: Right;  . JOINT REPLACEMENT Left    Knee  . MOHS SURGERY  2010   Nose    Allergies  Allergen Reactions  . Other     "Epidural medication" per NH record  . Sulfa Antibiotics       Medication List       Accurate as of 01/24/16  2:47 PM. Always use your most recent med list.          acetaminophen 325 MG tablet Commonly known as:  TYLENOL Take 650 mg by mouth 2 (two) times daily. May have additional 650mg  daily as needed for pain relief   carboxymethylcellulose 0.5 % Soln Commonly known as:  REFRESH PLUS Place 2 drops into both eyes 2 (two) times daily as needed.   DULoxetine 60 MG capsule Commonly known as:  CYMBALTA Take 60 mg by mouth daily.   furosemide 20 MG tablet Commonly known as:  LASIX Take 20 mg by mouth daily.   hydrocortisone cream 1 % Apply 1 application topically. Apply as needed for itching   ipratropium 0.06 % nasal spray Commonly known as:  ATROVENT Place 2 sprays into both nostrils 3 (three) times daily.   ipratropium-albuterol 0.5-2.5 (3) MG/3ML Soln Commonly known as:  DUONEB Take 3 mLs by nebulization every 6 (six) hours as needed (wheezing).   loratadine 10 MG tablet Commonly known as:  CLARITIN Take 10 mg by mouth daily.   LORazepam 0.5 MG tablet Commonly known as:   ATIVAN Take 0.5 mg by mouth 2 (two) times daily as needed (restlessness).   LORazepam 0.5 MG tablet Commonly known as:  ATIVAN Take one tablet by mouth three times daily   Melatonin 5 MG Tabs Take 1 tablet by mouth at bedtime. For insomnia   metoprolol succinate 25 MG 24 hr tablet Commonly known as:  TOPROL-XL Take 12.5 mg by mouth at bedtime. For HTN   omeprazole 20 MG capsule Commonly known as:  PRILOSEC Take 20 mg by mouth 2 (two) times daily before a meal.   polyethylene glycol packet Commonly known as:  MIRALAX / GLYCOLAX Take 17 g by mouth daily as needed.   potassium chloride 10 MEQ tablet Commonly known as:  K-DUR,KLOR-CON Take 10 mEq by mouth daily.   sennosides-docusate sodium 8.6-50 MG tablet Commonly known as:  SENOKOT-S Take 2 tablets by mouth at bedtime. For constipation   SODIUM FLUORIDE (DENTAL RINSE) 0.02 % Soln Use as directed 15 mLs in the mouth or throat 4 (  four) times daily -  before meals and at bedtime.   traMADol 50 MG tablet Commonly known as:  ULTRAM Take 50 mg by mouth 3 (three) times daily as needed.       Review of Systems  Constitutional: Negative for activity change, appetite change, chills, fatigue and fever.  HENT: Negative for congestion, sneezing, sore throat and trouble swallowing.   Eyes: Negative for discharge and itching.  Respiratory: Positive for cough and wheezing. Negative for apnea, choking and shortness of breath.   Cardiovascular: Negative for chest pain, palpitations and leg swelling.  Gastrointestinal: Negative for abdominal pain, constipation, diarrhea, nausea and vomiting.  Genitourinary: Negative for dysuria, flank pain and frequency.  Musculoskeletal: Positive for arthralgias. Negative for joint swelling, neck pain and neck stiffness.  Skin: Positive for rash.  Neurological: Negative for dizziness, weakness, numbness and headaches.  Psychiatric/Behavioral: Positive for confusion. Negative for agitation, behavioral  problems, dysphoric mood, hallucinations, self-injury, sleep disturbance and suicidal ideas. The patient is nervous/anxious. The patient is not hyperactive.     Immunization History  Administered Date(s) Administered  . Influenza Whole 01/24/2012, 01/29/2013  . Influenza-Unspecified 01/20/2014, 02/05/2015  . PPD Test 10/10/2011  . Pneumococcal Polysaccharide-23 02/10/2004   Pertinent  Health Maintenance Due  Topic Date Due  . DEXA SCAN  01/28/1984  . PNA vac Low Risk Adult (2 of 2 - PCV13) 02/09/2005  . INFLUENZA VACCINE  11/16/2015   No flowsheet data found. Functional Status Survey:   Wt Readings from Last 3 Encounters:  01/24/16 168 lb 6.4 oz (76.4 kg)  12/27/15 167 lb (75.8 kg)  11/23/15 167 lb (75.8 kg)    Vitals:   01/24/16 1427  BP: (!) 166/78  Pulse: 83  Resp: 18  Temp: 98.1 F (36.7 C)  SpO2: 93%  Weight: 168 lb 6.4 oz (76.4 kg)   Body mass index is 32.89 kg/m. Physical Exam  Constitutional: She appears well-developed and well-nourished. No distress.  HENT:  Head: Normocephalic and atraumatic.  Mouth/Throat: Oropharynx is clear and moist.  HOH  Eyes: Right eye exhibits no discharge. Left eye exhibits no discharge.  Wears glasses  Neck: Normal range of motion. Neck supple. No JVD present.  Cardiovascular: Normal rate, regular rhythm and normal heart sounds.  Exam reveals no gallop and no friction rub.   No murmur heard. Pulmonary/Chest: Effort normal. No respiratory distress. She has wheezes.  Expiratory wheezing in all lobes  Abdominal: Soft. Bowel sounds are normal. She exhibits no mass. There is no tenderness.  Genitourinary:  Genitourinary Comments: Erythematous area between buttocks near rectum. Skin intact.   Musculoskeletal: She exhibits no edema or tenderness.  Lymphadenopathy:    She has no cervical adenopathy.  Neurological: She is alert.  Skin: Skin is warm and dry. Rash noted. She is not diaphoretic.  Psychiatric: She has a normal mood and  affect.  She is alert to self and situation  Nursing note and vitals reviewed.   Labs reviewed:  Recent Labs  04/06/15 09/09/15  NA 140 141  K 4.2 4.2  BUN 27* 20  CREATININE 1.0 0.8   No results for input(s): AST, ALT, ALKPHOS, BILITOT, PROT, ALBUMIN in the last 8760 hours.  Recent Labs  09/09/15 11/16/15 0516  WBC 5.1 11.8  HGB 13.1 14.6  HCT 44 44  PLT 252 256   Lab Results  Component Value Date   TSH 1.27 04/06/2015   No results found for: HGBA1C Lab Results  Component Value Date   CHOL 171 08/07/2013  HDL 42 08/07/2013   LDLCALC 109 08/07/2013   TRIG 102 08/07/2013    Significant Diagnostic Results in last 30 days:  No results found.  Assessment/Plan  1. Esophageal dysphagia -Continue D2 soft mechanical nectar thick diet.   2. HCAP (healthcare-associated pneumonia) -Pt has had coughing episodes while eating meals.  - 2 view CXR ordered to follow up on resolution of pna  3. Essential hypertension -Stable -Continue Metoprolol, lasix and potassium  4. Rash and nonspecific skin eruption -Start Nystatin cream to buttocks bid x 14 days. No dressing.   5. Urinary incontinence, unspecified type -No evidence of UTI at this time.  -Continue peri-care when changing patient  6. Anxiety state - Unchanged - Continue Ativan bid and prn  7. Primary osteoarthritis of both knees -Stable -Continue Tylenol and Ultram for pain.   Cindi Carbon, ANP [facility] [phone]

## 2016-01-26 ENCOUNTER — Encounter: Payer: Self-pay | Admitting: Adult Health

## 2016-01-26 ENCOUNTER — Non-Acute Institutional Stay (SKILLED_NURSING_FACILITY): Payer: Medicare Other | Admitting: Adult Health

## 2016-01-26 DIAGNOSIS — R32 Unspecified urinary incontinence: Secondary | ICD-10-CM

## 2016-01-26 DIAGNOSIS — I10 Essential (primary) hypertension: Secondary | ICD-10-CM | POA: Diagnosis not present

## 2016-01-26 DIAGNOSIS — J189 Pneumonia, unspecified organism: Secondary | ICD-10-CM

## 2016-01-26 DIAGNOSIS — F411 Generalized anxiety disorder: Secondary | ICD-10-CM | POA: Diagnosis not present

## 2016-01-26 DIAGNOSIS — R1310 Dysphagia, unspecified: Secondary | ICD-10-CM | POA: Diagnosis not present

## 2016-01-26 DIAGNOSIS — R1319 Other dysphagia: Secondary | ICD-10-CM

## 2016-01-26 DIAGNOSIS — R21 Rash and other nonspecific skin eruption: Secondary | ICD-10-CM

## 2016-01-26 DIAGNOSIS — M170 Bilateral primary osteoarthritis of knee: Secondary | ICD-10-CM | POA: Diagnosis not present

## 2016-01-26 NOTE — Progress Notes (Signed)
Patient ID: [name], female   DOB: 09/05/1916, 82 y.o.   MRN: [id number]  Location:   [facility]   Place of Service:  SNF (31) Provider:   Cindi Carbon, ANP [facility] [phone]   Hollace Kinnier, DO  Patient Care Team: Gayland Curry, DO as PCP - General (Geriatric Medicine) [facility] [place], MD as Consulting Physician (Gastroenterology) Melina Schools, MD as Consulting Physician (Orthopedic Surgery)  Extended Emergency Contact Information Primary Emergency Contact: [facility] Phone: [phone] Relation: None Secondary Emergency Contact: [facility] Address: [address]          [place] Home Phone: [phone] Relation: None  Code Status:  DNR Goals of care: Advanced Directive information Advanced Directives 11/23/2015  Does patient have an advance directive? Yes  Type of Advance Directive Out of facility DNR (pink MOST or yellow form);Living will;Healthcare Power of Attorney  Copy of advanced directive(s) in chart? Yes  Pre-existing out of facility DNR order (yellow form or pink MOST form) Yellow form placed in chart (order not valid for inpatient use)  Some encounter information is confidential and restricted. Go to Review Flowsheets activity to see all data.     Chief Complaint  Patient presents with  . Medical Management of Chronic Issues    HPI:  Pt is a 82 y.o. female seen today for medical management of chronic diseases. She resides in skilled care due to memory loss and immobility.   1. Esophageal dysphagia Staff states that she coughs with meals. In August ST changed her diet to D2 ground- thick nectar. She denies choking, difficulty chewing food or trouble swallowing.   2. HCAP (healthcare-associated pneumonia) She was treated for pneumonia on 8/1 and finished a course of Levaquin. She has not had a repeat CXR. She presented today with expiratory wheezes in all fields.   3.  Essential hypertension Her BP has been stable. Ranges have been from 144-66/73-80. She takes Toprol, Lasix and K-Dur for HTN. She wears compression stockings  4. Rash and nonspecific skin eruption She reports itching near rectum. She has a reddened area on buttocks near rectum with no skin breakdown.   5. Urinary incontinence, unspecified type She denies dysuria,   6. Anxiety state She takes Ativan .5 mg tid and prn. She has not needed any prn Ativan.   7. Primary osteoarthritis of both knees She denies pain in knees. She takes tylenol and ultram for knee pain. She is not ambulatory and propels herself in her wheelchair.    Past Medical History:  Diagnosis Date  . Abnormality of gait 2007  . Allergic rhinitis 07/23/2012  . Anemia, unspecified 2005  . Anxiety state, unspecified 2005  . Basal cell carcinoma, face 03/20/2013  . Closed fracture of sternum 2006  . Closed fracture of two ribs 2013   left 5,6th  . Depression   . Depression with anxiety   . Depressive disorder, not elsewhere classified 2005  . Disturbance of salivary secretion 2007  . Dysphagia    with chronic aspiration  . External hemorrhoids without mention of complication 0000000  . GERD (gastroesophageal reflux disease) 2005  . Hyperglycemia   . Hypertonicity of bladder 2006  . Hyponatremia   . Insomnia, unspecified 2005  . Malignant neoplasm of breast (female), unspecified site 35  . Neoplasm of unspecified nature of breast 2000  . Osteoarthrosis, unspecified whether generalized or localized, unspecified site 2005  . Other and unspecified hyperlipidemia 2005  . Other  seborrheic dermatitis 2007  . Pain in joint, shoulder region 2008  . Pain in joint, shoulder region 10/21/2012  . Senile cataract, unspecified 2005  . Senile osteoporosis 2000  . Subdural hemorrhage ([place]) 2007  . Unspecified essential hypertension 2005  . Unspecified malignant neoplasm of skin of other and unspecified parts of face 2012  .  Unspecified urinary incontinence 2005  . Unspecified venous (peripheral) insufficiency 2001  . Urinary tract infection, site not specified 2013  . Vertebral fracture, closed 2006  . Vitamin D deficiency 2009   Past Surgical History:  Procedure Laterality Date  . ABDOMINAL HYSTERECTOMY    . APPENDECTOMY    . BREAST SURGERY Right    Mastectomy  . CYST EXCISION Left 2011   Calcified cyst L shin  . ESOPHAGOGASTRODUODENOSCOPY ENDOSCOPY  07/11/2013   Dr. Watt Climes hiatus hernia  . INTRAMEDULLARY (IM) NAIL INTERTROCHANTERIC Right 02/15/2014   Procedure: INTRAMEDULLARY (IM) NAIL INTERTROCHANTRIC;  Surgeon: Melina Schools, MD;  Location: [facility];  Service: Orthopedics;  Laterality: Right;  . JOINT REPLACEMENT Left    Knee  . MOHS SURGERY  2010   Nose    Allergies  Allergen Reactions  . Other     "Epidural medication" per NH record  . Sulfa Antibiotics       Medication List       Accurate as of 01/26/16  2:44 PM. Always use your most recent med list.          acetaminophen 325 MG tablet Commonly known as:  TYLENOL Take 650 mg by mouth 2 (two) times daily. May have additional 650mg  daily as needed for pain relief   carboxymethylcellulose 0.5 % Soln Commonly known as:  REFRESH PLUS Place 2 drops into both eyes 2 (two) times daily as needed.   DULoxetine 60 MG capsule Commonly known as:  CYMBALTA Take 60 mg by mouth daily.   furosemide 20 MG tablet Commonly known as:  LASIX Take 20 mg by mouth daily.   hydrocortisone cream 1 % Apply 1 application topically. Apply as needed for itching   ipratropium 0.06 % nasal spray Commonly known as:  ATROVENT Place 2 sprays into both nostrils 3 (three) times daily.   ipratropium-albuterol 0.5-2.5 (3) MG/3ML Soln Commonly known as:  DUONEB Take 3 mLs by nebulization every 6 (six) hours as needed (wheezing).   loratadine 10 MG tablet Commonly known as:  CLARITIN Take 10 mg by mouth daily.   LORazepam 0.5 MG tablet Commonly known  as:  ATIVAN Take 0.5 mg by mouth 2 (two) times daily as needed (restlessness).   LORazepam 0.5 MG tablet Commonly known as:  ATIVAN Take one tablet by mouth three times daily   Melatonin 5 MG Tabs Take 1 tablet by mouth at bedtime. For insomnia   metoprolol succinate 25 MG 24 hr tablet Commonly known as:  TOPROL-XL Take 12.5 mg by mouth at bedtime. For HTN   omeprazole 20 MG capsule Commonly known as:  PRILOSEC Take 20 mg by mouth 2 (two) times daily before a meal.   polyethylene glycol packet Commonly known as:  MIRALAX / GLYCOLAX Take 17 g by mouth daily as needed.   potassium chloride 10 MEQ tablet Commonly known as:  K-DUR,KLOR-CON Take 10 mEq by mouth daily.   sennosides-docusate sodium 8.6-50 MG tablet Commonly known as:  SENOKOT-S Take 2 tablets by mouth at bedtime. For constipation   SODIUM FLUORIDE (DENTAL RINSE) 0.02 % Soln Use as directed 15 mLs in the mouth or throat 4 (  four) times daily -  before meals and at bedtime.   traMADol 50 MG tablet Commonly known as:  ULTRAM Take 50 mg by mouth 3 (three) times daily as needed.       Review of Systems  Constitutional: Negative for activity change, appetite change, chills, fatigue and fever.  HENT: Negative for congestion, sneezing, sore throat and trouble swallowing.   Eyes: Negative for discharge and itching.  Respiratory: Positive for cough and wheezing. Negative for apnea, choking and shortness of breath.   Cardiovascular: Negative for chest pain, palpitations and leg swelling.  Gastrointestinal: Negative for abdominal pain, constipation, diarrhea, nausea and vomiting.  Genitourinary: Negative for dysuria, flank pain and frequency.  Musculoskeletal: Positive for arthralgias. Negative for joint swelling, neck pain and neck stiffness.  Skin: Positive for rash.  Neurological: Negative for dizziness, weakness, numbness and headaches.  Psychiatric/Behavioral: Positive for confusion. Negative for agitation,  behavioral problems, dysphoric mood, hallucinations, self-injury, sleep disturbance and suicidal ideas. The patient is nervous/anxious. The patient is not hyperactive.     Immunization History  Administered Date(s) Administered  . Influenza Whole 01/24/2012, 01/29/2013  . Influenza-Unspecified 01/20/2014, 02/05/2015  . PPD Test 10/10/2011  . Pneumococcal Polysaccharide-23 02/10/2004   Pertinent  Health Maintenance Due  Topic Date Due  . DEXA SCAN  01/28/1984  . PNA vac Low Risk Adult (2 of 2 - PCV13) 02/09/2005  . INFLUENZA VACCINE  11/16/2015   No flowsheet data found. Functional Status Survey:   Wt Readings from Last 3 Encounters:  01/24/16 168 lb 6.4 oz (76.4 kg)  12/27/15 167 lb (75.8 kg)  11/23/15 167 lb (75.8 kg)  166/78 83 18 98.1 93% 168 lbs Physical Exam  Constitutional: She appears well-developed and well-nourished. No distress.  HENT:  Head: Normocephalic and atraumatic.  Mouth/Throat: Oropharynx is clear and moist.  HOH  Eyes: Right eye exhibits no discharge. Left eye exhibits no discharge.  Wears glasses  Neck: Normal range of motion. Neck supple. No JVD present.  Cardiovascular: Normal rate, regular rhythm and normal heart sounds.  Exam reveals no gallop and no friction rub.   No murmur heard. Pulmonary/Chest: Effort normal. No respiratory distress. She has wheezes.  Expiratory wheezing in all lobes  Abdominal: Soft. Bowel sounds are normal. She exhibits no mass. There is no tenderness.  Genitourinary:  Genitourinary Comments: Erythematous area between buttocks near rectum. Skin intact.   Musculoskeletal: She exhibits no edema or tenderness.  Lymphadenopathy:    She has no cervical adenopathy.  Neurological: She is alert.  Skin: Skin is warm and dry. Rash noted. She is not diaphoretic.  Psychiatric: She has a normal mood and affect.  She is alert to self and situation  Nursing note and vitals reviewed.   Labs reviewed:  Recent Labs  04/06/15 09/09/15   NA 140 141  K 4.2 4.2  BUN 27* 20  CREATININE 1.0 0.8   No results for input(s): AST, ALT, ALKPHOS, BILITOT, PROT, ALBUMIN in the last 8760 hours.  Recent Labs  09/09/15 11/16/15 0516  WBC 5.1 11.8  HGB 13.1 14.6  HCT 44 44  PLT 252 256   Lab Results  Component Value Date   TSH 1.27 04/06/2015   No results found for: HGBA1C Lab Results  Component Value Date   CHOL 171 08/07/2013   HDL 42 08/07/2013   LDLCALC 109 08/07/2013   TRIG 102 08/07/2013    Significant Diagnostic Results in last 30 days:  No results found.  Assessment/Plan  1. Esophageal dysphagia -  Continue D2 soft mechanical nectar thick diet.   2. HCAP (healthcare-associated pneumonia) -Pt has had coughing episodes while eating meals.  - 2 view CXR ordered to follow up on resolution of pna  3. Essential hypertension -Stable -Continue Metoprolol, lasix and potassium  4. Rash and nonspecific skin eruption -Start Nystatin cream to buttocks bid x 14 days. No dressing.   5. Urinary incontinence, unspecified type -No evidence of UTI at this time.  -Continue peri-care when changing patient  6. Anxiety state - Unchanged - Continue Ativan bid and prn  7. Primary osteoarthritis of both knees -Stable -Continue Tylenol and Ultram for pain.   Cindi Carbon, ANP [facility] [phone]

## 2016-01-26 NOTE — Progress Notes (Signed)
This encounter was created in error - please disregard.

## 2016-02-10 ENCOUNTER — Non-Acute Institutional Stay (SKILLED_NURSING_FACILITY): Payer: Medicare Other | Admitting: Adult Health

## 2016-02-10 ENCOUNTER — Encounter: Payer: Self-pay | Admitting: Adult Health

## 2016-02-10 DIAGNOSIS — H539 Unspecified visual disturbance: Secondary | ICD-10-CM

## 2016-02-10 DIAGNOSIS — R410 Disorientation, unspecified: Secondary | ICD-10-CM

## 2016-02-10 DIAGNOSIS — R1312 Dysphagia, oropharyngeal phase: Secondary | ICD-10-CM

## 2016-02-10 LAB — HEPATIC FUNCTION PANEL
ALT: 31 U/L (ref 7–35)
AST: 29 U/L (ref 13–35)
Alkaline Phosphatase: 74 U/L (ref 25–125)
BILIRUBIN, TOTAL: 0.3 mg/dL

## 2016-02-10 LAB — CBC AND DIFFERENTIAL
HEMATOCRIT: 47 % — AB (ref 36–46)
HEMOGLOBIN: 15.2 g/dL (ref 12.0–16.0)
Platelets: 233 10*3/uL (ref 150–399)
WBC: 5.8 10*3/mL

## 2016-02-10 LAB — BASIC METABOLIC PANEL
BUN: 19 mg/dL (ref 4–21)
Creatinine: 1 mg/dL (ref 0.5–1.1)
GLUCOSE: 105 mg/dL
Potassium: 5.3 mmol/L (ref 3.4–5.3)
Sodium: 142 mmol/L (ref 137–147)

## 2016-02-10 NOTE — Progress Notes (Signed)
Patient ID: [name], female   DOB: 04/14/1917, 82 y.o.   MRN: [id number]  Location:  [place]:  SNF (31) Provider:   Cindi Carbon, ANP [place] [phone]  REED, Jonelle Sidle, DO  Patient Care Team: Gayland Curry, DO as PCP - General (Geriatric Medicine) [facility] [place] Campbell, MD as Consulting Physician (Gastroenterology) Melina Schools, MD as Consulting Physician (Orthopedic Surgery)  Extended Emergency Contact Information Primary Emergency Contact: [facility] Phone: [phone] Relation: None Secondary Emergency Contact: [facility] Address: [address]          [place]  [place] Home Phone: [phone] Relation: None  Code Status:  DNR Goals of care: Advanced Directive information Advanced Directives 11/23/2015  Does patient have an advance directive? Yes  Type of Advance Directive Out of facility DNR (pink MOST or yellow form);Living will;Healthcare Power of Attorney  Copy of advanced directive(s) in chart? Yes  Pre-existing out of facility DNR order (yellow form or pink MOST form) Yellow form placed in chart (order not valid for inpatient use)  Some encounter information is confidential and restricted. Go to Review Flowsheets activity to see all data.     Chief Complaint  Patient presents with  . Acute Visit    vision change, increased confusion    HPI:  Pt is a 82 y.o. female seen today for an acute visit for reports of increased confusion. She has baseline dementia and weakness and is a hoyer lift but generally is pleasant, able to follow commands and uses her call button when necessary. Now the staff reports that she is screaming when help is needed and seems slightly sleepy (noted on 10/25).  Staff report her urine is concentrated but no other associated symptoms. She has a hx of dysphagia and was treated for pna in August of 2017 but follow xray on 10/10  showed resolution.  Staff deny cough, congestion, low 02 sats, fever, etc.   There are also reports that she is having trouble with her vision. The resident denies this but staff state that she has trouble grabbing her toast on the plate.    Past Medical History:  Diagnosis Date  . Abnormality of gait 2007  . Allergic rhinitis 07/23/2012  . Anemia, unspecified 2005  . Anxiety state, unspecified 2005  . Basal cell carcinoma, face 03/20/2013  . Closed fracture of sternum 2006  . Closed fracture of two ribs 2013   left 5,6th  . Depression   . Depression with anxiety   . Depressive disorder, not elsewhere classified 2005  . Disturbance of salivary secretion 2007  . Dysphagia    with chronic aspiration  . External hemorrhoids without mention of complication 0000000  . GERD (gastroesophageal reflux disease) 2005  . Hyperglycemia   . Hypertonicity of bladder 2006  . Hyponatremia   . Insomnia, unspecified 2005  . Malignant neoplasm of breast (female), unspecified site 24  . Neoplasm of unspecified nature of breast 2000  . Osteoarthrosis, unspecified whether generalized or localized, unspecified site 2005  . Other and unspecified hyperlipidemia 2005  . Other seborrheic dermatitis 2007  . Pain in joint, shoulder region 2008  . Pain in joint, shoulder region 10/21/2012  . Senile cataract, unspecified 2005  . Senile osteoporosis 2000  . Subdural hemorrhage ([place]) 2007  . Unspecified essential hypertension 2005  . Unspecified malignant neoplasm of skin of other and unspecified parts of face 2012  . Unspecified urinary incontinence 2005  .  Unspecified venous (peripheral) insufficiency 2001  . Urinary tract infection, site not specified 2013  . Vertebral fracture, closed 2006  . Vitamin D deficiency 2009   Past Surgical History:  Procedure Laterality Date  . ABDOMINAL HYSTERECTOMY    . APPENDECTOMY    . BREAST SURGERY Right    Mastectomy  . CYST EXCISION Left 2011   Calcified cyst L shin   . ESOPHAGOGASTRODUODENOSCOPY ENDOSCOPY  07/11/2013   Dr. Watt Climes hiatus hernia  . INTRAMEDULLARY (IM) NAIL INTERTROCHANTERIC Right 02/15/2014   Procedure: INTRAMEDULLARY (IM) NAIL INTERTROCHANTRIC;  Surgeon: Melina Schools, MD;  Location: [facility];  Service: Orthopedics;  Laterality: Right;  . JOINT REPLACEMENT Left    Knee  . MOHS SURGERY  2010   Nose    Allergies  Allergen Reactions  . Other     "Epidural medication" per NH record  . Sulfa Antibiotics       Medication List       Accurate as of 02/10/16 10:28 AM. Always use your most recent med list.          acetaminophen 325 MG tablet Commonly known as:  TYLENOL Take 650 mg by mouth 2 (two) times daily. May have additional 650mg  daily as needed for pain relief   carboxymethylcellulose 0.5 % Soln Commonly known as:  REFRESH PLUS Place 2 drops into both eyes 2 (two) times daily as needed.   DULoxetine 60 MG capsule Commonly known as:  CYMBALTA Take 60 mg by mouth daily.   furosemide 20 MG tablet Commonly known as:  LASIX Take 20 mg by mouth daily.   hydrocortisone cream 1 % Apply 1 application topically. Apply as needed for itching   ipratropium 0.06 % nasal spray Commonly known as:  ATROVENT Place 2 sprays into both nostrils 3 (three) times daily.   ipratropium-albuterol 0.5-2.5 (3) MG/3ML Soln Commonly known as:  DUONEB Take 3 mLs by nebulization every 6 (six) hours as needed (wheezing).   loratadine 10 MG tablet Commonly known as:  CLARITIN Take 10 mg by mouth daily.   LORazepam 0.5 MG tablet Commonly known as:  ATIVAN Take 0.5 mg by mouth 2 (two) times daily as needed (restlessness).   LORazepam 0.5 MG tablet Commonly known as:  ATIVAN Take one tablet by mouth three times daily   Melatonin 5 MG Tabs Take 1 tablet by mouth at bedtime. For insomnia   metoprolol succinate 25 MG 24 hr tablet Commonly known as:  TOPROL-XL Take 12.5 mg by mouth at bedtime. For HTN   omeprazole 20 MG capsule Commonly  known as:  PRILOSEC Take 20 mg by mouth 2 (two) times daily before a meal.   polyethylene glycol packet Commonly known as:  MIRALAX / GLYCOLAX Take 17 g by mouth daily as needed.   potassium chloride 10 MEQ tablet Commonly known as:  K-DUR,KLOR-CON Take 10 mEq by mouth daily.   sennosides-docusate sodium 8.6-50 MG tablet Commonly known as:  SENOKOT-S Take 2 tablets by mouth at bedtime. For constipation   SODIUM FLUORIDE (DENTAL RINSE) 0.02 % Soln Use as directed 15 mLs in the mouth or throat 4 (four) times daily -  before meals and at bedtime.   traMADol 50 MG tablet Commonly known as:  ULTRAM Take 50 mg by mouth 3 (three) times daily as needed.       Review of Systems  Constitutional: Negative for activity change, appetite change, chills, diaphoresis, fatigue and fever.  HENT: Positive for trouble swallowing. Negative for congestion, sinus pressure and  sore throat.   Eyes: Positive for visual disturbance (trouble seeing items on her tray). Negative for photophobia, pain, discharge, redness and itching.       No eye pain   Gastrointestinal: Negative for abdominal distention, abdominal pain, blood in stool, constipation, nausea and vomiting.  Genitourinary: Negative for difficulty urinating, dysuria, flank pain and frequency.  Musculoskeletal: Positive for arthralgias and gait problem.  Neurological: Negative for dizziness, tremors, syncope, facial asymmetry, speech difficulty, weakness, light-headedness and numbness.  Psychiatric/Behavioral: Positive for behavioral problems and confusion. Negative for agitation.    Immunization History  Administered Date(s) Administered  . Influenza Whole 01/24/2012, 01/29/2013  . Influenza-Unspecified 01/20/2014, 02/05/2015  . PPD Test 10/10/2011  . Pneumococcal Polysaccharide-23 02/10/2004   Pertinent  Health Maintenance Due  Topic Date Due  . DEXA SCAN  01/28/1984  . PNA vac Low Risk Adult (2 of 2 - PCV13) 02/09/2005  . INFLUENZA  VACCINE  11/16/2015   No flowsheet data found. Functional Status Survey:    There were no vitals filed for this visit. There is no height or weight on file to calculate BMI. Physical Exam  Constitutional: No distress.  HENT:  Head: Normocephalic and atraumatic.  Nose: Nose normal.  Mouth/Throat: Oropharynx is clear and moist. No oropharyngeal exudate.  Very dry mouth  Eyes: Conjunctivae and EOM are normal. Pupils are equal, round, and reactive to light. Right eye exhibits no discharge. Left eye exhibits no discharge.  Neck: Normal range of motion. Neck supple. No JVD present. No thyromegaly present.  Cardiovascular: Normal rate and regular rhythm.   No murmur heard. No edema  Pulmonary/Chest: She has rales (bilat, worse on the RLL).  Abdominal: Soft. Bowel sounds are normal. She exhibits no distension. There is no tenderness.  Neurological: She is alert. She has normal reflexes. No cranial nerve deficit.  Oriented to self, place, not time.  Able to follow commands. Difficulty performing finger to nose test. Able to touch her nose but had trouble seeing my finger. Identified fingers held up in front of her but had difficulty doing so.    Skin: Skin is warm and dry. She is not diaphoretic.  Psychiatric: She has a normal mood and affect.  Nursing note and vitals reviewed.   Labs reviewed:  Recent Labs  04/06/15 09/09/15  NA 140 141  K 4.2 4.2  BUN 27* 20  CREATININE 1.0 0.8   No results for input(s): AST, ALT, ALKPHOS, BILITOT, PROT, ALBUMIN in the last 8760 hours.  Recent Labs  09/09/15 11/16/15 0516  WBC 5.1 11.8  HGB 13.1 14.6  HCT 44 44  PLT 252 256   Lab Results  Component Value Date   TSH 1.27 04/06/2015   No results found for: HGBA1C Lab Results  Component Value Date   CHOL 171 08/07/2013   HDL 42 08/07/2013   LDLCALC 109 08/07/2013   TRIG 102 08/07/2013    Significant Diagnostic Results in last 30 days:  No results found.  Assessment/Plan  1.  Delirium Her mouth appears dry and she may be dehydrated. I have asked the staff to encourage fluids q 2 hrs while awake while we wait on labs to return.  CBC BMP Lung sounds are abnormal, worse than usual.  Check CXR.  Check UA C and S.  2. Oropharyngeal dysphagia Continue D2 NTL, at risk for asp with recent bout of pna Continue asp prec.   3. Visual disturbance Seems to have some difficulty seeing but able to identify objects and in  no pain. No focal deficit is noted. This appears to be due to an electrolyte imbalance. We will await her labs and treat accordingly.  If this is normal would refer to ophthalmology.  Would not perform any aggressive testing given her age of 29 and goals of care.    Family/ staff Communication: discussed with staff/resident  Labs/tests ordered:  CBC CMP

## 2016-02-17 ENCOUNTER — Non-Acute Institutional Stay (SKILLED_NURSING_FACILITY): Payer: Medicare Other | Admitting: Adult Health

## 2016-02-17 DIAGNOSIS — J189 Pneumonia, unspecified organism: Secondary | ICD-10-CM | POA: Diagnosis not present

## 2016-02-17 DIAGNOSIS — N3000 Acute cystitis without hematuria: Secondary | ICD-10-CM

## 2016-02-17 DIAGNOSIS — G459 Transient cerebral ischemic attack, unspecified: Secondary | ICD-10-CM

## 2016-02-21 ENCOUNTER — Non-Acute Institutional Stay (SKILLED_NURSING_FACILITY): Payer: Medicare Other | Admitting: Adult Health

## 2016-02-21 ENCOUNTER — Encounter: Payer: Self-pay | Admitting: Adult Health

## 2016-02-21 DIAGNOSIS — Z7189 Other specified counseling: Secondary | ICD-10-CM | POA: Diagnosis not present

## 2016-02-21 DIAGNOSIS — R1312 Dysphagia, oropharyngeal phase: Secondary | ICD-10-CM | POA: Diagnosis not present

## 2016-02-21 DIAGNOSIS — G459 Transient cerebral ischemic attack, unspecified: Secondary | ICD-10-CM | POA: Insufficient documentation

## 2016-02-21 DIAGNOSIS — J690 Pneumonitis due to inhalation of food and vomit: Secondary | ICD-10-CM

## 2016-02-21 DIAGNOSIS — I639 Cerebral infarction, unspecified: Secondary | ICD-10-CM

## 2016-02-21 NOTE — Progress Notes (Signed)
Patient ID: [name], female   DOB: 03-Jul-1916, 82 y.o.   MRN: [id number]  Location:  [place]:  SNF (31) Provider:   Cindi Carbon, ANP [place] [phone]  REED, Jonelle Sidle, DO  Patient Care Team: Gayland Curry, DO as PCP - General (Geriatric Medicine) Well [facility] [place] Campbell, MD as Consulting Physician (Gastroenterology) Melina Schools, MD as Consulting Physician (Orthopedic Surgery)  Extended Emergency Contact Information Primary Emergency Contact: [facility] Phone: [phone] Relation: None Secondary Emergency Contact: [facility] Address: [address]          [place]  [place] Home Phone: [phone] Relation: None  Code Status:  DNR Goals of care: Advanced Directive information Advanced Directives 02/21/2016  Does patient have an advance directive? Yes  Type of Paramedic of [place];Living will;Out of facility DNR (pink MOST or yellow form)  Copy of advanced directive(s) in chart? Yes  Pre-existing out of facility DNR order (yellow form or pink MOST form) -  Some encounter information is confidential and restricted. Go to Review Flowsheets activity to see all data.     Chief Complaint  Patient presents with  . Acute Visit    worsening cough, low grade fever    HPI:  Pt is a 82 y.o. female seen 02/17/16 for an acute visit for reports of TIA symptoms. She has baseline dementia and weakness and is a hoyer lift but generally is pleasant, able to follow commands and uses her call button when necessary. Staff reports an episode of right facial droop and speech slurring that resolved spontaneously on the night shift.  She has had decreased vision in the right eye and increased confusion for the past week.  I have spoken with her daughter who does not want aggressive care given her advanced age of 82.  She is less confused since starting an  antibiotic on 10/27 but remains with right eye vision problems.  Has a cough with meals and there is concern for aspiration. She is currently on a ground diet with NTL.   UA C and S 10/26 >100,000 colonies E.Coli sensitive to pcn, no urinary symptoms  CXR 10/27 Showed pna in the left upper lobe, started on Augmentin.   Update 02/21/2016 : worsening cough, congestion, temp, increased RR.  Sats 90-92 % on RA. Remains confused with vision problems. Coughing with meals on D2 diet, staff concerned for aspiration  Past Medical History:  Diagnosis Date  . Abnormality of gait 2007  . Allergic rhinitis 07/23/2012  . Anemia, unspecified 2005  . Anxiety state, unspecified 2005  . Basal cell carcinoma, face 03/20/2013  . Closed fracture of sternum 2006  . Closed fracture of two ribs 2013   left 5,6th  . Depression   . Depression with anxiety   . Depressive disorder, not elsewhere classified 2005  . Disturbance of salivary secretion 2007  . Dysphagia    with chronic aspiration  . External hemorrhoids without mention of complication 0000000  . GERD (gastroesophageal reflux disease) 2005  . Hyperglycemia   . Hypertonicity of bladder 2006  . Hyponatremia   . Insomnia, unspecified 2005  . Malignant neoplasm of breast (female), unspecified site 68  . Neoplasm of unspecified nature of breast 2000  . Osteoarthrosis, unspecified whether generalized or localized, unspecified site 2005  . Other and unspecified hyperlipidemia 2005  . Other seborrheic dermatitis 2007  . Pain in joint, shoulder region 2008  . Pain in  joint, shoulder region 10/21/2012  . Senile cataract, unspecified 2005  . Senile osteoporosis 2000  . Subdural hemorrhage ([place]) 2007  . Unspecified essential hypertension 2005  . Unspecified malignant neoplasm of skin of other and unspecified parts of face 2012  . Unspecified urinary incontinence 2005  . Unspecified venous (peripheral) insufficiency 2001  . Urinary tract infection, site not  specified 2013  . Vertebral fracture, closed 2006  . Vitamin D deficiency 2009   Past Surgical History:  Procedure Laterality Date  . ABDOMINAL HYSTERECTOMY    . APPENDECTOMY    . BREAST SURGERY Right    Mastectomy  . CYST EXCISION Left 2011   Calcified cyst L shin  . ESOPHAGOGASTRODUODENOSCOPY ENDOSCOPY  07/11/2013   Dr. Watt Climes hiatus hernia  . INTRAMEDULLARY (IM) NAIL INTERTROCHANTERIC Right 02/15/2014   Procedure: INTRAMEDULLARY (IM) NAIL INTERTROCHANTRIC;  Surgeon: Melina Schools, MD;  Location: [facility];  Service: Orthopedics;  Laterality: Right;  . JOINT REPLACEMENT Left    Knee  . MOHS SURGERY  2010   Nose    Allergies  Allergen Reactions  . Other     "Epidural medication" per NH record  . Sulfa Antibiotics       Medication List       Accurate as of 02/21/16  2:04 PM. Always use your most recent med list.          acetaminophen 325 MG tablet Commonly known as:  TYLENOL Take 650 mg by mouth 2 (two) times daily. May have additional 650mg  daily as needed for pain relief   aspirin EC 81 MG tablet Take 81 mg by mouth daily.   carboxymethylcellulose 0.5 % Soln Commonly known as:  REFRESH PLUS Place 2 drops into both eyes 2 (two) times daily as needed.   DULoxetine 60 MG capsule Commonly known as:  CYMBALTA Take 60 mg by mouth daily.   furosemide 20 MG tablet Commonly known as:  LASIX Take 20 mg by mouth daily.   hydrocortisone cream 1 % Apply 1 application topically. Apply as needed for itching   ipratropium 0.06 % nasal spray Commonly known as:  ATROVENT Place 2 sprays into both nostrils 3 (three) times daily.   ipratropium-albuterol 0.5-2.5 (3) MG/3ML Soln Commonly known as:  DUONEB Take 3 mLs by nebulization every 6 (six) hours as needed (wheezing).   loratadine 10 MG tablet Commonly known as:  CLARITIN Take 10 mg by mouth daily.   LORazepam 0.5 MG tablet Commonly known as:  ATIVAN Take 0.5 mg by mouth 2 (two) times daily as needed  (restlessness).   LORazepam 0.5 MG tablet Commonly known as:  ATIVAN Take one tablet by mouth three times daily   Melatonin 5 MG Tabs Take 1 tablet by mouth at bedtime. For insomnia   metoprolol succinate 25 MG 24 hr tablet Commonly known as:  TOPROL-XL Take 12.5 mg by mouth at bedtime. For HTN   omeprazole 20 MG capsule Commonly known as:  PRILOSEC Take 20 mg by mouth 2 (two) times daily before a meal.   polyethylene glycol packet Commonly known as:  MIRALAX / GLYCOLAX Take 17 g by mouth daily as needed.   potassium chloride 10 MEQ tablet Commonly known as:  K-DUR,KLOR-CON Take 10 mEq by mouth daily.   sennosides-docusate sodium 8.6-50 MG tablet Commonly known as:  SENOKOT-S Take 2 tablets by mouth at bedtime. For constipation   SODIUM FLUORIDE (DENTAL RINSE) 0.02 % Soln Use as directed 15 mLs in the mouth or throat 4 (four) times  daily -  before meals and at bedtime.   traMADol 50 MG tablet Commonly known as:  ULTRAM Take 50 mg by mouth 3 (three) times daily as needed.       Review of Systems  Constitutional: Negative for activity change, appetite change, chills, diaphoresis, fatigue and fever.  HENT: Positive for congestion, rhinorrhea, trouble swallowing and voice change. Negative for sinus pressure and sore throat.   Eyes: Positive for visual disturbance (trouble seeing items on her tray). Negative for photophobia, pain, discharge, redness and itching.       No eye pain   Respiratory: Positive for cough. Negative for shortness of breath, wheezing and stridor.   Cardiovascular: Negative for chest pain and leg swelling.  Gastrointestinal: Negative for abdominal distention, abdominal pain, blood in stool, constipation, nausea and vomiting.  Genitourinary: Negative for difficulty urinating, dysuria, flank pain and frequency.  Musculoskeletal: Positive for arthralgias and gait problem.  Neurological: Negative for dizziness, tremors, syncope, facial asymmetry, speech  difficulty, weakness, light-headedness and numbness.  Psychiatric/Behavioral: Positive for behavioral problems and confusion. Negative for agitation.    Immunization History  Administered Date(s) Administered  . Influenza Inj Mdck Quad Pf 02/03/2016  . Influenza Whole 01/24/2012, 01/29/2013  . Influenza-Unspecified 01/20/2014, 02/05/2015  . PPD Test 10/10/2011  . Pneumococcal Polysaccharide-23 02/10/2004   Pertinent  Health Maintenance Due  Topic Date Due  . DEXA SCAN  01/28/1984  . PNA vac Low Risk Adult (2 of 2 - PCV13) 02/09/2005  . INFLUENZA VACCINE  Completed   No flowsheet data found. Functional Status Survey:    Vitals:   02/21/16 1159  BP: (!) 144/80  Pulse: 82  Resp: (!) 28  Temp: 99.4 F (37.4 C)  SpO2: 90%   There is no height or weight on file to calculate BMI. Physical Exam  Constitutional: No distress.  HENT:  Head: Normocephalic and atraumatic.  Nose: Nose normal.  Mouth/Throat: Oropharynx is clear and moist. No oropharyngeal exudate.  Very dry mouth  Eyes: Conjunctivae and EOM are normal. Pupils are equal, round, and reactive to light. Right eye exhibits no discharge. Left eye exhibits no discharge.  Neck: Normal range of motion. Neck supple. No JVD present. No thyromegaly present.  Cardiovascular: Normal rate and regular rhythm.   No murmur heard. No edema  Pulmonary/Chest: She has rales (bilat, worse on the RLL).  Abdominal: Soft. Bowel sounds are normal. She exhibits no distension. There is no tenderness.  Neurological: She is alert. She has normal reflexes. No cranial nerve deficit.  Oriented to self, place, not time.  Able to follow commands. Difficulty performing finger to nose test. Able to touch her nose but had trouble seeing my finger. Identified fingers held up in front of her but had difficulty doing so.    Skin: Skin is warm and dry. She is not diaphoretic.  Psychiatric: She has a normal mood and affect.  Nursing note and vitals  reviewed.   Labs reviewed:  Recent Labs  04/06/15 09/09/15 02/10/16  NA 140 141 142  K 4.2 4.2 5.3  BUN 27* 20 19  CREATININE 1.0 0.8 1.0    Recent Labs  02/10/16  AST 29  ALT 31  ALKPHOS 74    Recent Labs  09/09/15 11/16/15 0516 02/10/16  WBC 5.1 11.8 5.8  HGB 13.1 14.6 15.2  HCT 44 44 47*  PLT 252 256 233   Lab Results  Component Value Date   TSH 1.27 04/06/2015   No results found for: HGBA1C Lab  Results  Component Value Date   CHOL 171 08/07/2013   HDL 42 08/07/2013   LDLCALC 109 08/07/2013   TRIG 102 08/07/2013    Significant Diagnostic Results in last 30 days:  No results found.  Assessment/Plan  1. Acute CVA (cerebrovascular accident) ([place]) ?? Right eye visual symptoms, previous TIA symptoms, worsening swallow function Continue aspirin 81 mg qd, no aggressive care  2. Aspiration pneumonia of left upper lobe, unspecified aspiration pneumonia type (HCC) No improvement Begin Levaquin 500 mg qd x 7 days Florastor 1 cap BID for 7 days  3. Oropharyngeal dysphagia No further down grade in diet per family request due to quality of life for resident  4. Advanced care planning/counseling discussion I had a discussion with Ms. Ruggerio's daughter Manuela Schwartz, who is a Designer, jewellery. She would like to consult with Hospice.  She has advanced dementia with possible CVA, dysphagia, and aspiration pna.  Her family would like to avoid hospitalizations. She has a DNR and a most form has been provided with the following recommendations based on my conversation with her daughter: No hospitalizations, no tube feeding. Determine the use of antibiotics Determine the use of IVF    Family/ staff Communication: discussed with staff/resident/daughter (susan)  Labs/tests ordered:  CBC CMP

## 2016-02-21 NOTE — Progress Notes (Signed)
Patient ID: [name], female   DOB: 1916-12-03, 82 y.o.   MRN: [id number]  Location:  [place]:  SNF (31) Provider:   Cindi Carbon, ANP [place] [phone]  REED, Jonelle Sidle, DO  Patient Care Team: Gayland Curry, DO as PCP - General (Geriatric Medicine) [facility] [place] Campbell, MD as Consulting Physician (Gastroenterology) Melina Schools, MD as Consulting Physician (Orthopedic Surgery)  Extended Emergency Contact Information Primary Emergency Contact: [facility] Phone: [phone] Relation: None Secondary Emergency Contact: [facility] Address: [address]          [place]  [place] Home Phone: [phone] Relation: None  Code Status:  DNR Goals of care: Advanced Directive information Advanced Directives 02/21/2016  Does patient have an advance directive? Yes  Type of Paramedic of [place];Living will;Out of facility DNR (pink MOST or yellow form)  Copy of advanced directive(s) in chart? Yes  Pre-existing out of facility DNR order (yellow form or pink MOST form) -  Some encounter information is confidential and restricted. Go to Review Flowsheets activity to see all data.     Chief Complaint  Patient presents with  . Acute Visit    ?TIA    HPI:  Pt is a 82 y.o. female seen today for an acute visit for reports of TIA symptoms. She has baseline dementia and weakness and is a hoyer lift but generally is pleasant, able to follow commands and uses her call button when necessary. Staff reports an episode of right facial droop and speech slurring that resolved spontaneously on the night shift.  She has had decreased vision in the right eye and increased confusion for the past week.  I have spoken with her daughter who does not want aggressive care given her advanced age of 63.  She is less confused since starting an antibiotic on 10/27 but remains  with right eye vision problems.  Has a cough with meals and there is concern for aspiration. She is currently on a ground diet with NTL.   UA C and S 10/26 >100,000 colonies E.Coli sensitive to pcn, no urinary symptoms  CXR 10/27 Showed pna in the left upper lobe, started on Augmentin   Past Medical History:  Diagnosis Date  . Abnormality of gait 2007  . Allergic rhinitis 07/23/2012  . Anemia, unspecified 2005  . Anxiety state, unspecified 2005  . Basal cell carcinoma, face 03/20/2013  . Closed fracture of sternum 2006  . Closed fracture of two ribs 2013   left 5,6th  . Depression   . Depression with anxiety   . Depressive disorder, not elsewhere classified 2005  . Disturbance of salivary secretion 2007  . Dysphagia    with chronic aspiration  . External hemorrhoids without mention of complication 0000000  . GERD (gastroesophageal reflux disease) 2005  . Hyperglycemia   . Hypertonicity of bladder 2006  . Hyponatremia   . Insomnia, unspecified 2005  . Malignant neoplasm of breast (female), unspecified site 56  . Neoplasm of unspecified nature of breast 2000  . Osteoarthrosis, unspecified whether generalized or localized, unspecified site 2005  . Other and unspecified hyperlipidemia 2005  . Other seborrheic dermatitis 2007  . Pain in joint, shoulder region 2008  . Pain in joint, shoulder region 10/21/2012  . Senile cataract, unspecified 2005  . Senile osteoporosis 2000  . Subdural hemorrhage ([place]) 2007  . Unspecified essential hypertension 2005  . Unspecified malignant neoplasm of skin of  other and unspecified parts of face 2012  . Unspecified urinary incontinence 2005  . Unspecified venous (peripheral) insufficiency 2001  . Urinary tract infection, site not specified 2013  . Vertebral fracture, closed 2006  . Vitamin D deficiency 2009   Past Surgical History:  Procedure Laterality Date  . ABDOMINAL HYSTERECTOMY    . APPENDECTOMY    . BREAST SURGERY Right    Mastectomy  .  CYST EXCISION Left 2011   Calcified cyst L shin  . ESOPHAGOGASTRODUODENOSCOPY ENDOSCOPY  07/11/2013   Dr. Watt Climes hiatus hernia  . INTRAMEDULLARY (IM) NAIL INTERTROCHANTERIC Right 02/15/2014   Procedure: INTRAMEDULLARY (IM) NAIL INTERTROCHANTRIC;  Surgeon: Melina Schools, MD;  Location: [facility];  Service: Orthopedics;  Laterality: Right;  . JOINT REPLACEMENT Left    Knee  . MOHS SURGERY  2010   Nose    Allergies  Allergen Reactions  . Other     "Epidural medication" per NH record  . Sulfa Antibiotics       Medication List       Accurate as of 02/17/16 11:59 PM. Always use your most recent med list.          acetaminophen 325 MG tablet Commonly known as:  TYLENOL Take 650 mg by mouth 2 (two) times daily. May have additional 650mg  daily as needed for pain relief   carboxymethylcellulose 0.5 % Soln Commonly known as:  REFRESH PLUS Place 2 drops into both eyes 2 (two) times daily as needed.   DULoxetine 60 MG capsule Commonly known as:  CYMBALTA Take 60 mg by mouth daily.   furosemide 20 MG tablet Commonly known as:  LASIX Take 20 mg by mouth daily.   hydrocortisone cream 1 % Apply 1 application topically. Apply as needed for itching   ipratropium 0.06 % nasal spray Commonly known as:  ATROVENT Place 2 sprays into both nostrils 3 (three) times daily.   ipratropium-albuterol 0.5-2.5 (3) MG/3ML Soln Commonly known as:  DUONEB Take 3 mLs by nebulization every 6 (six) hours as needed (wheezing).   loratadine 10 MG tablet Commonly known as:  CLARITIN Take 10 mg by mouth daily.   LORazepam 0.5 MG tablet Commonly known as:  ATIVAN Take 0.5 mg by mouth 2 (two) times daily as needed (restlessness).   LORazepam 0.5 MG tablet Commonly known as:  ATIVAN Take one tablet by mouth three times daily   Melatonin 5 MG Tabs Take 1 tablet by mouth at bedtime. For insomnia   metoprolol succinate 25 MG 24 hr tablet Commonly known as:  TOPROL-XL Take 12.5 mg by mouth at bedtime.  For HTN   omeprazole 20 MG capsule Commonly known as:  PRILOSEC Take 20 mg by mouth 2 (two) times daily before a meal.   polyethylene glycol packet Commonly known as:  MIRALAX / GLYCOLAX Take 17 g by mouth daily as needed.   potassium chloride 10 MEQ tablet Commonly known as:  K-DUR,KLOR-CON Take 10 mEq by mouth daily.   sennosides-docusate sodium 8.6-50 MG tablet Commonly known as:  SENOKOT-S Take 2 tablets by mouth at bedtime. For constipation   SODIUM FLUORIDE (DENTAL RINSE) 0.02 % Soln Use as directed 15 mLs in the mouth or throat 4 (four) times daily -  before meals and at bedtime.   traMADol 50 MG tablet Commonly known as:  ULTRAM Take 50 mg by mouth 3 (three) times daily as needed.       Review of Systems  Constitutional: Negative for activity change, appetite change, chills, diaphoresis,  fatigue and fever.  HENT: Positive for congestion, rhinorrhea, trouble swallowing and voice change. Negative for sinus pressure and sore throat.   Eyes: Positive for visual disturbance (trouble seeing items on her tray). Negative for photophobia, pain, discharge, redness and itching.       No eye pain   Respiratory: Positive for cough. Negative for shortness of breath, wheezing and stridor.   Cardiovascular: Negative for chest pain and leg swelling.  Gastrointestinal: Negative for abdominal distention, abdominal pain, blood in stool, constipation, nausea and vomiting.  Genitourinary: Negative for difficulty urinating, dysuria, flank pain and frequency.  Musculoskeletal: Positive for arthralgias and gait problem.  Neurological: Negative for dizziness, tremors, syncope, facial asymmetry, speech difficulty, weakness, light-headedness and numbness.  Psychiatric/Behavioral: Positive for behavioral problems and confusion. Negative for agitation.    Immunization History  Administered Date(s) Administered  . Influenza Inj Mdck Quad Pf 02/03/2016  . Influenza Whole 01/24/2012, 01/29/2013    . Influenza-Unspecified 01/20/2014, 02/05/2015  . PPD Test 10/10/2011  . Pneumococcal Polysaccharide-23 02/10/2004   Pertinent  Health Maintenance Due  Topic Date Due  . DEXA SCAN  01/28/1984  . PNA vac Low Risk Adult (2 of 2 - PCV13) 02/09/2005  . INFLUENZA VACCINE  Completed   No flowsheet data found. Functional Status Survey:    Vitals:   02/21/16 1401  BP: (!) 167/81  Pulse: 88  Resp: 20  Temp: 98.8 F (37.1 C)  SpO2: 93%   There is no height or weight on file to calculate BMI. Physical Exam  Constitutional: No distress.  HENT:  Head: Normocephalic and atraumatic.  Nose: Nose normal.  Mouth/Throat: Oropharynx is clear and moist. No oropharyngeal exudate.  Very dry mouth  Eyes: Conjunctivae and EOM are normal. Pupils are equal, round, and reactive to light. Right eye exhibits no discharge. Left eye exhibits no discharge.  Neck: Normal range of motion. Neck supple. No JVD present. No thyromegaly present.  Cardiovascular: Normal rate and regular rhythm.   No murmur heard. No edema  Pulmonary/Chest: She has rales (bilat, worse on the RLL).  Abdominal: Soft. Bowel sounds are normal. She exhibits no distension. There is no tenderness.  Neurological: She is alert. She has normal reflexes. No cranial nerve deficit.  Oriented to self, place, not time.  Able to follow commands. Difficulty performing finger to nose test. Able to touch her nose but had trouble seeing my finger. Identified fingers held up in front of her but had difficulty doing so.    Skin: Skin is warm and dry. She is not diaphoretic.  Psychiatric: She has a normal mood and affect.  Nursing note and vitals reviewed.   Labs reviewed:  Recent Labs  04/06/15 09/09/15 02/10/16  NA 140 141 142  K 4.2 4.2 5.3  BUN 27* 20 19  CREATININE 1.0 0.8 1.0    Recent Labs  02/10/16  AST 29  ALT 31  ALKPHOS 74    Recent Labs  09/09/15 11/16/15 0516 02/10/16  WBC 5.1 11.8 5.8  HGB 13.1 14.6 15.2  HCT 44 44  47*  PLT 252 256 233   Lab Results  Component Value Date   TSH 1.27 04/06/2015   No results found for: HGBA1C Lab Results  Component Value Date   CHOL 171 08/07/2013   HDL 42 08/07/2013   LDLCALC 109 08/07/2013   TRIG 102 08/07/2013    Significant Diagnostic Results in last 30 days:  No results found.  Assessment/Plan  1. Transient cerebral ischemia, unspecified type Symptoms  resolved but remains with right eye visual disturbance Has apt with eye doc No aggressive care per family Begin Aspirin 81 mg qd  2. HCAP (healthcare-associated pneumonia) Continue Augmentin to complete 7 days course ? Aspiration Currently on D2 diet with NTL  3. Acute cystitis without hematuria E.coli sens to pcn, continue augmentin as above    Family/ staff Communication: discussed with staff/resident/daughter (susan)  Labs/tests ordered:  CBC CMP

## 2016-02-22 ENCOUNTER — Non-Acute Institutional Stay (SKILLED_NURSING_FACILITY): Payer: Medicare Other | Admitting: Internal Medicine

## 2016-02-22 ENCOUNTER — Encounter: Payer: Self-pay | Admitting: Internal Medicine

## 2016-02-22 DIAGNOSIS — J690 Pneumonitis due to inhalation of food and vomit: Secondary | ICD-10-CM | POA: Diagnosis not present

## 2016-02-22 DIAGNOSIS — R251 Tremor, unspecified: Secondary | ICD-10-CM | POA: Diagnosis not present

## 2016-02-22 DIAGNOSIS — R000 Tachycardia, unspecified: Secondary | ICD-10-CM | POA: Diagnosis not present

## 2016-02-22 NOTE — Progress Notes (Signed)
Patient ID: [name], female   DOB: 09-28-1916, 82 y.o.   MRN: [id number]  Location:  [place] Room Number: [id number] Place of Service:  SNF (31) Provider:  Contessa Preuss L. Mariea Clonts, D.O., C.M.D.  Hollace Kinnier, DO  Patient Care Team: Gayland Curry, DO as PCP - General (Geriatric Medicine) [facility] [place], MD as Consulting Physician (Gastroenterology) Melina Schools, MD as Consulting Physician (Orthopedic Surgery)  Extended Emergency Contact Information Primary Emergency Contact: [facility] Phone: [phone] Relation: None Secondary Emergency Contact: [facility] Address: [address]          [place]  [place] Home Phone: [phone] Relation: None  Code Status:  DNR Goals of care: Advanced Directive information Advanced Directives 02/22/2016  Does patient have an advance directive? -  Type of Advance Directive Out of facility DNR (pink MOST or yellow form);Living will;Healthcare Power of Attorney  Copy of advanced directive(s) in chart? Yes  Pre-existing out of facility DNR order (yellow form or pink MOST form) Yellow form placed in chart (order not valid for inpatient use);Pink MOST form placed in chart (order not valid for inpatient use)  Some encounter information is confidential and restricted. Go to Review Flowsheets activity to see all data.     Chief Complaint  Patient presents with  . Acute Visit    shaking, confusion    HPI:  Pt is a 82 y.o. female seen today for an acute visit for shaking of her hands, increased confusion and trying to "eat her fingers".  Her vision is very poor.  Overall, she has been declining in terms of her dementia with increased difficulty with dysphagia and recurrent aspiration pneumonia. It is felt she recently had a new stroke.  She has been treated for UTI and now currently on levaquin for pneumonia since 11/6.  At first, she seemed to improved with  augmentin beginning 10/30, but continued with fevers so abx changed.  10/27 had LUL pna on CXR.  Labs normal (cbc bmp) on 10/26.  Last MMSE was 20/30 in August of 2016.    When I saw her, the tremulousness of her hands had stopped.  She was resting peacefully in bed.  She had no complaints saying she was fine again and again.  Earlier, she was tachycardic and tachypneic, as well, with sats 92%, but was breathing normally with a normal HR during my exam.  She is getting more tramadol for pain recently and a new Rx was written.    Past Medical History:  Diagnosis Date  . Abnormality of gait 2007  . Allergic rhinitis 07/23/2012  . Anemia, unspecified 2005  . Anxiety state, unspecified 2005  . Basal cell carcinoma, face 03/20/2013  . Closed fracture of sternum 2006  . Closed fracture of two ribs 2013   left 5,6th  . Depression   . Depression with anxiety   . Depressive disorder, not elsewhere classified 2005  . Disturbance of salivary secretion 2007  . Dysphagia    with chronic aspiration  . External hemorrhoids without mention of complication 0000000  . GERD (gastroesophageal reflux disease) 2005  . Hyperglycemia   . Hypertonicity of bladder 2006  . Hyponatremia   . Insomnia, unspecified 2005  . Malignant neoplasm of breast (female), unspecified site 48  . Neoplasm of unspecified nature of breast 2000  . Osteoarthrosis, unspecified whether generalized or localized, unspecified site 2005  . Other and unspecified hyperlipidemia 2005  . Other seborrheic dermatitis 2007  .  Pain in joint, shoulder region 2008  . Pain in joint, shoulder region 10/21/2012  . Senile cataract, unspecified 2005  . Senile osteoporosis 2000  . Subdural hemorrhage ([place]) 2007  . Unspecified essential hypertension 2005  . Unspecified malignant neoplasm of skin of other and unspecified parts of face 2012  . Unspecified urinary incontinence 2005  . Unspecified venous (peripheral) insufficiency 2001  . Urinary tract  infection, site not specified 2013  . Vertebral fracture, closed 2006  . Vitamin D deficiency 2009   Past Surgical History:  Procedure Laterality Date  . ABDOMINAL HYSTERECTOMY    . APPENDECTOMY    . BREAST SURGERY Right    Mastectomy  . CYST EXCISION Left 2011   Calcified cyst L shin  . ESOPHAGOGASTRODUODENOSCOPY ENDOSCOPY  07/11/2013   Dr. Watt Climes hiatus hernia  . INTRAMEDULLARY (IM) NAIL INTERTROCHANTERIC Right 02/15/2014   Procedure: INTRAMEDULLARY (IM) NAIL INTERTROCHANTRIC;  Surgeon: Melina Schools, MD;  Location: [facility];  Service: Orthopedics;  Laterality: Right;  . JOINT REPLACEMENT Left    Knee  . MOHS SURGERY  2010   Nose    Allergies  Allergen Reactions  . Other     "Epidural medication" per NH record  . Sulfa Antibiotics       Medication List       Accurate as of 02/22/16  2:42 PM. Always use your most recent med list.          acetaminophen 325 MG tablet Commonly known as:  TYLENOL Take 650 mg by mouth 2 (two) times daily. May have additional 650mg  daily as needed for pain relief   aspirin EC 81 MG tablet Take 81 mg by mouth daily.   carboxymethylcellulose 0.5 % Soln Commonly known as:  REFRESH PLUS Place 2 drops into both eyes 2 (two) times daily as needed.   DULoxetine 60 MG capsule Commonly known as:  CYMBALTA Take 60 mg by mouth daily.   furosemide 20 MG tablet Commonly known as:  LASIX Take 20 mg by mouth daily.   hydrocortisone cream 1 % Apply 1 application topically. Apply as needed for itching   ipratropium 0.06 % nasal spray Commonly known as:  ATROVENT Place 2 sprays into both nostrils 3 (three) times daily.   ipratropium-albuterol 0.5-2.5 (3) MG/3ML Soln Commonly known as:  DUONEB Take 3 mLs by nebulization every 6 (six) hours as needed (wheezing).   ipratropium-albuterol 0.5-2.5 (3) MG/3ML Soln Commonly known as:  DUONEB Take 3 mLs by nebulization 3 (three) times daily.   levofloxacin 500 MG tablet Commonly known as:   LEVAQUIN Take 500 mg by mouth daily.   loratadine 10 MG tablet Commonly known as:  CLARITIN Take 10 mg by mouth daily.   LORazepam 0.5 MG tablet Commonly known as:  ATIVAN Take 0.5 mg by mouth 2 (two) times daily as needed (restlessness).   LORazepam 0.5 MG tablet Commonly known as:  ATIVAN Take one tablet by mouth three times daily   Melatonin 5 MG Tabs Take 1 tablet by mouth at bedtime. For insomnia   metoprolol succinate 25 MG 24 hr tablet Commonly known as:  TOPROL-XL Take 12.5 mg by mouth at bedtime. For HTN   omeprazole 20 MG capsule Commonly known as:  PRILOSEC Take 20 mg by mouth 2 (two) times daily before a meal.   polyethylene glycol packet Commonly known as:  MIRALAX / GLYCOLAX Take 17 g by mouth daily as needed.   potassium chloride 10 MEQ tablet Commonly known as:  K-DUR,KLOR-CON Take  10 mEq by mouth daily.   saccharomyces boulardii 250 MG capsule Commonly known as:  FLORASTOR Take 250 mg by mouth 2 (two) times daily.   sennosides-docusate sodium 8.6-50 MG tablet Commonly known as:  SENOKOT-S Take 2 tablets by mouth at bedtime. For constipation   SODIUM FLUORIDE (DENTAL RINSE) 0.02 % Soln Use as directed 15 mLs in the mouth or throat 4 (four) times daily -  before meals and at bedtime.   traMADol 50 MG tablet Commonly known as:  ULTRAM Take 50 mg by mouth 3 (three) times daily as needed.       Review of Systems  Constitutional: Positive for activity change, appetite change and fatigue. Negative for chills and fever.  HENT: Positive for congestion.   Eyes: Positive for visual disturbance.  Respiratory: Positive for cough. Negative for shortness of breath.   Cardiovascular: Negative for chest pain and leg swelling.  Gastrointestinal: Negative for abdominal distention.  Endocrine: Positive for cold intolerance.  Genitourinary: Negative for dysuria.  Musculoskeletal: Positive for arthralgias and gait problem.  Skin: Positive for pallor.    Neurological: Positive for tremors and weakness.  Psychiatric/Behavioral: Positive for confusion.    Immunization History  Administered Date(s) Administered  . Influenza Inj Mdck Quad Pf 02/03/2016  . Influenza Whole 01/24/2012, 01/29/2013  . Influenza-Unspecified 01/20/2014, 02/05/2015  . PPD Test 10/10/2011  . Pneumococcal Polysaccharide-23 02/10/2004   Pertinent  Health Maintenance Due  Topic Date Due  . DEXA SCAN  01/28/1984  . PNA vac Low Risk Adult (2 of 2 - PCV13) 02/09/2005  . INFLUENZA VACCINE  Completed     Vitals:   02/22/16 1412  BP: (!) 168/88  Pulse: 85  Resp: 20  Temp: 98.3 F (36.8 C)  TempSrc: Oral  SpO2: 91%   There is no height or weight on file to calculate BMI. Physical Exam  Constitutional: No distress.  HENT:  Head: Normocephalic and atraumatic.  Cardiovascular: Normal rate, regular rhythm and normal heart sounds.   Pulmonary/Chest: No respiratory distress.  Dry cough, course rhonchi  Abdominal: Soft. Bowel sounds are normal.  Musculoskeletal:  Decreased ROM shoulders  Neurological: She is alert.  Skin: Skin is warm and dry.  Psychiatric:  Did not appear very anxious now either    Labs reviewed:  Recent Labs  04/06/15 09/09/15 02/10/16  NA 140 141 142  K 4.2 4.2 5.3  BUN 27* 20 19  CREATININE 1.0 0.8 1.0    Recent Labs  02/10/16  AST 29  ALT 31  ALKPHOS 74    Recent Labs  09/09/15 11/16/15 0516 02/10/16  WBC 5.1 11.8 5.8  HGB 13.1 14.6 15.2  HCT 44 44 47*  PLT 252 256 233   Lab Results  Component Value Date   TSH 1.27 04/06/2015   No results found for: HGBA1C Lab Results  Component Value Date   CHOL 171 08/07/2013   HDL 42 08/07/2013   LDLCALC 109 08/07/2013   TRIG 102 08/07/2013    Significant Diagnostic Results in last 30 days:  Reviewed recent labs and xrays in chart  Assessment/Plan 1. Tachycardia -suspect she may have had some early serotonin syndrome from more tramadol lately with her cymbalta so  will d/c tramadol and use morphine concentrate for her pain  2. Tremor -seems it resolved so could have been serotonin syndrome and last tramadol out of her system or could have been due to anxiety which she has chronically  3. Aspiration pneumonia of left upper lobe, unspecified aspiration  pneumonia type ([place]) -complete course of levaquin, encourage hydration -do not hospitalize -cont comfort measures/hospice care  Family/ staff Communication: discussed with SNF nurse  Labs/tests ordered:  No new

## 2016-03-13 ENCOUNTER — Non-Acute Institutional Stay (SKILLED_NURSING_FACILITY): Payer: Medicare Other | Admitting: Adult Health

## 2016-03-13 ENCOUNTER — Encounter: Payer: Self-pay | Admitting: Adult Health

## 2016-03-13 DIAGNOSIS — F0151 Vascular dementia with behavioral disturbance: Secondary | ICD-10-CM | POA: Diagnosis not present

## 2016-03-13 DIAGNOSIS — F03918 Unspecified dementia, unspecified severity, with other behavioral disturbance: Secondary | ICD-10-CM | POA: Insufficient documentation

## 2016-03-13 DIAGNOSIS — I10 Essential (primary) hypertension: Secondary | ICD-10-CM | POA: Diagnosis not present

## 2016-03-13 DIAGNOSIS — F0391 Unspecified dementia with behavioral disturbance: Secondary | ICD-10-CM | POA: Insufficient documentation

## 2016-03-13 DIAGNOSIS — F01518 Vascular dementia, unspecified severity, with other behavioral disturbance: Secondary | ICD-10-CM

## 2016-03-13 NOTE — Progress Notes (Signed)
Location:  Occupational psychologist of Service:  SNF (31) Provider:   Cindi Carbon, ANP [place] [phone]   REED, Jonelle Sidle, DO  Patient Care Team: Gayland Curry, DO as PCP - General (Geriatric Medicine) Well [facility] Richmond Campbell, MD as Consulting Physician (Gastroenterology) Melina Schools, MD as Consulting Physician (Orthopedic Surgery)  Extended Emergency Contact Information Primary Emergency Contact: [facility] Phone: [phone] Relation: None Secondary Emergency Contact: [facility] Address: [address]          [place] Gary  [place] Home Phone: [phone] Relation: None  Code Status:  DNR Goals of care: Advanced Directive information Advanced Directives 03/13/2016  Does Patient Have a Medical Advance Directive? Yes  Type of Advance Directive Out of facility DNR (pink MOST or yellow form);Living will;Healthcare Power of [place] in Chart? -  Pre-existing out of facility DNR order (yellow form or pink MOST form) Yellow form placed in chart (order not valid for inpatient use);Pink MOST form placed in chart (order not valid for inpatient use)  Some encounter information is confidential and restricted. Go to Review Flowsheets activity to see all data.     Chief Complaint  Patient presents with  . Acute Visit    lethargy in am, confusion increase in afternoon    HPI:  Pt is a 82 y.o. female seen today for an acute visit for lethargy in the am and periods of agitation in the afternoon.  She is currently under hospice care due to dementia with probable CVA, dysphagia and aspiration.  She has had anxiety long term and hospice increased her Ativan to 1 mg TID.  Staff report that she is sleepy in the am and tends to get anxious in the afternoon. Staff report that she is having hallucinations at times as well but they do not seem to upset her. She has vision loss  that may be due to the CVA, as well as her age which confounds her symptoms. Her goals of care are comfort based.  She has chronic dysphagia and her family has decided to liberalize her diet for quality of life reasons. She is coughing at meals but has no fever.  BP has been consistently elevated over the 170's and she seems uncomfortable and has a headache at times per the staff. She has difficulty elaborating on her symptoms due to her advancing dementia. She is has been progressively less verbal, weaker, and requires more assistance.  Past Medical History:  Diagnosis Date  . Abnormality of gait 2007  . Allergic rhinitis 07/23/2012  . Anemia, unspecified 2005  . Anxiety state, unspecified 2005  . Basal cell carcinoma, face 03/20/2013  . Closed fracture of sternum 2006  . Closed fracture of two ribs 2013   left 5,6th  . Depression   . Depression with anxiety   . Depressive disorder, not elsewhere classified 2005  . Disturbance of salivary secretion 2007  . Dysphagia    with chronic aspiration  . External hemorrhoids without mention of complication 0000000  . GERD (gastroesophageal reflux disease) 2005  . Hyperglycemia   . Hypertonicity of bladder 2006  . Hyponatremia   . Insomnia, unspecified 2005  . Malignant neoplasm of breast (female), unspecified site 63  . Neoplasm of unspecified nature of breast 2000  . Osteoarthrosis, unspecified whether generalized or localized, unspecified site 2005  . Other and unspecified hyperlipidemia 2005  . Other seborrheic dermatitis 2007  . Pain in joint,  shoulder region 2008  . Pain in joint, shoulder region 10/21/2012  . Senile cataract, unspecified 2005  . Senile osteoporosis 2000  . Subdural hemorrhage ([place]) 2007  . Unspecified essential hypertension 2005  . Unspecified malignant neoplasm of skin of other and unspecified parts of face 2012  . Unspecified urinary incontinence 2005  . Unspecified venous (peripheral) insufficiency 2001  . Urinary  tract infection, site not specified 2013  . Vertebral fracture, closed 2006  . Vitamin D deficiency 2009   Past Surgical History:  Procedure Laterality Date  . ABDOMINAL HYSTERECTOMY    . APPENDECTOMY    . BREAST SURGERY Right    Mastectomy  . CYST EXCISION Left 2011   Calcified cyst L shin  . ESOPHAGOGASTRODUODENOSCOPY ENDOSCOPY  07/11/2013   Dr. Watt Climes hiatus hernia  . INTRAMEDULLARY (IM) NAIL INTERTROCHANTERIC Right 02/15/2014   Procedure: INTRAMEDULLARY (IM) NAIL INTERTROCHANTRIC;  Surgeon: Melina Schools, MD;  Location: [facility];  Service: Orthopedics;  Laterality: Right;  . JOINT REPLACEMENT Left    Knee  . MOHS SURGERY  2010   Nose    Allergies  Allergen Reactions  . Other     "Epidural medication" per NH record  . Sulfa Antibiotics       Medication List       Accurate as of 03/13/16 11:09 AM. Always use your most recent med list.          acetaminophen 325 MG tablet Commonly known as:  TYLENOL Take 650 mg by mouth 2 (two) times daily. May have additional 650mg  daily as needed for pain relief   aspirin EC 81 MG tablet Take 81 mg by mouth daily.   carboxymethylcellulose 0.5 % Soln Commonly known as:  REFRESH PLUS Place 2 drops into both eyes 2 (two) times daily as needed.   DULoxetine 60 MG capsule Commonly known as:  CYMBALTA Take 60 mg by mouth daily.   furosemide 20 MG tablet Commonly known as:  LASIX Take 20 mg by mouth daily.   hydrocortisone cream 1 % Apply 1 application topically. Apply as needed for itching   ipratropium 0.06 % nasal spray Commonly known as:  ATROVENT Place 2 sprays into both nostrils 3 (three) times daily.   ipratropium-albuterol 0.5-2.5 (3) MG/3ML Soln Commonly known as:  DUONEB Take 3 mLs by nebulization every 6 (six) hours as needed (wheezing).   ipratropium-albuterol 0.5-2.5 (3) MG/3ML Soln Commonly known as:  DUONEB Take 3 mLs by nebulization 3 (three) times daily.   loratadine 10 MG tablet Commonly known as:   CLARITIN Take 10 mg by mouth daily.   LORazepam 1 MG tablet Commonly known as:  ATIVAN Take 1 mg by mouth 3 (three) times daily. 1 mg BID prn   Melatonin 5 MG Tabs Take 1 tablet by mouth at bedtime. For insomnia   metoprolol succinate 25 MG 24 hr tablet Commonly known as:  TOPROL-XL Take 12.5 mg by mouth at bedtime. For HTN   morphine 20 MG/ML concentrated solution Commonly known as:  ROXANOL Take 5 mg by mouth every 6 (six) hours as needed for severe pain.   omeprazole 20 MG capsule Commonly known as:  PRILOSEC Take 20 mg by mouth 2 (two) times daily before a meal.   polyethylene glycol packet Commonly known as:  MIRALAX / GLYCOLAX Take 17 g by mouth daily as needed.   potassium chloride 10 MEQ tablet Commonly known as:  K-DUR,KLOR-CON Take 10 mEq by mouth daily.   sennosides-docusate sodium 8.6-50 MG tablet Commonly  known as:  SENOKOT-S Take 2 tablets by mouth at bedtime. For constipation   SODIUM FLUORIDE (DENTAL RINSE) 0.02 % Soln Use as directed 15 mLs in the mouth or throat 4 (four) times daily -  before meals and at bedtime.       Review of Systems  Constitutional: Negative for activity change, appetite change, chills, diaphoresis, fatigue, fever and unexpected weight change.  HENT: Positive for trouble swallowing. Negative for congestion.   Eyes: Positive for visual disturbance.  Respiratory: Positive for cough. Negative for shortness of breath and wheezing.   Cardiovascular: Negative for chest pain, palpitations and leg swelling.  Gastrointestinal: Negative for abdominal distention, abdominal pain, constipation and diarrhea.  Genitourinary: Negative for difficulty urinating and dysuria.  Musculoskeletal: Positive for arthralgias and gait problem. Negative for back pain, joint swelling and myalgias.  Neurological: Positive for weakness. Negative for dizziness, tremors, seizures, syncope, facial asymmetry, speech difficulty, light-headedness, numbness and  headaches.  Psychiatric/Behavioral: Positive for agitation, behavioral problems, confusion and hallucinations. The patient is nervous/anxious.     Immunization History  Administered Date(s) Administered  . Influenza Inj Mdck Quad Pf 02/03/2016  . Influenza Whole 01/24/2012, 01/29/2013  . Influenza-Unspecified 01/20/2014, 02/05/2015  . PPD Test 10/10/2011  . Pneumococcal Polysaccharide-23 02/10/2004   Pertinent  Health Maintenance Due  Topic Date Due  . DEXA SCAN  01/28/1984  . PNA vac Low Risk Adult (2 of 2 - PCV13) 02/09/2005  . INFLUENZA VACCINE  Completed   No flowsheet data found. Functional Status Survey:    Vitals:   03/13/16 1100  BP: (!) 179/84  Pulse: 71  Resp: 16  Temp: 98 F (36.7 C)  SpO2: 97%  Weight: 159 lb (72.1 kg)   Body mass index is 31.05 kg/m. Physical Exam  Constitutional: No distress.  HENT:  Head: Normocephalic and atraumatic.  Eyes: Conjunctivae are normal. Pupils are equal, round, and reactive to light. Right eye exhibits no discharge. Left eye exhibits no discharge.  Poor vision  Neck: No JVD present.  Cardiovascular: Normal rate and regular rhythm.   No murmur heard. Pulmonary/Chest: Effort normal. No respiratory distress. She has no wheezes. She has rales.  bilat rhonchi  Abdominal: Soft. Bowel sounds are normal. She exhibits no distension.  Neurological: She is alert.  Oriented to self, difficulty following commands  Skin: Skin is warm and dry. She is not diaphoretic.  Psychiatric:  Sleepy but arousable  Nursing note and vitals reviewed.   Labs reviewed:  Recent Labs  04/06/15 09/09/15 02/10/16  NA 140 141 142  K 4.2 4.2 5.3  BUN 27* 20 19  CREATININE 1.0 0.8 1.0    Recent Labs  02/10/16  AST 29  ALT 31  ALKPHOS 74    Recent Labs  09/09/15 11/16/15 0516 02/10/16  WBC 5.1 11.8 5.8  HGB 13.1 14.6 15.2  HCT 44 44 47*  PLT 252 256 233   Lab Results  Component Value Date   TSH 1.27 04/06/2015   No results found  for: HGBA1C Lab Results  Component Value Date   CHOL 171 08/07/2013   HDL 42 08/07/2013   LDLCALC 109 08/07/2013   TRIG 102 08/07/2013    Significant Diagnostic Results in last 30 days:  No results found.  Assessment/Plan  1. Essential hypertension Consistently elevated BP Increase metoprolol to 25 XL mg qd  2. Vascular dementia with behavior disturbance Reduce am ativan to 0.5 mg due to lethargy, keep 1mg  dosing mid day and evening as this is the  time where she seems to have more anxiety Has hallucinations but would hold off on seroquel unless they seem to upset her.  She is progressively declining overall and her goals of care are comfort based.     Family/ staff Communication: discussed with Tammy nurse  Labs/tests ordered:  NA, followed by hospice

## 2016-03-21 ENCOUNTER — Encounter: Payer: Self-pay | Admitting: Internal Medicine

## 2016-03-21 ENCOUNTER — Non-Acute Institutional Stay (SKILLED_NURSING_FACILITY): Payer: Medicare Other | Admitting: Internal Medicine

## 2016-03-21 DIAGNOSIS — K224 Dyskinesia of esophagus: Secondary | ICD-10-CM

## 2016-03-21 DIAGNOSIS — F0151 Vascular dementia with behavioral disturbance: Secondary | ICD-10-CM | POA: Diagnosis not present

## 2016-03-21 DIAGNOSIS — F411 Generalized anxiety disorder: Secondary | ICD-10-CM

## 2016-03-21 DIAGNOSIS — T17908A Unspecified foreign body in respiratory tract, part unspecified causing other injury, initial encounter: Secondary | ICD-10-CM

## 2016-03-21 DIAGNOSIS — F01518 Vascular dementia, unspecified severity, with other behavioral disturbance: Secondary | ICD-10-CM

## 2016-03-21 NOTE — Progress Notes (Signed)
Patient ID: [name], female   DOB: Oct 07, 1916, 82 y.o.   MRN: [id number]  Location:  [place] Room Number: [id number] Place of Service:  SNF (31) Provider:  Sandy Blouch L. Mariea Clonts, D.O., C.M.D.  Hollace Kinnier, DO  Patient Care Team: Gayland Curry, DO as PCP - General (Geriatric Medicine) [facility] [place] Campbell, MD as Consulting Physician (Gastroenterology) Melina Schools, MD as Consulting Physician (Orthopedic Surgery)  Extended Emergency Contact Information Primary Emergency Contact: [facility] Phone: [phone] Relation: None Secondary Emergency Contact: [facility] Address: [address]          [place]  [place] Home Phone: [phone] Relation: None  Code Status:  DNR Goals of care: Advanced Directive information Advanced Directives 03/21/2016  Does Patient Have a Medical Advance Directive? Yes  Type of Advance Directive Out of facility DNR (pink MOST or yellow form);Living will  Copy of [place] in Chart? -  Pre-existing out of facility DNR order (yellow form or pink MOST form) Yellow form placed in chart (order not valid for inpatient use);Pink MOST form placed in chart (order not valid for inpatient use)  Some encounter information is confidential and restricted. Go to Review Flowsheets activity to see all data.     Chief Complaint  Patient presents with  . Acute Visit    choking episode    HPI:  Pt is a 82 y.o. female with h/o dysphagia (esophageal dysmotility), depression and anxiety, rotator cuff tendonitis, htn, dementia, urinary incontinence and others seen today for an acute visit for a choking episode.  She was having her breakfast this am and aspirated on OJ.  Heimlich maneuver was performed by RN.  She previously had this happen with a ground hot dog at lunch on 03/18/16.  Since this am's episode, she's been having congestion, decreased breath sounds and wheezing.  She's been more confused and lethargic also.    Past Medical History:  Diagnosis Date  . Abnormality of gait 2007  . Allergic rhinitis 07/23/2012  . Anemia, unspecified 2005  . Anxiety state, unspecified 2005  . Basal cell carcinoma, face 03/20/2013  . Closed fracture of sternum 2006  . Closed fracture of two ribs 2013   left 5,6th  . Depression   . Depression with anxiety   . Depressive disorder, not elsewhere classified 2005  . Disturbance of salivary secretion 2007  . Dysphagia    with chronic aspiration  . External hemorrhoids without mention of complication 0000000  . GERD (gastroesophageal reflux disease) 2005  . Hyperglycemia   . Hypertonicity of bladder 2006  . Hyponatremia   . Insomnia, unspecified 2005  . Malignant neoplasm of breast (female), unspecified site 74  . Neoplasm of unspecified nature of breast 2000  . Osteoarthrosis, unspecified whether generalized or localized, unspecified site 2005  . Other and unspecified hyperlipidemia 2005  . Other seborrheic dermatitis 2007  . Pain in joint, shoulder region 2008  . Pain in joint, shoulder region 10/21/2012  . Senile cataract, unspecified 2005  . Senile osteoporosis 2000  . Subdural hemorrhage ([employer]) 2007  . Unspecified essential hypertension 2005  . Unspecified malignant neoplasm of skin of other and unspecified parts of face 2012  . Unspecified urinary incontinence 2005  . Unspecified venous (peripheral) insufficiency 2001  . Urinary tract infection, site not specified 2013  . Vertebral fracture, closed 2006  . Vitamin D deficiency 2009   Past Surgical History:  Procedure Laterality Date  . ABDOMINAL HYSTERECTOMY    .  APPENDECTOMY    . BREAST SURGERY Right    Mastectomy  . CYST EXCISION Left 2011   Calcified cyst L shin  . ESOPHAGOGASTRODUODENOSCOPY ENDOSCOPY  07/11/2013   Dr. Watt Climes hiatus hernia  . INTRAMEDULLARY (IM) NAIL INTERTROCHANTERIC Right 02/15/2014   Procedure: INTRAMEDULLARY (IM) NAIL  INTERTROCHANTRIC;  Surgeon: Melina Schools, MD;  Location: [facility];  Service: Orthopedics;  Laterality: Right;  . JOINT REPLACEMENT Left    Knee  . MOHS SURGERY  2010   Nose    Allergies  Allergen Reactions  . Other     "Epidural medication" per NH record  . Sulfa Antibiotics       Medication List       Accurate as of 03/21/16 12:01 PM. Always use your most recent med list.          acetaminophen 325 MG tablet Commonly known as:  TYLENOL Take 650 mg by mouth 2 (two) times daily. May have additional 650mg  daily as needed for pain relief   aspirin EC 81 MG tablet Take 81 mg by mouth daily.   carboxymethylcellulose 0.5 % Soln Commonly known as:  REFRESH PLUS Place 2 drops into both eyes 2 (two) times daily as needed.   DULoxetine 60 MG capsule Commonly known as:  CYMBALTA Take 60 mg by mouth daily.   furosemide 20 MG tablet Commonly known as:  LASIX Take 20 mg by mouth daily.   hydrocortisone cream 1 % Apply 1 application topically. Apply as needed for itching   ipratropium 0.06 % nasal spray Commonly known as:  ATROVENT Place 2 sprays into both nostrils 3 (three) times daily.   ipratropium-albuterol 0.5-2.5 (3) MG/3ML Soln Commonly known as:  DUONEB Take 3 mLs by nebulization every 6 (six) hours as needed (wheezing).   loratadine 10 MG tablet Commonly known as:  CLARITIN Take 10 mg by mouth daily.   LORazepam 1 MG tablet Commonly known as:  ATIVAN 0.5mg  every morning and 1 mg at lunch and dinner and 1 mg twice daily as needed anxiety   Melatonin 5 MG Tabs Take 1 tablet by mouth at bedtime. For insomnia   metoprolol succinate 25 MG 24 hr tablet Commonly known as:  TOPROL-XL Take 25 mg by mouth at bedtime. For HTN   morphine 20 MG/ML concentrated solution Commonly known as:  ROXANOL Take 5 mg by mouth every 6 (six) hours as needed for severe pain.   omeprazole 20 MG capsule Commonly known as:  PRILOSEC Take 20 mg by mouth 2 (two) times daily before a  meal.   polyethylene glycol packet Commonly known as:  MIRALAX / GLYCOLAX Take 17 g by mouth daily as needed.   potassium chloride 10 MEQ tablet Commonly known as:  K-DUR,KLOR-CON Take 10 mEq by mouth daily.   sennosides-docusate sodium 8.6-50 MG tablet Commonly known as:  SENOKOT-S Take 2 tablets by mouth at bedtime. For constipation   SODIUM FLUORIDE (DENTAL RINSE) 0.02 % Soln Use as directed 15 mLs in the mouth or throat 4 (four) times daily -  before meals and at bedtime.       Review of Systems  Constitutional: Positive for appetite change and unexpected weight change. Negative for chills and fever.  HENT: Negative for congestion.   Respiratory: Positive for cough, choking, shortness of breath and wheezing. Negative for apnea, chest tightness and stridor.   Cardiovascular: Negative for chest pain and leg swelling.  Gastrointestinal: Negative for abdominal distention, abdominal pain, constipation, diarrhea, nausea, rectal pain and vomiting.  Genitourinary: Negative for dysuria.  Musculoskeletal: Positive for arthralgias and gait problem.  Skin: Positive for pallor.  Neurological: Positive for weakness. Negative for dizziness.  Hematological: Bruises/bleeds easily.  Psychiatric/Behavioral: Positive for confusion. The patient is nervous/anxious.     Immunization History  Administered Date(s) Administered  . Influenza Inj Mdck Quad Pf 02/03/2016  . Influenza Whole 01/24/2012, 01/29/2013  . Influenza-Unspecified 01/20/2014, 02/05/2015  . PPD Test 10/10/2011  . Pneumococcal Polysaccharide-23 02/10/2004   Pertinent  Health Maintenance Due  Topic Date Due  . DEXA SCAN  01/28/1984  . PNA vac Low Risk Adult (2 of 2 - PCV13) 02/09/2005  . INFLUENZA VACCINE  Completed   No flowsheet data found. Functional Status Survey:    Vitals:   03/21/16 1139  BP: (!) 142/60  Pulse: 65  Resp: 16  Temp: 98.8 F (37.1 C)  TempSrc: Oral  SpO2: 93%   There is no height or weight  on file to calculate BMI. Physical Exam  Constitutional: No distress.  Cardiovascular: Normal rate, regular rhythm, normal heart sounds and intact distal pulses.   Pulmonary/Chest: Effort normal. She has wheezes.  Moist rhonchi and wheezes upper lung fields  Abdominal: Soft. Bowel sounds are normal.  Musculoskeletal: Normal range of motion.  Neurological: She is alert.  tremulous  Skin: Skin is warm and dry. There is pallor.    Labs reviewed:  Recent Labs  04/06/15 09/09/15 02/10/16  NA 140 141 142  K 4.2 4.2 5.3  BUN 27* 20 19  CREATININE 1.0 0.8 1.0    Recent Labs  02/10/16  AST 29  ALT 31  ALKPHOS 74    Recent Labs  09/09/15 11/16/15 0516 02/10/16  WBC 5.1 11.8 5.8  HGB 13.1 14.6 15.2  HCT 44 44 47*  PLT 252 256 233   Lab Results  Component Value Date   TSH 1.27 04/06/2015   No results found for: HGBA1C Lab Results  Component Value Date   CHOL 171 08/07/2013   HDL 42 08/07/2013   LDLCALC 109 08/07/2013   TRIG 102 08/07/2013    Assessment/Plan 1. Aspiration into airway, initial encounter -obtain chest xray to ensure no obvious infiltrate as a result, but seems improved from description I received earlier -cont aspiration precautions -she is on hospice care now and this is one of the reasons--having trouble with all consistencies it seems now -cont roxanol as needed for sob  2. Esophageal dysmotility -felt to cause #1  3. Vascular dementia with behavior disturbance -advanced, and complicated by her anxiety -not on meds -cont comfort measures  4. Anxiety state -severe, cont her cymbalta, ativan  Family/ staff Communication: discussed with snf nurse  Labs/tests ordered:  Chest xray

## 2016-04-20 ENCOUNTER — Encounter: Payer: Self-pay | Admitting: Adult Health

## 2016-04-20 ENCOUNTER — Non-Acute Institutional Stay (SKILLED_NURSING_FACILITY): Payer: Medicare Other | Admitting: Adult Health

## 2016-04-20 DIAGNOSIS — R682 Dry mouth, unspecified: Secondary | ICD-10-CM | POA: Diagnosis not present

## 2016-04-20 DIAGNOSIS — R600 Localized edema: Secondary | ICD-10-CM

## 2016-04-20 DIAGNOSIS — J3089 Other allergic rhinitis: Secondary | ICD-10-CM | POA: Diagnosis not present

## 2016-04-20 DIAGNOSIS — I10 Essential (primary) hypertension: Secondary | ICD-10-CM

## 2016-04-20 DIAGNOSIS — K117 Disturbances of salivary secretion: Secondary | ICD-10-CM

## 2016-04-20 DIAGNOSIS — F0151 Vascular dementia with behavioral disturbance: Secondary | ICD-10-CM | POA: Diagnosis not present

## 2016-04-20 DIAGNOSIS — R1310 Dysphagia, unspecified: Secondary | ICD-10-CM | POA: Diagnosis not present

## 2016-04-20 DIAGNOSIS — F01518 Vascular dementia, unspecified severity, with other behavioral disturbance: Secondary | ICD-10-CM

## 2016-04-20 DIAGNOSIS — R1319 Other dysphagia: Secondary | ICD-10-CM

## 2016-04-20 NOTE — Progress Notes (Signed)
Location:  Occupational psychologist of Service:  SNF (31) Provider:   Cindi Carbon, ANP [place] [phone]   REED, Jonelle Sidle, DO  Patient Care Team: Gayland Curry, DO as PCP - General (Geriatric Medicine) Well [facility] [place] Campbell, MD as Consulting Physician (Gastroenterology) Melina Schools, MD as Consulting Physician (Orthopedic Surgery)  Extended Emergency Contact Information Primary Emergency Contact: [facility] Phone: [phone] Relation: None Secondary Emergency Contact: [facility] Address: [address]          [place]  [place] Phone: [phone] Relation: None  Code Status:  DNR Goals of care: Advanced Directive information Advanced Directives 04/20/2016  Does Patient Have a Medical Advance Directive? Yes  Type of Advance Directive Out of facility DNR (pink MOST or yellow form);Living will  Copy of [place] in Chart? Yes  Pre-existing out of facility DNR order (yellow form or pink MOST form) Yellow form placed in chart (order not valid for inpatient use);Pink MOST form placed in chart (order not valid for inpatient use)  Some encounter information is confidential and restricted. Go to Review Flowsheets activity to see all data.     Chief Complaint  Patient presents with  . Medical Management of Chronic Issues    HPI:  Pt is a 83 y.o. female seen today for medical management of chronic diseases.  She is currently under hospice care due to dementia with probable CVA, dysphagia and aspiration.  She was downgraded to puree with NTL due to coughing with meals and this seems to have helped. No runny nose or congestion, has chronic rhinitis and uses Atrovent and Claritin.   Has periodic hallucinations and anxiety, worse in the evening.  Hallucinations do not tend to upset her.   Has caregivers to redirect her during the day when she becomes agitated, also takes ativan  which helps. Reports intermittent leg pain, uses tylenol BID scheduled. Hx of OA to the knees.  Has not used prn morphine.   Decreased p.o. Intake over time with 8 lb weight loss since Oct of 2017.  No edema, on lasix daily BP elevated at times [id number] range systolic.  Currently on toprol 25 mg qd  Past Medical History:  Diagnosis Date  . Abnormality of gait 2007  . Allergic rhinitis 07/23/2012  . Anemia, unspecified 2005  . Anxiety state, unspecified 2005  . Basal cell carcinoma, face 03/20/2013  . Closed fracture of sternum 2006  . Closed fracture of two ribs 2013   left 5,6th  . Depression   . Depression with anxiety   . Depressive disorder, not elsewhere classified 2005  . Disturbance of salivary secretion 2007  . Dysphagia    with chronic aspiration  . External hemorrhoids without mention of complication 0000000  . GERD (gastroesophageal reflux disease) 2005  . Hyperglycemia   . Hypertonicity of bladder 2006  . Hyponatremia   . Insomnia, unspecified 2005  . Malignant neoplasm of breast (female), unspecified site 78  . Neoplasm of unspecified nature of breast 2000  . Osteoarthrosis, unspecified whether generalized or localized, unspecified site 2005  . Other and unspecified hyperlipidemia 2005  . Other seborrheic dermatitis 2007  . Pain in joint, shoulder region 2008  . Pain in joint, shoulder region 10/21/2012  . Senile cataract, unspecified 2005  . Senile osteoporosis 2000  . Subdural hemorrhage ([place]) 2007  . Unspecified essential hypertension 2005  . Unspecified malignant neoplasm of skin of other and unspecified parts of face  2012  . Unspecified urinary incontinence 2005  . Unspecified venous (peripheral) insufficiency 2001  . Urinary tract infection, site not specified 2013  . Vertebral fracture, closed 2006  . Vitamin D deficiency 2009   Past Surgical History:  Procedure Laterality Date  . ABDOMINAL HYSTERECTOMY    . APPENDECTOMY    . BREAST SURGERY Right     Mastectomy  . CYST EXCISION Left 2011   Calcified cyst L shin  . ESOPHAGOGASTRODUODENOSCOPY ENDOSCOPY  07/11/2013   Dr. Watt Climes hiatus hernia  . INTRAMEDULLARY (IM) NAIL INTERTROCHANTERIC Right 02/15/2014   Procedure: INTRAMEDULLARY (IM) NAIL INTERTROCHANTRIC;  Surgeon: Melina Schools, MD;  Location: [facility];  Service: Orthopedics;  Laterality: Right;  . JOINT REPLACEMENT Left    Knee  . MOHS SURGERY  2010   Nose    Allergies  Allergen Reactions  . Other     "Epidural medication" per NH record  . Sulfa Antibiotics     Allergies as of 04/20/2016      Reactions   Other    "Epidural medication" per NH record   Sulfa Antibiotics       Medication List       Accurate as of 04/20/16  4:13 PM. Always use your most recent med list.          acetaminophen 325 MG tablet Commonly known as:  TYLENOL Take 650 mg by mouth 2 (two) times daily. May have additional 650mg  daily as needed for pain relief   aspirin EC 81 MG tablet Take 81 mg by mouth daily.   carboxymethylcellulose 0.5 % Soln Commonly known as:  REFRESH PLUS Place 2 drops into both eyes 2 (two) times daily as needed.   DULoxetine 60 MG capsule Commonly known as:  CYMBALTA Take 60 mg by mouth daily.   furosemide 20 MG tablet Commonly known as:  LASIX Take 20 mg by mouth daily.   hydrocortisone cream 1 % Apply 1 application topically. Apply as needed for itching   ipratropium 0.06 % nasal spray Commonly known as:  ATROVENT Place 2 sprays into both nostrils 3 (three) times daily.   ipratropium-albuterol 0.5-2.5 (3) MG/3ML Soln Commonly known as:  DUONEB Take 3 mLs by nebulization every 6 (six) hours as needed (wheezing).   loratadine 10 MG tablet Commonly known as:  CLARITIN Take 10 mg by mouth daily.   LORazepam 1 MG tablet Commonly known as:  ATIVAN 0.5mg  every morning and 1 mg at lunch and dinner and 1 mg twice daily as needed anxiety   Melatonin 5 MG Tabs Take 1 tablet by mouth at bedtime. For insomnia     metoprolol succinate 25 MG 24 hr tablet Commonly known as:  TOPROL-XL Take 25 mg by mouth at bedtime. For HTN   morphine 20 MG/ML concentrated solution Commonly known as:  ROXANOL Take 5 mg by mouth every 6 (six) hours as needed for severe pain.   omeprazole 20 MG capsule Commonly known as:  PRILOSEC Take 20 mg by mouth 2 (two) times daily before a meal.   polyethylene glycol packet Commonly known as:  MIRALAX / GLYCOLAX Take 17 g by mouth every other day.   potassium chloride 10 MEQ tablet Commonly known as:  K-DUR,KLOR-CON Take 10 mEq by mouth daily.   sennosides-docusate sodium 8.6-50 MG tablet Commonly known as:  SENOKOT-S Take 2 tablets by mouth at bedtime. For constipation       Review of Systems  Constitutional: Negative for activity change, appetite change, chills, diaphoresis, fatigue,  fever and unexpected weight change.  HENT: Positive for trouble swallowing. Negative for congestion.   Eyes: Positive for visual disturbance.  Respiratory: Positive for cough. Negative for shortness of breath and wheezing.   Cardiovascular: Negative for chest pain, palpitations and leg swelling.  Gastrointestinal: Negative for abdominal distention, abdominal pain, constipation and diarrhea.  Genitourinary: Negative for difficulty urinating and dysuria.  Musculoskeletal: Positive for arthralgias and gait problem. Negative for back pain, joint swelling and myalgias.  Neurological: Positive for weakness. Negative for dizziness, tremors, seizures, syncope, facial asymmetry, speech difficulty, light-headedness, numbness and headaches.  Psychiatric/Behavioral: Positive for agitation, behavioral problems, confusion and hallucinations. The patient is nervous/anxious.     Immunization History  Administered Date(s) Administered  . Influenza Inj Mdck Quad Pf 02/03/2016  . Influenza Whole 01/24/2012, 01/29/2013  . Influenza-Unspecified 01/20/2014, 02/05/2015  . PPD Test 10/10/2011  .  Pneumococcal Polysaccharide-23 02/10/2004   Pertinent  Health Maintenance Due  Topic Date Due  . DEXA SCAN  01/28/1984  . PNA vac Low Risk Adult (2 of 2 - PCV13) 02/09/2005  . INFLUENZA VACCINE  Completed   No flowsheet data found. Functional Status Survey:    Vitals:   04/20/16 1629  BP: (!) 164/65  Pulse: 61  Resp: 20  Temp: 97.5 F (36.4 C)  SpO2: 95%  Weight: 160 lb (72.6 kg)   Body mass index is 31.25 kg/m.  Wt Readings from Last 3 Encounters:  04/20/16 160 lb (72.6 kg)  03/13/16 159 lb (72.1 kg)  02/10/16 168 lb 6.4 oz (76.4 kg)   Physical Exam  Constitutional: No distress.  HENT:  Head: Normocephalic and atraumatic.  Nose: Nose normal.  Mouth/Throat: Oropharynx is clear and moist. No oropharyngeal exudate.  Very dry mouth  Eyes: Conjunctivae are normal. Right eye exhibits no discharge. Left eye exhibits no discharge.  Poor vision. HOH.  Left pupil irregular but reactive. Right pupil reactive   Neck: No JVD present.  Cardiovascular: Normal rate and regular rhythm.   No murmur heard. Pulmonary/Chest: Effort normal. No respiratory distress. She has no wheezes. She has no rales.  Intermittent rhonchi bilat  Abdominal: Soft. Bowel sounds are normal. She exhibits no distension.  Musculoskeletal: She exhibits no edema or tenderness.  Neurological: She is alert.  Oriented to self, difficulty following commands  Skin: Skin is warm and dry. She is not diaphoretic.  Psychiatric: She has a normal mood and affect.  Nursing note and vitals reviewed.   Labs reviewed:  Recent Labs  09/09/15 02/10/16  NA 141 142  K 4.2 5.3  BUN 20 19  CREATININE 0.8 1.0    Recent Labs  02/10/16  AST 29  ALT 31  ALKPHOS 74    Recent Labs  09/09/15 11/16/15 0516 02/10/16  WBC 5.1 11.8 5.8  HGB 13.1 14.6 15.2  HCT 44 44 47*  PLT 252 256 233   Lab Results  Component Value Date   TSH 1.27 04/06/2015   No results found for: HGBA1C Lab Results  Component Value Date    CHOL 171 08/07/2013   HDL 42 08/07/2013   LDLCALC 109 08/07/2013   TRIG 102 08/07/2013    Significant Diagnostic Results in last 30 days:  No results found.  Assessment/Plan  1. Chronic nonseasonal allergic rhinitis due to other allergen D/c atrovent due to dry mouth and monitor symptoms Continue claritin 10 mg qd  2. Essential hypertension Continue to monitor, would not treat unless consistently >160 due to goals of care  3. Vascular dementia  with behavior disturbance Worsening memory loss and agitation Continue ativan, consider seroquel if symptoms persist  4. Esophageal dysphagia Coughing with meals, no tube feeding or hospitalizations Continue puree diet with NTL  5. Xerostomia Biotene spray 2 sprays to mouth TID  6. Edema D/C lasix and monitor SOB/edema Due to poor intake and dry mouth    Family/ staff Communication: discussed with hospice and nurse  Labs/tests ordered:  NA, followed by hospice

## 2016-05-19 ENCOUNTER — Non-Acute Institutional Stay (SKILLED_NURSING_FACILITY): Payer: Medicare Other | Admitting: Adult Health

## 2016-05-19 ENCOUNTER — Encounter: Payer: Self-pay | Admitting: Adult Health

## 2016-05-19 DIAGNOSIS — R1310 Dysphagia, unspecified: Secondary | ICD-10-CM | POA: Diagnosis not present

## 2016-05-19 DIAGNOSIS — F0151 Vascular dementia with behavioral disturbance: Secondary | ICD-10-CM

## 2016-05-19 DIAGNOSIS — I10 Essential (primary) hypertension: Secondary | ICD-10-CM

## 2016-05-19 DIAGNOSIS — F411 Generalized anxiety disorder: Secondary | ICD-10-CM | POA: Diagnosis not present

## 2016-05-19 DIAGNOSIS — R682 Dry mouth, unspecified: Secondary | ICD-10-CM | POA: Diagnosis not present

## 2016-05-19 DIAGNOSIS — K117 Disturbances of salivary secretion: Secondary | ICD-10-CM

## 2016-05-19 DIAGNOSIS — R1319 Other dysphagia: Secondary | ICD-10-CM

## 2016-05-19 DIAGNOSIS — F01518 Vascular dementia, unspecified severity, with other behavioral disturbance: Secondary | ICD-10-CM

## 2016-05-19 NOTE — Progress Notes (Signed)
Location:  Occupational psychologist of Service:  SNF (31) Provider:   Cindi Carbon, ANP [place] [phone]   REED, Jonelle Sidle, DO  Patient Care Team: Gayland Curry, DO as PCP - General (Geriatric Medicine) Well [facility] [place] Campbell, MD as Consulting Physician (Gastroenterology) Melina Schools, MD as Consulting Physician (Orthopedic Surgery)  Extended Emergency Contact Information Primary Emergency Contact: [facility] Phone: [phone] Relation: None Secondary Emergency Contact: [facility] Address: [address]          [place]  [place] Home Phone: [phone] Relation: None  Code Status:  DNR Goals of care: Advanced Directive information Advanced Directives 04/20/2016  Does Patient Have a Medical Advance Directive? Yes  Type of Advance Directive Out of facility DNR (pink MOST or yellow form);Living will  Copy of [place] in Chart? Yes  Pre-existing out of facility DNR order (yellow form or pink MOST form) Yellow form placed in chart (order not valid for inpatient use);Pink MOST form placed in chart (order not valid for inpatient use)  Some encounter information is confidential and restricted. Go to Review Flowsheets activity to see all data.     Chief Complaint  Patient presents with  . Medical Management of Chronic Issues    HPI:  Pt is a 83 y.o. female seen today for medical management of chronic diseases.  She is currently under hospice care due to dementia with probable CVA, dysphagia and aspiration.  Currently on puree diet with NTL, no new bouts of pna, cough, choking, etc. Has periodic hallucinations and anxiety, worse in the evening.  Periods of anxiety that are chronic but have been worse over the past few months. She is afraid of being alone. Her family has hired caretakers, this has helped to a degree. She has used prn ativan 5 x in the past two weeks.   Reports  intermittent leg pain, uses tylenol BID scheduled. Hx of OA to the knees.  Has not used prn morphine.   Her appetite has improved, she is up 6 lbs from last month.  No increased edema. BP range 110-175/67-89 Past Medical History:  Diagnosis Date  . Abnormality of gait 2007  . Allergic rhinitis 07/23/2012  . Anemia, unspecified 2005  . Anxiety state, unspecified 2005  . Basal cell carcinoma, face 03/20/2013  . Closed fracture of sternum 2006  . Closed fracture of two ribs 2013   left 5,6th  . Depression   . Depression with anxiety   . Depressive disorder, not elsewhere classified 2005  . Disturbance of salivary secretion 2007  . Dysphagia    with chronic aspiration  . External hemorrhoids without mention of complication 0000000  . GERD (gastroesophageal reflux disease) 2005  . Hyperglycemia   . Hypertonicity of bladder 2006  . Hyponatremia   . Insomnia, unspecified 2005  . Malignant neoplasm of breast (female), unspecified site 73  . Neoplasm of unspecified nature of breast 2000  . Osteoarthrosis, unspecified whether generalized or localized, unspecified site 2005  . Other and unspecified hyperlipidemia 2005  . Other seborrheic dermatitis 2007  . Pain in joint, shoulder region 2008  . Pain in joint, shoulder region 10/21/2012  . Senile cataract, unspecified 2005  . Senile osteoporosis 2000  . Subdural hemorrhage ([place]) 2007  . Unspecified essential hypertension 2005  . Unspecified malignant neoplasm of skin of other and unspecified parts of face 2012  . Unspecified urinary incontinence 2005  . Unspecified venous (peripheral) insufficiency 2001  .  Urinary tract infection, site not specified 2013  . Vertebral fracture, closed 2006  . Vitamin D deficiency 2009   Past Surgical History:  Procedure Laterality Date  . ABDOMINAL HYSTERECTOMY    . APPENDECTOMY    . BREAST SURGERY Right    Mastectomy  . CYST EXCISION Left 2011   Calcified cyst L shin  . ESOPHAGOGASTRODUODENOSCOPY  ENDOSCOPY  07/11/2013   Dr. Watt Climes hiatus hernia  . INTRAMEDULLARY (IM) NAIL INTERTROCHANTERIC Right 02/15/2014   Procedure: INTRAMEDULLARY (IM) NAIL INTERTROCHANTRIC;  Surgeon: Melina Schools, MD;  Location: [facility];  Service: Orthopedics;  Laterality: Right;  . JOINT REPLACEMENT Left    Knee  . MOHS SURGERY  2010   Nose    Allergies  Allergen Reactions  . Other     "Epidural medication" per NH record  . Sulfa Antibiotics     Allergies as of 05/19/2016      Reactions   Other    "Epidural medication" per NH record   Sulfa Antibiotics       Medication List       Accurate as of 05/19/16 12:14 PM. Always use your most recent med list.          acetaminophen 325 MG tablet Commonly known as:  TYLENOL Take 650 mg by mouth 2 (two) times daily. May have additional 650mg  daily as needed for pain relief   aspirin EC 81 MG tablet Take 81 mg by mouth daily.   BIOTENE DRY MOUTH MT Use as directed 2 sprays in the mouth or throat 3 (three) times daily.   carboxymethylcellulose 0.5 % Soln Commonly known as:  REFRESH PLUS Place 2 drops into both eyes 2 (two) times daily as needed.   DULoxetine 60 MG capsule Commonly known as:  CYMBALTA Take 60 mg by mouth daily.   hydrocortisone cream 1 % Apply 1 application topically. Apply as needed for itching   ipratropium-albuterol 0.5-2.5 (3) MG/3ML Soln Commonly known as:  DUONEB Take 3 mLs by nebulization every 6 (six) hours as needed (wheezing).   loratadine 10 MG tablet Commonly known as:  CLARITIN Take 10 mg by mouth daily.   LORazepam 1 MG tablet Commonly known as:  ATIVAN 0.5mg  every morning and 1 mg at lunch and dinner and 1 mg twice daily as needed anxiety   Melatonin 5 MG Tabs Take 1 tablet by mouth at bedtime. For insomnia   metoprolol succinate 25 MG 24 hr tablet Commonly known as:  TOPROL-XL Take 25 mg by mouth at bedtime. For HTN   morphine 20 MG/ML concentrated solution Commonly known as:  ROXANOL Take 5 mg by  mouth every 6 (six) hours as needed for severe pain.   omeprazole 20 MG capsule Commonly known as:  PRILOSEC Take 20 mg by mouth 2 (two) times daily before a meal.   polyethylene glycol packet Commonly known as:  MIRALAX / GLYCOLAX Take 17 g by mouth every other day.   sennosides-docusate sodium 8.6-50 MG tablet Commonly known as:  SENOKOT-S Take 2 tablets by mouth at bedtime. For constipation       Review of Systems  Constitutional: Negative for activity change, appetite change, chills, diaphoresis, fatigue, fever and unexpected weight change.  HENT: Positive for trouble swallowing. Negative for congestion.   Eyes: Positive for visual disturbance.  Respiratory: Positive for cough. Negative for shortness of breath and wheezing.   Cardiovascular: Negative for chest pain, palpitations and leg swelling.  Gastrointestinal: Negative for abdominal distention, abdominal pain, constipation and diarrhea.  Genitourinary: Negative for difficulty urinating and dysuria.  Musculoskeletal: Positive for arthralgias and gait problem. Negative for back pain, joint swelling and myalgias.  Neurological: Positive for weakness. Negative for dizziness, tremors, seizures, syncope, facial asymmetry, speech difficulty, light-headedness, numbness and headaches.  Psychiatric/Behavioral: Positive for agitation, behavioral problems, confusion and hallucinations. The patient is nervous/anxious.     Immunization History  Administered Date(s) Administered  . Influenza Inj Mdck Quad Pf 02/03/2016  . Influenza Whole 01/24/2012, 01/29/2013  . Influenza-Unspecified 01/20/2014, 02/05/2015  . PPD Test 10/10/2011  . Pneumococcal Polysaccharide-23 02/10/2004   Pertinent  Health Maintenance Due  Topic Date Due  . DEXA SCAN  01/28/1984  . PNA vac Low Risk Adult (2 of 2 - PCV13) 02/09/2005  . INFLUENZA VACCINE  Completed   No flowsheet data found. Functional Status Survey:    Vitals:   06/06/16 0754  BP: (!)  171/80  Pulse: 77  Resp: 20  Temp: 97.4 F (36.3 C)  SpO2: 93%  Weight: 166 lb 8 oz (75.5 kg)   There is no height or weight on file to calculate BMI.  Wt Readings from Last 3 Encounters:  06/06/16 166 lb 8 oz (75.5 kg)  04/20/16 160 lb (72.6 kg)  03/13/16 159 lb (72.1 kg)   Physical Exam  Constitutional: No distress.  HENT:  Head: Normocephalic and atraumatic.  Nose: Nose normal.  Mouth/Throat: Oropharynx is clear and moist. No oropharyngeal exudate.  Eyes: Conjunctivae are normal. Right eye exhibits no discharge. Left eye exhibits no discharge.  Poor vision. HOH.  Left pupil irregular but reactive. Right pupil reactive   Neck: No JVD present.  Cardiovascular: Normal rate and regular rhythm.   No murmur heard. Pulmonary/Chest: Effort normal and breath sounds normal. No respiratory distress. She has no wheezes. She has no rales.  Abdominal: Soft. Bowel sounds are normal. She exhibits no distension.  Musculoskeletal: She exhibits no edema or tenderness.  Neurological: She is alert.  Oriented to self, difficulty following commands  Skin: Skin is warm and dry. She is not diaphoretic.  Psychiatric: She has a normal mood and affect.  Nursing note and vitals reviewed.   Labs reviewed:  Recent Labs  09/09/15 02/10/16  NA 141 142  K 4.2 5.3  BUN 20 19  CREATININE 0.8 1.0    Recent Labs  02/10/16  AST 29  ALT 31  ALKPHOS 74    Recent Labs  09/09/15 11/16/15 0516 02/10/16  WBC 5.1 11.8 5.8  HGB 13.1 14.6 15.2  HCT 44 44 47*  PLT 252 256 233   Lab Results  Component Value Date   TSH 1.27 04/06/2015   No results found for: HGBA1C Lab Results  Component Value Date   CHOL 171 08/07/2013   HDL 42 08/07/2013   LDLCALC 109 08/07/2013   TRIG 102 08/07/2013    Significant Diagnostic Results in last 30 days:  No results found.  Assessment/Plan 1. Vascular dementia with behavior disturbance Continues with hallucinations but they do not seem to upset her,  although being alone does cause much anxiety for her Continue ativan scheduled and as needed Consider serqouel  2. Xerostomia Improved with biotene spray and off lasix  3. Esophageal dysphagia Improved with downgraded diet Clear lungs on exam today Followed by hospice  4. Essential hypertension Elevated at times, may be due to anxiety No aggressive measures due to goals of care and age  50. Anxiety state Continues to be an issue See above Continue cymbalta 60 mg qd  Family/ staff Communication: discussed with hospice and nurse  Labs/tests ordered:  NA, followed by hospice

## 2016-06-06 ENCOUNTER — Non-Acute Institutional Stay (SKILLED_NURSING_FACILITY): Payer: Medicare Other | Admitting: Internal Medicine

## 2016-06-06 ENCOUNTER — Encounter: Payer: Self-pay | Admitting: Adult Health

## 2016-06-06 ENCOUNTER — Encounter: Payer: Self-pay | Admitting: Internal Medicine

## 2016-06-06 DIAGNOSIS — F0151 Vascular dementia with behavioral disturbance: Secondary | ICD-10-CM

## 2016-06-06 DIAGNOSIS — F28 Other psychotic disorder not due to a substance or known physiological condition: Secondary | ICD-10-CM

## 2016-06-06 DIAGNOSIS — I10 Essential (primary) hypertension: Secondary | ICD-10-CM

## 2016-06-06 DIAGNOSIS — F01518 Vascular dementia, unspecified severity, with other behavioral disturbance: Secondary | ICD-10-CM

## 2016-06-06 NOTE — Progress Notes (Signed)
Patient ID: [name], female   DOB: Jan 06, 1917, 83 y.o.   MRN: [id number]  Location:  [facility] Room Number: [id number] Place of Service:  SNF (31) Provider:  Samayah Novinger L. Mariea Clonts, D.O., C.M.D.  Hollace Kinnier, DO  Patient Care Team: Gayland Curry, DO as PCP - General (Geriatric Medicine) [facility] [place] Campbell, MD as Consulting Physician (Gastroenterology) Melina Schools, MD as Consulting Physician (Orthopedic Surgery)  Extended Emergency Contact Information Primary Emergency Contact: [facility] Phone: [phone] Relation: None Secondary Emergency Contact: [facility] Address: [address]          [place]  [place] Home Phone: [phone] Relation: None  Code Status:  DNR Goals of care: Advanced Directive information Advanced Directives 06/06/2016  Does Patient Have a Medical Advance Directive? Yes  Type of Advance Directive Out of facility DNR (pink MOST or yellow form);[place] in Chart? Yes  Pre-existing out of facility DNR order (yellow form or pink MOST form) Yellow form placed in chart (order not valid for inpatient use);Pink MOST form placed in chart (order not valid for inpatient use)  Some encounter information is confidential and restricted. Go to Review Flowsheets activity to see all data.   Chief Complaint  Patient presents with  . Acute Visit    hallucinations    HPI:  Pt is a 83 y.o. female seen today for an acute visit for hypertension (three BPs increased, others satisfactory), increased hallucinations, agitation and confusion.  She is currently getting weekly bp checks.  She is on hospice care due to recurrent aspiration, dysphagia from esophageal dysmotility, dementia, failure to thrive.  She's had a prior TIA.  She also has significant anxiety and depression.  Her hallucinations involved seeing some children in her room.   She's had slow pupillary responses and seemed to have had 2 possible seizure episodes today with shaking of all of her extremities.  BPs:  176/86, 180/82, 190/96 manually.  HR wnl.  Sats 93%.  She's been having the hallucinations off and on for a while, but is not taking any antipsychotics.    When seen, she was very confused, seemed to still be hallucinating--reaching for the ceiling like something was there.  Her speech was more difficult to understand than usual.  She kept asking about going to her hair appt which had been rescheduled due to her condition today.     Past Medical History:  Diagnosis Date  . Abnormality of gait 2007  . Allergic rhinitis 07/23/2012  . Anemia, unspecified 2005  . Anxiety state, unspecified 2005  . Basal cell carcinoma, face 03/20/2013  . Closed fracture of sternum 2006  . Closed fracture of two ribs 2013   left 5,6th  . Depression   . Depression with anxiety   . Depressive disorder, not elsewhere classified 2005  . Disturbance of salivary secretion 2007  . Dysphagia    with chronic aspiration  . External hemorrhoids without mention of complication 0000000  . GERD (gastroesophageal reflux disease) 2005  . Hyperglycemia   . Hypertonicity of bladder 2006  . Hyponatremia   . Insomnia, unspecified 2005  . Malignant neoplasm of breast (female), unspecified site 8  . Neoplasm of unspecified nature of breast 2000  . Osteoarthrosis, unspecified whether generalized or localized, unspecified site 2005  . Other and unspecified hyperlipidemia 2005  . Other seborrheic dermatitis 2007  . Pain in joint, shoulder region 2008  . Pain in  joint, shoulder region 10/21/2012  . Senile cataract, unspecified 2005  . Senile osteoporosis 2000  . Subdural hemorrhage ([place]) 2007  . Unspecified essential hypertension 2005  . Unspecified malignant neoplasm of skin of other and unspecified parts of face 2012  . Unspecified urinary incontinence 2005  . Unspecified venous  (peripheral) insufficiency 2001  . Urinary tract infection, site not specified 2013  . Vertebral fracture, closed 2006  . Vitamin D deficiency 2009   Past Surgical History:  Procedure Laterality Date  . ABDOMINAL HYSTERECTOMY    . APPENDECTOMY    . BREAST SURGERY Right    Mastectomy  . CYST EXCISION Left 2011   Calcified cyst L shin  . ESOPHAGOGASTRODUODENOSCOPY ENDOSCOPY  07/11/2013   Dr. Watt Climes hiatus hernia  . INTRAMEDULLARY (IM) NAIL INTERTROCHANTERIC Right 02/15/2014   Procedure: INTRAMEDULLARY (IM) NAIL INTERTROCHANTRIC;  Surgeon: Melina Schools, MD;  Location: [facility];  Service: Orthopedics;  Laterality: Right;  . JOINT REPLACEMENT Left    Knee  . MOHS SURGERY  2010   Nose    Allergies  Allergen Reactions  . Other     "Epidural medication" per NH record  . Sulfa Antibiotics     Allergies as of 06/06/2016      Reactions   Other    "Epidural medication" per NH record   Sulfa Antibiotics       Medication List       Accurate as of 06/06/16  2:03 PM. Always use your most recent med list.          acetaminophen 325 MG tablet Commonly known as:  TYLENOL Take 650 mg by mouth 2 (two) times daily. May have additional 650mg  daily as needed for pain relief   BIOTENE DRY MOUTH MT Use as directed 2 sprays in the mouth or throat 3 (three) times daily.   carboxymethylcellulose 0.5 % Soln Commonly known as:  REFRESH PLUS Place 2 drops into both eyes 2 (two) times daily as needed.   DULoxetine 60 MG capsule Commonly known as:  CYMBALTA Take 60 mg by mouth daily.   hydrocortisone cream 1 % Apply 1 application topically. Apply as needed for itching   ipratropium-albuterol 0.5-2.5 (3) MG/3ML Soln Commonly known as:  DUONEB Take 3 mLs by nebulization every 6 (six) hours as needed (wheezing).   loratadine 10 MG tablet Commonly known as:  CLARITIN Take 10 mg by mouth daily.   LORazepam 1 MG tablet Commonly known as:  ATIVAN 0.5mg  every morning and 1 mg at lunch and  dinner and 1 mg twice daily as needed anxiety   Melatonin 5 MG Tabs Take 1 tablet by mouth at bedtime. For insomnia   metoprolol succinate 25 MG 24 hr tablet Commonly known as:  TOPROL-XL Take 25 mg by mouth at bedtime. For HTN   morphine 20 MG/ML concentrated solution Commonly known as:  ROXANOL Take 5 mg by mouth every 6 (six) hours as needed for severe pain.   omeprazole 20 MG capsule Commonly known as:  PRILOSEC Take 20 mg by mouth 2 (two) times daily before a meal.   polyethylene glycol packet Commonly known as:  MIRALAX / GLYCOLAX Take 17 g by mouth every other day.   QUEtiapine 25 MG tablet Commonly known as:  SEROQUEL Take 12.5 mg by mouth at bedtime.   sennosides-docusate sodium 8.6-50 MG tablet Commonly known as:  SENOKOT-S Take 2 tablets by mouth at bedtime. For constipation       Review of Systems  Constitutional: Positive  for activity change, appetite change and fatigue. Negative for chills and fever.  HENT: Positive for congestion.   Eyes:       Glasses  Respiratory: Positive for cough. Negative for shortness of breath.   Cardiovascular: Negative for chest pain and leg swelling.  Gastrointestinal: Negative for abdominal pain and constipation.       Lge bm 2/19  Genitourinary: Negative for dysuria.  Musculoskeletal: Positive for arthralgias and gait problem.       Uses wheelchair  Neurological: Positive for tremors, seizures and weakness. Negative for dizziness.  Psychiatric/Behavioral: Positive for hallucinations. The patient is nervous/anxious.     Immunization History  Administered Date(s) Administered  . Influenza Inj Mdck Quad Pf 02/03/2016  . Influenza Whole 01/24/2012, 01/29/2013  . Influenza-Unspecified 01/20/2014, 02/05/2015  . PPD Test 10/10/2011  . Pneumococcal Polysaccharide-23 02/10/2004   Pertinent  Health Maintenance Due  Topic Date Due  . DEXA SCAN  01/28/1984  . PNA vac Low Risk Adult (2 of 2 - PCV13) 02/09/2005  . INFLUENZA  VACCINE  Completed    Vitals:   06/06/16 1402  BP: (!) 152/79  Pulse: 84  Resp: 20  Temp: 97.4 F (36.3 C)  TempSrc: Oral  SpO2: 93%   There is no height or weight on file to calculate BMI. Physical Exam  Constitutional: She appears well-developed. She appears distressed.  HENT:  Head: Normocephalic and atraumatic.  Cardiovascular: Normal rate, regular rhythm and normal heart sounds.   Pulmonary/Chest: Effort normal and breath sounds normal. No respiratory distress. She has no wheezes. She has no rales.  Abdominal: Soft. Bowel sounds are normal. She exhibits no distension and no mass. There is no tenderness. There is no rebound and no guarding.  Musculoskeletal: Normal range of motion.  Neurological: She is alert.  No shaking during visit  Skin: Skin is warm and dry.  Psychiatric:  Actively hallucinating and reaching toward something above her in the bed; also perseverating about her hair appt    Labs reviewed:  Recent Labs  09/09/15 02/10/16  NA 141 142  K 4.2 5.3  BUN 20 19  CREATININE 0.8 1.0    Recent Labs  02/10/16  AST 29  ALT 31  ALKPHOS 74    Recent Labs  09/09/15 11/16/15 0516 02/10/16  WBC 5.1 11.8 5.8  HGB 13.1 14.6 15.2  HCT 44 44 47*  PLT 252 256 233   Lab Results  Component Value Date   TSH 1.27 04/06/2015   No results found for: HGBA1C Lab Results  Component Value Date   CHOL 171 08/07/2013   HDL 42 08/07/2013   LDLCALC 109 08/07/2013   TRIG 102 08/07/2013   Assessment/Plan 1. Essential hypertension -bps have been elevated, pt may have had seizure activity today x 2--treated with ativan, but both episodes resolved spontaneously -pt's increased confusion and hallucinations may be due to postictal state or could be related to her dementia--not entirely clear -d/c weekly bp checks and change to monthly--regulating bp is intended to prolong life, cont toprol xl 25mg  at hs  2. Vascular dementia with behavior disturbance -no longer on  true dementia meds due to goals for comfort, pt on hospice care -d/c baby asa   3. Other psychotic disorder not due to substance or known physiological condition -suspect that her hallucinations are due to her dementia state b/c they are not brand new today, but going on for several weeks -she also has no signs of acute infection at present to cause a  delirium--also checking for this is not consistent with her goals of care -begin seroquel 12.5mg  only at hs to try to decrease severity of hallucinations which are distressing her at this point  Family/ staff Communication: discussed with snf nurse and nurse manager  Labs/tests ordered:  None due to goals of care

## 2016-06-29 ENCOUNTER — Non-Acute Institutional Stay (SKILLED_NURSING_FACILITY): Payer: Medicare Other | Admitting: Adult Health

## 2016-06-29 DIAGNOSIS — K5901 Slow transit constipation: Secondary | ICD-10-CM | POA: Diagnosis not present

## 2016-06-29 DIAGNOSIS — R1312 Dysphagia, oropharyngeal phase: Secondary | ICD-10-CM

## 2016-06-29 DIAGNOSIS — F411 Generalized anxiety disorder: Secondary | ICD-10-CM

## 2016-06-29 DIAGNOSIS — F01518 Vascular dementia, unspecified severity, with other behavioral disturbance: Secondary | ICD-10-CM

## 2016-06-29 DIAGNOSIS — R32 Unspecified urinary incontinence: Secondary | ICD-10-CM

## 2016-06-29 DIAGNOSIS — F0151 Vascular dementia with behavioral disturbance: Secondary | ICD-10-CM

## 2016-06-29 DIAGNOSIS — R569 Unspecified convulsions: Secondary | ICD-10-CM | POA: Diagnosis not present

## 2016-07-04 ENCOUNTER — Encounter: Payer: Self-pay | Admitting: Adult Health

## 2016-07-04 NOTE — Progress Notes (Addendum)
Location:  [facility] of Service:  SNF (31) Provider:   Cindi Carbon, ANP [place] [phone]   REED, Jonelle Sidle, DO  Patient Care Team: Gayland Curry, DO as PCP - General (Geriatric Medicine) [facility] [place] Campbell, MD as Consulting Physician (Gastroenterology) Melina Schools, MD as Consulting Physician (Orthopedic Surgery)  Extended Emergency Contact Information Primary Emergency Contact: [facility] Phone: [phone] Relation: None Secondary Emergency Contact: [facility] Address: [address]          [place]  [place] Phone: [phone] Relation: None  Code Status:  DNR Goals of care: Advanced Directive information Advanced Directives 06/06/2016  Does Patient Have a Medical Advance Directive? Yes  Type of Advance Directive Out of facility DNR (pink MOST or yellow form);[place] in Chart? Yes  Pre-existing out of facility DNR order (yellow form or pink MOST form) Yellow form placed in chart (order not valid for inpatient use);Pink MOST form placed in chart (order not valid for inpatient use)  Some encounter information is confidential and restricted. Go to Review Flowsheets activity to see all data.     Chief Complaint  Patient presents with  . Medical Management of Chronic Issues    HPI:  Pt is a 83 y.o. female seen today for medical management of chronic diseases.  She is currently under hospice care due to dementia with probable CVA, dysphagia and aspiration.  Currently on puree diet with NTL with occasional coughing with meals/liquids but no fevers, decreased sats, etc She was seen by Dr. Mariea Clonts on 2/20 due to two possible seizure episodes that were abated with ativan. Ativan is also used when she has anxiety and has been used 3-4 x in the past two weeks.  No new seizure activity.    BMs are regular per nurse, with  one loose BM today currently on senokot at bedtime.  She has incontinence and uses briefs.  No dysuria or bladder pain noted.  Chronic cough due to aspiration and allergic rhinitis seems better per staff She has a hx of hallucinations and was started on Seroquel in Feb. She continues to have hallucinations on occasion but this seems improved as well   Past Medical History:  Diagnosis Date  . Abnormality of gait 2007  . Allergic rhinitis 07/23/2012  . Anemia, unspecified 2005  . Anxiety state, unspecified 2005  . Basal cell carcinoma, face 03/20/2013  . Closed fracture of sternum 2006  . Closed fracture of two ribs 2013   left 5,6th  . Depression   . Depression with anxiety   . Depressive disorder, not elsewhere classified 2005  . Disturbance of salivary secretion 2007  . Dysphagia    with chronic aspiration  . External hemorrhoids without mention of complication 8938  . GERD (gastroesophageal reflux disease) 2005  . Hyperglycemia   . Hypertonicity of bladder 2006  . Hyponatremia   . Insomnia, unspecified 2005  . Malignant neoplasm of breast (female), unspecified site 22  . Neoplasm of unspecified nature of breast 2000  . Osteoarthrosis, unspecified whether generalized or localized, unspecified site 2005  . Other and unspecified hyperlipidemia 2005  . Other seborrheic dermatitis 2007  . Pain in joint, shoulder region 2008  . Pain in joint, shoulder region 10/21/2012  . Senile cataract, unspecified 2005  . Senile osteoporosis 2000  . Subdural hemorrhage ([place]) 2007  . Unspecified essential hypertension 2005  . Unspecified malignant neoplasm  of skin of other and unspecified parts of face 2012  . Unspecified urinary incontinence 2005  . Unspecified venous (peripheral) insufficiency 2001  . Urinary tract infection, site not specified 2013  . Vertebral fracture, closed 2006  . Vitamin D deficiency 2009   Past Surgical History:  Procedure Laterality Date  . ABDOMINAL HYSTERECTOMY     . APPENDECTOMY    . BREAST SURGERY Right    Mastectomy  . CYST EXCISION Left 2011   Calcified cyst L shin  . ESOPHAGOGASTRODUODENOSCOPY ENDOSCOPY  07/11/2013   Dr. Watt Climes hiatus hernia  . INTRAMEDULLARY (IM) NAIL INTERTROCHANTERIC Right 02/15/2014   Procedure: INTRAMEDULLARY (IM) NAIL INTERTROCHANTRIC;  Surgeon: Melina Schools, MD;  Location: [facility];  Service: Orthopedics;  Laterality: Right;  . JOINT REPLACEMENT Left    Knee  . MOHS SURGERY  2010   Nose    Allergies  Allergen Reactions  . Other     "Epidural medication" per NH record  . Sulfa Antibiotics     Allergies as of 06/29/2016      Reactions   Other    "Epidural medication" per NH record   Sulfa Antibiotics       Medication List       Accurate as of 06/29/16 11:59 PM. Always use your most recent med list.          acetaminophen 325 MG tablet Commonly known as:  TYLENOL Take 650 mg by mouth 2 (two) times daily. May have additional 650mg  daily as needed for pain relief   BIOTENE DRY MOUTH MT Use as directed 2 sprays in the mouth or throat 3 (three) times daily.   carboxymethylcellulose 0.5 % Soln Commonly known as:  REFRESH PLUS Place 2 drops into both eyes 2 (two) times daily as needed.   DULoxetine 60 MG capsule Commonly known as:  CYMBALTA Take 60 mg by mouth daily.   hydrocortisone cream 1 % Apply 1 application topically. Apply as needed for itching   ipratropium-albuterol 0.5-2.5 (3) MG/3ML Soln Commonly known as:  DUONEB Take 3 mLs by nebulization every 6 (six) hours as needed (wheezing).   loratadine 10 MG tablet Commonly known as:  CLARITIN Take 10 mg by mouth daily.   LORazepam 1 MG tablet Commonly known as:  ATIVAN 1 mg 2 (two) times daily.   Melatonin 5 MG Tabs Take 1 tablet by mouth at bedtime. For insomnia   metoprolol succinate 25 MG 24 hr tablet Commonly known as:  TOPROL-XL Take 25 mg by mouth at bedtime. For HTN   morphine 20 MG/ML concentrated solution Commonly known  as:  ROXANOL Take 5 mg by mouth every 6 (six) hours as needed for severe pain.   omeprazole 20 MG capsule Commonly known as:  PRILOSEC Take 20 mg by mouth 2 (two) times daily before a meal.   polyethylene glycol packet Commonly known as:  MIRALAX / GLYCOLAX Take 17 g by mouth every other day.   QUEtiapine 25 MG tablet Commonly known as:  SEROQUEL Take 12.5 mg by mouth at bedtime.   sennosides-docusate sodium 8.6-50 MG tablet Commonly known as:  SENOKOT-S Take 2 tablets by mouth at bedtime. For constipation       Review of Systems  Constitutional: Negative for activity change, appetite change, chills, diaphoresis, fatigue, fever and unexpected weight change.  HENT: Positive for trouble swallowing. Negative for congestion.   Eyes: Negative for visual disturbance.  Respiratory: Positive for cough. Negative for shortness of breath and wheezing.   Cardiovascular: Negative  for chest pain, palpitations and leg swelling.  Gastrointestinal: Negative for abdominal distention, abdominal pain, constipation and diarrhea.  Genitourinary: Negative for difficulty urinating and dysuria.  Musculoskeletal: Positive for arthralgias and gait problem. Negative for back pain, joint swelling and myalgias.  Neurological: Positive for weakness. Negative for dizziness, tremors, seizures, syncope, facial asymmetry, speech difficulty, light-headedness, numbness and headaches.  Psychiatric/Behavioral: Positive for agitation, confusion and hallucinations. Negative for behavioral problems. The patient is nervous/anxious.     Immunization History  Administered Date(s) Administered  . Influenza Inj Mdck Quad Pf 02/03/2016  . Influenza Whole 01/24/2012, 01/29/2013  . Influenza-Unspecified 01/20/2014, 02/05/2015  . PPD Test 10/10/2011  . Pneumococcal Polysaccharide-23 02/10/2004   Pertinent  Health Maintenance Due  Topic Date Due  . DEXA SCAN  01/28/1984  . PNA vac Low Risk Adult (2 of 2 - PCV13)  02/09/2005  . INFLUENZA VACCINE  Completed   No flowsheet data found. Functional Status Survey:    Vitals:   07/04/16 1540  BP: (!) 142/72  Pulse: 82  Resp: 18  Temp: 98.3 F (36.8 C)  SpO2: 93%  Weight: 162 lb 9.6 oz (73.8 kg)   Body mass index is 31.76 kg/m.  Wt Readings from Last 3 Encounters:  07/04/16 162 lb 9.6 oz (73.8 kg)  06/06/16 166 lb 8 oz (75.5 kg)  04/20/16 160 lb (72.6 kg)   Physical Exam  Constitutional: No distress.  HENT:  Head: Normocephalic and atraumatic.  Nose: Nose normal.  Mouth/Throat: Oropharynx is clear and moist. No oropharyngeal exudate.  Eyes: Conjunctivae are normal. Right eye exhibits no discharge. Left eye exhibits no discharge.  Poor vision. HOH.  Left pupil irregular but reactive. Right pupil reactive   Neck: No JVD present.  Cardiovascular: Normal rate and regular rhythm.   No murmur heard. Pulmonary/Chest: Effort normal and breath sounds normal. No respiratory distress. She has no wheezes. She has no rales.  Abdominal: Soft. Bowel sounds are normal. She exhibits no distension.  Musculoskeletal: She exhibits no edema or tenderness.  Neurological: She is alert.  Oriented to self, able to follow commands.  Pleasant  Skin: Skin is warm and dry. She is not diaphoretic.  Psychiatric: She has a normal mood and affect.  Nursing note and vitals reviewed.   Labs reviewed:  Recent Labs  09/09/15 02/10/16  NA 141 142  K 4.2 5.3  BUN 20 19  CREATININE 0.8 1.0    Recent Labs  02/10/16  AST 29  ALT 31  ALKPHOS 74    Recent Labs  09/09/15 11/16/15 0516 02/10/16  WBC 5.1 11.8 5.8  HGB 13.1 14.6 15.2  HCT 44 44 47*  PLT 252 256 233   Lab Results  Component Value Date   TSH 1.27 04/06/2015   No results found for: HGBA1C Lab Results  Component Value Date   CHOL 171 08/07/2013   HDL 42 08/07/2013   LDLCALC 109 08/07/2013   TRIG 102 08/07/2013    Significant Diagnostic Results in last 30 days:  No results  found.  Assessment/Plan  1. Vascular dementia with behavior disturbance Slight improvement in hallucinations Would continue seroquel at 12.5 mg qhs  2. Anxiety state Controlled  Continue scheduled and prn ativan Continue Cymbalta 60 mg qd  3. Urinary incontinence, unspecified type Incontinence are per staff  4. Dysphagia Oral and esophageal issues, goals of care are comfort based with no feeding tubes Followed by hospice Continue modified diet and asp prec  5. Slow transit constipation Continue senokot s  two tabs qhs and miralax Hold for loose stools  6. Seizures No new events May use prn ativan for this issue Most likely due to advanced dementia  Family/ staff Communication: discussed with caregiver and nurse on the unit  Labs/tests ordered:  NA, followed by hospice

## 2016-07-25 ENCOUNTER — Non-Acute Institutional Stay (SKILLED_NURSING_FACILITY): Payer: Medicare Other | Admitting: Internal Medicine

## 2016-07-25 ENCOUNTER — Encounter: Payer: Self-pay | Admitting: Internal Medicine

## 2016-07-25 DIAGNOSIS — F01518 Vascular dementia, unspecified severity, with other behavioral disturbance: Secondary | ICD-10-CM

## 2016-07-25 DIAGNOSIS — K5901 Slow transit constipation: Secondary | ICD-10-CM | POA: Diagnosis not present

## 2016-07-25 DIAGNOSIS — F0151 Vascular dementia with behavioral disturbance: Secondary | ICD-10-CM

## 2016-07-25 DIAGNOSIS — I10 Essential (primary) hypertension: Secondary | ICD-10-CM

## 2016-07-25 DIAGNOSIS — F411 Generalized anxiety disorder: Secondary | ICD-10-CM

## 2016-07-25 DIAGNOSIS — K224 Dyskinesia of esophagus: Secondary | ICD-10-CM

## 2016-07-25 DIAGNOSIS — F29 Unspecified psychosis not due to a substance or known physiological condition: Secondary | ICD-10-CM

## 2016-07-25 NOTE — Progress Notes (Signed)
Location:  [facility] Room Number: 117 Place of Service:  SNF (31)SNF Provider:  Chisom Aust L. Mariea Clonts, D.O., C.M.D.  Hollace Kinnier, DO  Patient Care Team: Gayland Curry, DO as PCP - General (Geriatric Medicine) Well [facility] Richmond Campbell, MD as Consulting Physician (Gastroenterology) Melina Schools, MD as Consulting Physician (Orthopedic Surgery)  Extended Emergency Contact Information Primary Emergency Contact: [facility] Phone: [phone] Relation: None Secondary Emergency Contact: [facility] Address: [address]          [place] Phone: [phone] Relation: None  Code Status:  DNR, hospice Goals of care: Advanced Directive information Advanced Directives 07/25/2016  Does Patient Have a Medical Advance Directive? Yes  Type of Advance Directive Out of facility DNR (pink MOST or yellow form);[place] in Chart? Yes  Pre-existing out of facility DNR order (yellow form or pink MOST form) Yellow form placed in chart (order not valid for inpatient use);Pink MOST form placed in chart (order not valid for inpatient use)  Some encounter information is confidential and restricted. Go to Review Flowsheets activity to see all data.     Chief Complaint  Patient presents with  . Medical Management of Chronic Issues    routine visit    HPI:  Pt is a 83 y.o. female seen today for medical management of chronic diseases.   She lives in skilled care due to stroke, dementia, dysphagia with chronic aspiration, failure to thrive, hallucinations and depression.  She is dependent in ADLs and is on hospice care.    Hallucinations persist off and on.  She continues to not want to be left alone in her room.  She is actually seeing more things out the window that are not there and in the ceiling.    She still has difficulty with occasional  coughing with meals but no fevers, hypoxia.  She has had no further seizure activity.  Has chronic aspiration and allergic rhinitis.  Stable.    Bowels are moving well with current regimen.    She remains incontinent with depends.    Past Medical History:  Diagnosis Date  . Abnormality of gait 2007  . Allergic rhinitis 07/23/2012  . Anemia, unspecified 2005  . Anxiety state, unspecified 2005  . Basal cell carcinoma, face 03/20/2013  . Closed fracture of sternum 2006  . Closed fracture of two ribs 2013   left 5,6th  . Depression   . Depression with anxiety   . Depressive disorder, not elsewhere classified 2005  . Disturbance of salivary secretion 2007  . Dysphagia    with chronic aspiration  . External hemorrhoids without mention of complication 3295  . GERD (gastroesophageal reflux disease) 2005  . Hyperglycemia   . Hypertonicity of bladder 2006  . Hyponatremia   . Insomnia, unspecified 2005  . Malignant neoplasm of breast (female), unspecified site 72  . Neoplasm of unspecified nature of breast 2000  . Osteoarthrosis, unspecified whether generalized or localized, unspecified site 2005  . Other and unspecified hyperlipidemia 2005  . Other seborrheic dermatitis 2007  . Pain in joint, shoulder region 2008  . Pain in joint, shoulder region 10/21/2012  . Senile cataract, unspecified 2005  . Senile osteoporosis 2000  . Subdural hemorrhage ([place]) 2007  . Unspecified essential hypertension 2005  . Unspecified malignant neoplasm of skin of other and unspecified parts of face 2012  . Unspecified urinary incontinence 2005  . Unspecified venous (  peripheral) insufficiency 2001  . Urinary tract infection, site not specified 2013  . Vertebral fracture, closed 2006  . Vitamin D deficiency 2009   Past Surgical History:  Procedure Laterality Date  . ABDOMINAL HYSTERECTOMY    . APPENDECTOMY    . BREAST SURGERY Right    Mastectomy  . CYST EXCISION Left 2011   Calcified cyst L shin  .  ESOPHAGOGASTRODUODENOSCOPY ENDOSCOPY  07/11/2013   Dr. Watt Climes hiatus hernia  . INTRAMEDULLARY (IM) NAIL INTERTROCHANTERIC Right 02/15/2014   Procedure: INTRAMEDULLARY (IM) NAIL INTERTROCHANTRIC;  Surgeon: Melina Schools, MD;  Location: [facility];  Service: Orthopedics;  Laterality: Right;  . JOINT REPLACEMENT Left    Knee  . MOHS SURGERY  2010   Nose    Allergies  Allergen Reactions  . Other     "Epidural medication" per NH record  . Sulfa Antibiotics     Allergies as of 07/25/2016      Reactions   Other    "Epidural medication" per NH record   Sulfa Antibiotics       Medication List       Accurate as of 07/25/16  3:10 PM. Always use your most recent med list.          acetaminophen 325 MG tablet Commonly known as:  TYLENOL Take 650 mg by mouth 2 (two) times daily. May have additional 650mg  daily as needed for pain relief   AVEENO DAILY MOISTURIZER Lotn Apply topically 2 (two) times daily. To legs and feet   BIOTENE DRY MOUTH MT Use as directed 2 sprays in the mouth or throat 3 (three) times daily.   bisacodyl 10 MG suppository Commonly known as:  DULCOLAX Place 10 mg rectally as needed for moderate constipation.   carboxymethylcellulose 0.5 % Soln Commonly known as:  REFRESH PLUS Place 2 drops into both eyes 2 (two) times daily as needed.   DULoxetine 60 MG capsule Commonly known as:  CYMBALTA Take 60 mg by mouth daily.   hydrocortisone cream 1 % Apply 1 application topically. Apply as needed for itching   ipratropium-albuterol 0.5-2.5 (3) MG/3ML Soln Commonly known as:  DUONEB Take 3 mLs by nebulization every 6 (six) hours as needed (wheezing).   loratadine 10 MG tablet Commonly known as:  CLARITIN Take 10 mg by mouth daily.   LORazepam 1 MG tablet Commonly known as:  ATIVAN Take 1 mg by mouth 2 (two) times daily. At 1pm and 8pm and 1mg  twice daily for anxiety.   Melatonin 5 MG Tabs Take 1 tablet by mouth at bedtime. For insomnia   metoprolol succinate  25 MG 24 hr tablet Commonly known as:  TOPROL-XL Take 25 mg by mouth at bedtime. For HTN   morphine 20 MG/ML concentrated solution Commonly known as:  ROXANOL Take 5 mg by mouth every 6 (six) hours as needed for severe pain.   omeprazole 20 MG capsule Commonly known as:  PRILOSEC Take 20 mg by mouth 2 (two) times daily before a meal.   polyethylene glycol packet Commonly known as:  MIRALAX / GLYCOLAX Take 17 g by mouth every other day.   QUEtiapine 25 MG tablet Commonly known as:  SEROQUEL Take 12.5 mg by mouth at bedtime.   sennosides-docusate sodium 8.6-50 MG tablet Commonly known as:  SENOKOT-S Take 2 tablets by mouth at bedtime. For constipation       Review of Systems  Constitutional: Negative for chills, fever and malaise/fatigue.  HENT: Positive for hearing loss. Negative for congestion.  Eyes: Negative for blurred vision.       Glasses  Respiratory: Positive for cough and sputum production. Negative for shortness of breath.   Cardiovascular: Negative for chest pain, palpitations and leg swelling.  Gastrointestinal: Negative for abdominal pain, blood in stool, constipation, diarrhea and melena.  Genitourinary: Negative for dysuria.       Urinary incontinence  Musculoskeletal: Positive for falls, joint pain and myalgias.  Neurological: Negative for dizziness, loss of consciousness and weakness.  Psychiatric/Behavioral: Positive for depression, hallucinations and memory loss. The patient is nervous/anxious.     Immunization History  Administered Date(s) Administered  . Influenza Inj Mdck Quad Pf 02/03/2016  . Influenza Whole 01/24/2012, 01/29/2013  . Influenza-Unspecified 01/20/2014, 02/05/2015  . PPD Test 10/10/2011  . Pneumococcal Conjugate-13 11/25/2015  . Pneumococcal Polysaccharide-23 02/10/2004   Pertinent  Health Maintenance Due  Topic Date Due  . INFLUENZA VACCINE  11/15/2016  . PNA vac Low Risk Adult  Completed  . DEXA SCAN  Excluded   No  flowsheet data found. Functional Status Survey:    Vitals:   07/25/16 1333  BP: (!) 169/75  Pulse: 66  Resp: 16  Temp: 98 F (36.7 C)  TempSrc: Oral  SpO2: 95%  Weight: 165 lb (74.8 kg)   Body mass index is 32.22 kg/m. Physical Exam  Constitutional: No distress.  Cardiovascular: Normal rate, regular rhythm, normal heart sounds and intact distal pulses.   Pulmonary/Chest: Effort normal and breath sounds normal. No respiratory distress.  Abdominal: Soft. Bowel sounds are normal.  Musculoskeletal:  Decreased shoulder ROM bilaterally  Neurological: She is alert.  Skin: Skin is warm and dry.  Psychiatric:  anxious    Labs reviewed:  Recent Labs  09/09/15 02/10/16  NA 141 142  K 4.2 5.3  BUN 20 19  CREATININE 0.8 1.0    Recent Labs  02/10/16  AST 29  ALT 31  ALKPHOS 74    Recent Labs  09/09/15 11/16/15 0516 02/10/16  WBC 5.1 11.8 5.8  HGB 13.1 14.6 15.2  HCT 44 44 47*  PLT 252 256 233   Lab Results  Component Value Date   TSH 1.27 04/06/2015   No results found for: HGBA1C Lab Results  Component Value Date   CHOL 171 08/07/2013   HDL 42 08/07/2013   LDLCALC 109 08/07/2013   TRIG 102 08/07/2013    Assessment/Plan 1. Vascular dementia with behavior disturbance -not on memory meds, but is on seroquel for psychosis unclear if due to dementia or her depression at this point  2. Psychosis, unspecified psychosis type -some more frequent hallucinations that cause her to get anxious and upset per her caregiver--will increase seroquel to 25mg  po qhs -she is seeing things outside the window and up in the ceiling -cont on cymbalta for arthritis and back pain and depression also  3. Esophageal dysmotility -cont aspiration precautions and dietary modifications  4. Anxiety state -continues on her chronic ativan 1mg  po bid   5. Essential hypertension -bp satisfactory considering goals are comfort not life prolonging measures, only on toprolxl  6. Slow  transit constipation -cont on senna s and dulcolax supp if needed  Family/ staff Communication: discussed with snf nurse  Labs/tests ordered:  No new due to goals of care being comfort   Ananda Sitzer L. Hermione Havlicek, D.O. [facility] [address], [place] [address] Cell Phone (Mon-Fri 8am-5pm):  [phone] On Call:  [phone] & follow prompts after 5pm & weekends Office  Phone:  [phone] Office Fax:  [phone]

## 2016-08-02 ENCOUNTER — Non-Acute Institutional Stay (SKILLED_NURSING_FACILITY): Payer: Medicare Other

## 2016-08-02 DIAGNOSIS — Z0000 Encounter for general adult medical examination without abnormal findings: Secondary | ICD-10-CM | POA: Diagnosis not present

## 2016-08-02 NOTE — Progress Notes (Signed)
   I reviewed health advisor's note, was available for consultation and agree with the assessment and plan as written.  Pt on hospice care and cannot ambulate well for bone density.  tdap ok if family wants her to still have.  Earl Losee L. Ceil Roderick, D.O. [facility] [address], [place] [address] Cell Phone (Mon-Fri 8am-5pm):  [phone] On Call:  [phone] & follow prompts after 5pm & weekends Office Phone:  [phone] Office Fax:  [phone]   Quick Notes   Health Maintenance: TDAP and dexa due  Abnormal Screen: MMSE completed previously 4/30. Unable to do clock drawing  Patient Concerns:  None  Nurse Concerns:  None

## 2016-08-02 NOTE — Progress Notes (Signed)
Subjective:   [name] is a 83 y.o. female who presents for an Initial Medicare Annual Wellness Visit at [facility].   Cardiac Risk Factors include: advanced age (>19men, >60 women);dyslipidemia;family history of premature cardiovascular disease;obesity (BMI >30kg/m2);sedentary lifestyle;smoking/ tobacco exposure     Objective:    Today's Vitals   08/02/16 0901  BP: (!) 160/75  Pulse: 67  Temp: 98 F (36.7 C)  TempSrc: Oral  SpO2: 95%  Weight: 165 lb (74.8 kg)  Height: 5' (1.524 m)   Body mass index is 32.22 kg/m.   Current Medications (verified) Outpatient Encounter Prescriptions as of 08/02/2016  Medication Sig  . acetaminophen (TYLENOL) 325 MG tablet Take 650 mg by mouth 2 (two) times daily. May have additional 650mg  daily as needed for pain relief  . bisacodyl (DULCOLAX) 10 MG suppository Place 10 mg rectally as needed for moderate constipation.  . carboxymethylcellulose (REFRESH PLUS) 0.5 % SOLN Place 2 drops into both eyes 2 (two) times daily as needed.  . DULoxetine (CYMBALTA) 60 MG capsule Take 60 mg by mouth daily.  . hydrocortisone cream 1 % Apply 1 application topically. Apply as needed for itching  . ipratropium-albuterol (DUONEB) 0.5-2.5 (3) MG/3ML SOLN Take 3 mLs by nebulization every 6 (six) hours as needed (wheezing).  Marland Kitchen loratadine (CLARITIN) 10 MG tablet Take 10 mg by mouth daily.  Marland Kitchen LORazepam (ATIVAN) 1 MG tablet Take 1 mg by mouth 2 (two) times daily. At 1pm and 8pm and 1mg  twice daily for anxiety.  . Melatonin 5 MG TABS Take 1 tablet by mouth at bedtime. For insomnia  . metoprolol succinate (TOPROL-XL) 25 MG 24 hr tablet Take 25 mg by mouth at bedtime. For HTN  . morphine (ROXANOL) 20 MG/ML concentrated solution Take 5 mg by mouth every 6 (six) hours as needed for severe pain.  . Mouthwashes (BIOTENE DRY MOUTH MT) Use as directed 2 sprays in the mouth or throat 3 (three) times daily.  Marland Kitchen omeprazole (PRILOSEC) 20 MG capsule Take 20 mg by mouth 2  (two) times daily before a meal.  . polyethylene glycol (MIRALAX / GLYCOLAX) packet Take 17 g by mouth every other day.   . polyvinyl alcohol (LIQUIFILM TEARS) 1.4 % ophthalmic solution Place 2 drops into both eyes as needed for dry eyes.  Marland Kitchen QUEtiapine (SEROQUEL) 25 MG tablet Take 25 mg by mouth at bedtime.  . sennosides-docusate sodium (SENOKOT-S) 8.6-50 MG tablet Take 2 tablets by mouth at bedtime. For constipation  . Sunscreens (AVEENO DAILY MOISTURIZER) LOTN Apply topically 2 (two) times daily. To legs and feet   No facility-administered encounter medications on file as of 08/02/2016.     Allergies (verified) Other and Sulfa antibiotics   History: Past Medical History:  Diagnosis Date  . Abnormality of gait 2007  . Allergic rhinitis 07/23/2012  . Anemia, unspecified 2005  . Anxiety state, unspecified 2005  . Basal cell carcinoma, face 03/20/2013  . Closed fracture of sternum 2006  . Closed fracture of two ribs 2013   left 5,6th  . Depression   . Depression with anxiety   . Depressive disorder, not elsewhere classified 2005  . Disturbance of salivary secretion 2007  . Dysphagia    with chronic aspiration  . External hemorrhoids without mention of complication 1610  . GERD (gastroesophageal reflux disease) 2005  . Hyperglycemia   . Hypertonicity of bladder 2006  . Hyponatremia   . Insomnia, unspecified 2005  . Malignant neoplasm of breast (female), unspecified site 50  .  Neoplasm of unspecified nature of breast 2000  . Osteoarthrosis, unspecified whether generalized or localized, unspecified site 2005  . Other and unspecified hyperlipidemia 2005  . Other seborrheic dermatitis 2007  . Pain in joint, shoulder region 2008  . Pain in joint, shoulder region 10/21/2012  . Senile cataract, unspecified 2005  . Senile osteoporosis 2000  . Subdural hemorrhage ([place]) 2007  . Unspecified essential hypertension 2005  . Unspecified malignant neoplasm of skin of other and unspecified  parts of face 2012  . Unspecified urinary incontinence 2005  . Unspecified venous (peripheral) insufficiency 2001  . Urinary tract infection, site not specified 2013  . Vertebral fracture, closed 2006  . Vitamin D deficiency 2009   Past Surgical History:  Procedure Laterality Date  . ABDOMINAL HYSTERECTOMY    . APPENDECTOMY    . BREAST SURGERY Right    Mastectomy  . CYST EXCISION Left 2011   Calcified cyst L shin  . ESOPHAGOGASTRODUODENOSCOPY ENDOSCOPY  07/11/2013   Dr. Watt Climes hiatus hernia  . INTRAMEDULLARY (IM) NAIL INTERTROCHANTERIC Right 02/15/2014   Procedure: INTRAMEDULLARY (IM) NAIL INTERTROCHANTRIC;  Surgeon: Melina Schools, MD;  Location: [facility];  Service: Orthopedics;  Laterality: Right;  . JOINT REPLACEMENT Left    Knee  . MOHS SURGERY  2010   Nose   Family History  Problem Relation Age of Onset  . Heart disease Mother   . Pneumonia Father    Social History   Occupational History  . homemaker    Social History Main Topics  . Smoking status: Former Smoker    Quit date: 08/16/1950  . Smokeless tobacco: Never Used  . Alcohol use No  . Drug use: No  . Sexual activity: No    Tobacco Counseling Counseling given: Not Answered   Activities of Daily Living In your present state of health, do you have any difficulty performing the following activities: 08/02/2016  Hearing? Y  Vision? Y  Difficulty concentrating or making decisions? Y  Walking or climbing stairs? Y  Dressing or bathing? Y  Doing errands, shopping? Y  Preparing Food and eating ? Y  Using the Toilet? Y  In the past six months, have you accidently leaked urine? Y  Do you have problems with loss of bowel control? Y  Managing your Medications? Y  Managing your Finances? Y  Housekeeping or managing your Housekeeping? Y  Some recent data might be hidden    Immunizations and Health Maintenance Immunization History  Administered Date(s) Administered  . Influenza Inj Mdck Quad Pf 02/03/2016  .  Influenza Whole 01/24/2012, 01/29/2013  . Influenza-Unspecified 01/20/2014, 02/05/2015  . PPD Test 10/10/2011  . Pneumococcal Conjugate-13 11/25/2015  . Pneumococcal Polysaccharide-23 02/10/2004   There are no preventive care reminders to display for this patient.  Patient Care Team: Gayland Curry, DO as PCP - General (Geriatric Medicine) Well [facility] Richmond Campbell, MD as Consulting Physician (Gastroenterology) Melina Schools, MD as Consulting Physician (Orthopedic Surgery)  Indicate any recent [place] you may have received from other than [facility] providers in the past year (date may be approximate).     Assessment:   This is a routine wellness examination for [employer].   Hearing/Vision screen No exam data present  Dietary issues and exercise activities discussed: Current Exercise Habits: The patient does not participate in regular exercise at present, Exercise limited by: None identified  Goals    . Maintain Lifestyle          Starting 08/02/2016 I will maintain my  lifestyle.        Depression Screen PHQ 2/9 Scores 08/03/2016 07/25/2016  Exception Documentation Other- indicate reason in comment box Medical reason  Not completed Dementia -    Fall Risk Fall Risk  08/03/2016  Falls in the past year? No    Cognitive Function: MMSE - Mini Mental State Exam 08/02/2016 03/19/2016  Not completed: Unable to complete -  Orientation to time - 2  Orientation to Place - 1  Registration - 0  Attention/ Calculation - 0  Recall - 0  Language- name 2 objects - 0  Language- repeat - 0  Language- follow 3 step command - 0  Language- read & follow direction - 0  Write a sentence - 0  Copy design - 1  Total score - 4        Screening Tests Health Maintenance  Topic Date Due  . INFLUENZA VACCINE  11/15/2016  . PNA vac Low Risk Adult  Completed  . DEXA SCAN  Excluded  . TETANUS/TDAP  Excluded      Plan:    I have personally reviewed and  addressed the Medicare Annual Wellness questionnaire and have noted the following in the patient's chart:  A. Medical and social history B. Use of alcohol, tobacco or illicit drugs  C. Current medications and supplements D. Functional ability and status E.  Nutritional status F.  Physical activity G. Advance directives H. List of other physicians I.  Hospitalizations, surgeries, and ER visits in previous 12 months J.  [place] to include hearing, vision, cognitive, depression L. Referrals and appointments - none  In addition, I have reviewed and discussed with patient certain preventive protocols, quality metrics, and best practice recommendations. A written personalized care plan for preventive services as well as general preventive health recommendations were provided to patient.  See attached scanned questionnaire for additional information.   Signed,   Rich Reining, RN Nurse Health Advisor

## 2016-08-02 NOTE — Patient Instructions (Signed)
[name] , Thank you for taking time to come for your Medicare Wellness Visit. I appreciate your ongoing commitment to your health goals. Please review the following plan we discussed and let me know if I can assist you in the future.   Screening recommendations/referrals: Colonoscopy up to date Mammogram up to date Bone Density due Recommended yearly ophthalmology/optometry visit for glaucoma screening and checkup Recommended yearly dental visit for hygiene and checkup  Vaccinations: Influenza vaccine up to date Pneumococcal vaccine up to date Tdap vaccine due Shingles vaccine due  Advanced directives: In Chart  Conditions/risks identified: None  Next appointment: none upcoming   Preventive Care 65 Years and Older, Female Preventive care refers to lifestyle choices and visits with your health care provider that can promote health and wellness. What does preventive care include?  A yearly physical exam. This is also called an annual well check.  Dental exams once or twice a year.  Routine eye exams. Ask your health care provider how often you should have your eyes checked.  Personal lifestyle choices, including:  Daily care of your teeth and gums.  Regular physical activity.  Eating a healthy diet.  Avoiding tobacco and drug use.  Limiting alcohol use.  Practicing safe sex.  Taking low-dose aspirin every day.  Taking vitamin and mineral supplements as recommended by your health care provider. What happens during an annual well check? The services and screenings done by your health care provider during your annual well check will depend on your age, overall health, lifestyle risk factors, and family history of disease. Counseling  Your health care provider may ask you questions about your:  Alcohol use.  Tobacco use.  Drug use.  Emotional well-being.  Home and relationship well-being.  Sexual activity.  Eating habits.  History of falls.  Memory  and ability to understand (cognition).  Work and work Statistician.  Reproductive health. Screening  You may have the following tests or measurements:  Height, weight, and BMI.  Blood pressure.  Lipid and cholesterol levels. These may be checked every 5 years, or more frequently if you are over 3 years old.  Skin check.  Lung cancer screening. You may have this screening every year starting at age 81 if you have a 30-pack-year history of smoking and currently smoke or have quit within the past 15 years.  Fecal occult blood test (FOBT) of the stool. You may have this test every year starting at age 81.  Flexible sigmoidoscopy or colonoscopy. You may have a sigmoidoscopy every 5 years or a colonoscopy every 10 years starting at age 81.  Hepatitis C blood test.  Hepatitis B blood test.  Sexually transmitted disease (STD) testing.  Diabetes screening. This is done by checking your blood sugar (glucose) after you have not eaten for a while (fasting). You may have this done every 1-3 years.  Bone density scan. This is done to screen for osteoporosis. You may have this done starting at age 81.  Mammogram. This may be done every 1-2 years. Talk to your health care provider about how often you should have regular mammograms. Talk with your health care provider about your test results, treatment options, and if necessary, the need for more tests. Vaccines  Your health care provider may recommend certain vaccines, such as:  Influenza vaccine. This is recommended every year.  Tetanus, diphtheria, and acellular pertussis (Tdap, Td) vaccine. You may need a Td booster every 10 years.  Zoster vaccine. You may need this after age  81.  Pneumococcal 13-valent conjugate (PCV13) vaccine. One dose is recommended after age 81.  Pneumococcal polysaccharide (PPSV23) vaccine. One dose is recommended after age 81. Talk to your health care provider about which screenings and vaccines you need and  how often you need them. This information is not intended to replace advice given to you by your health care provider. Make sure you discuss any questions you have with your health care provider. Document Released: 04/30/2015 Document Revised: 12/22/2015 Document Reviewed: 02/02/2015 Elsevier Interactive Patient Education  2017 [place] Prevention in the Home Falls can cause injuries. They can happen to people of all ages. There are many things you can do to make your home safe and to help prevent falls. What can I do on the outside of my home?  Regularly fix the edges of walkways and driveways and fix any cracks.  Remove anything that might make you trip as you walk through a door, such as a raised step or threshold.  Trim any bushes or trees on the path to your home.  Use bright outdoor lighting.  Clear any walking paths of anything that might make someone trip, such as rocks or tools.  Regularly check to see if handrails are loose or broken. Make sure that both sides of any steps have handrails.  Any raised decks and porches should have guardrails on the edges.  Have any leaves, snow, or ice cleared regularly.  Use sand or salt on walking paths during winter.  Clean up any spills in your garage right away. This includes oil or grease spills. What can I do in the bathroom?  Use night lights.  Install grab bars by the toilet and in the tub and shower. Do not use towel bars as grab bars.  Use non-skid mats or decals in the tub or shower.  If you need to sit down in the shower, use a plastic, non-slip stool.  Keep the floor dry. Clean up any water that spills on the floor as soon as it happens.  Remove soap buildup in the tub or shower regularly.  Attach bath mats securely with double-sided non-slip rug tape.  Do not have throw rugs and other things on the floor that can make you trip. What can I do in the bedroom?  Use night lights.  Make sure that you  have a light by your bed that is easy to reach.  Do not use any sheets or blankets that are too big for your bed. They should not hang down onto the floor.  Have a firm chair that has side arms. You can use this for support while you get dressed.  Do not have throw rugs and other things on the floor that can make you trip. What can I do in the kitchen?  Clean up any spills right away.  Avoid walking on wet floors.  Keep items that you use a lot in easy-to-reach places.  If you need to reach something above you, use a strong step stool that has a grab bar.  Keep electrical cords out of the way.  Do not use floor polish or wax that makes floors slippery. If you must use wax, use non-skid floor wax.  Do not have throw rugs and other things on the floor that can make you trip. What can I do with my stairs?  Do not leave any items on the stairs.  Make sure that there are handrails on both sides of the stairs and  use them. Fix handrails that are broken or loose. Make sure that handrails are as long as the stairways.  Check any carpeting to make sure that it is firmly attached to the stairs. Fix any carpet that is loose or worn.  Avoid having throw rugs at the top or bottom of the stairs. If you do have throw rugs, attach them to the floor with carpet tape.  Make sure that you have a light switch at the top of the stairs and the bottom of the stairs. If you do not have them, ask someone to add them for you. What else can I do to help prevent falls?  Wear shoes that:  Do not have high heels.  Have rubber bottoms.  Are comfortable and fit you well.  Are closed at the toe. Do not wear sandals.  If you use a stepladder:  Make sure that it is fully opened. Do not climb a closed stepladder.  Make sure that both sides of the stepladder are locked into place.  Ask someone to hold it for you, if possible.  Clearly mark and make sure that you can see:  Any grab bars or  handrails.  First and last steps.  Where the edge of each step is.  Use tools that help you move around (mobility aids) if they are needed. These include:  Canes.  Walkers.  Scooters.  Crutches.  Turn on the lights when you go into a dark area. Replace any light bulbs as soon as they burn out.  Set up your furniture so you have a clear path. Avoid moving your furniture around.  If any of your floors are uneven, fix them.  If there are any pets around you, be aware of where they are.  Review your medicines with your doctor. Some medicines can make you feel dizzy. This can increase your chance of falling. Ask your doctor what other things that you can do to help prevent falls. This information is not intended to replace advice given to you by your health care provider. Make sure you discuss any questions you have with your health care provider. Document Released: 01/28/2009 Document Revised: 09/09/2015 Document Reviewed: 05/08/2014 Elsevier Interactive Patient Education  2017 [employer].

## 2016-08-21 ENCOUNTER — Encounter: Payer: Self-pay | Admitting: Adult Health

## 2016-08-21 ENCOUNTER — Non-Acute Institutional Stay (SKILLED_NURSING_FACILITY): Payer: Medicare Other | Admitting: Adult Health

## 2016-08-21 DIAGNOSIS — K219 Gastro-esophageal reflux disease without esophagitis: Secondary | ICD-10-CM

## 2016-08-21 DIAGNOSIS — R569 Unspecified convulsions: Secondary | ICD-10-CM | POA: Diagnosis not present

## 2016-08-21 DIAGNOSIS — F411 Generalized anxiety disorder: Secondary | ICD-10-CM | POA: Diagnosis not present

## 2016-08-21 DIAGNOSIS — F01518 Vascular dementia, unspecified severity, with other behavioral disturbance: Secondary | ICD-10-CM

## 2016-08-21 DIAGNOSIS — R1310 Dysphagia, unspecified: Secondary | ICD-10-CM

## 2016-08-21 DIAGNOSIS — F0151 Vascular dementia with behavioral disturbance: Secondary | ICD-10-CM

## 2016-08-21 DIAGNOSIS — I10 Essential (primary) hypertension: Secondary | ICD-10-CM | POA: Diagnosis not present

## 2016-08-21 DIAGNOSIS — R1319 Other dysphagia: Secondary | ICD-10-CM

## 2016-08-21 NOTE — Progress Notes (Signed)
Location:  Occupational psychologist of Service:  SNF (31) Provider:   Cindi Carbon, ANP [place] [phone]   Gayland Curry, DO  Patient Care Team: Gayland Curry, DO as PCP - General (Geriatric Medicine) [facility] Josue Hector, Dellis Filbert, MD as Consulting Physician (Gastroenterology) Melina Schools, MD as Consulting Physician (Orthopedic Surgery)  Extended Emergency Contact Information Primary Emergency Contact: [facility] Phone: [phone] Relation: None Secondary Emergency Contact: [facility] Address: [address]          [place]  [place] Phone: [phone] Relation: None  Code Status:  DNR Goals of care: Advanced Directive information Advanced Directives 08/21/2016  Does Patient Have a Medical Advance Directive? Yes  Type of Advance Directive Out of facility DNR (pink MOST or yellow form);Healthcare Power of Attorney  Does patient want to make changes to medical advance directive? -  Copy of [place] in Chart? Yes  Pre-existing out of facility DNR order (yellow form or pink MOST form) Pink MOST form placed in chart (order not valid for inpatient use);Yellow form placed in chart (order not valid for inpatient use)  Some encounter information is confidential and restricted. Go to Review Flowsheets activity to see all data.     Chief Complaint  Patient presents with  . Medical Management of Chronic Issues    HPI:  Pt is a 83 y.o. female seen today for medical management of chronic diseases.  She is currently under hospice care due to dementia with probable CVA, dysphagia and aspiration.  Currently on puree diet with NTL with occasional coughing with meals/liquids but no fevers, decreased sats, etc Hx of seizure activity but no new episodes or use of prn ativan She has a hx of hallucinations and was started on Seroquel in Feb. She continues to have hallucinations on  occasion but this seems improved as well Had generalized pain documented in matrix that required roxanol x 1 dose in the past two weeks Resident is less verbal over time with word finding issues. She has chronic anxiety and this tends to get worse when she is alone. She has a caretaker that comes and sits with her periods of the day and this seems to help. We are only monitoring her BP monthly due to goals of care and hospice status. Remains on Toprol 25 mg qd, BP range 155-173/66-71.     Past Medical History:  Diagnosis Date  . Abnormality of gait 2007  . Allergic rhinitis 07/23/2012  . Anemia, unspecified 2005  . Anxiety state, unspecified 2005  . Basal cell carcinoma, face 03/20/2013  . Closed fracture of sternum 2006  . Closed fracture of two ribs 2013   left 5,6th  . Depression   . Depression with anxiety   . Depressive disorder, not elsewhere classified 2005  . Disturbance of salivary secretion 2007  . Dysphagia    with chronic aspiration  . External hemorrhoids without mention of complication 2993  . GERD (gastroesophageal reflux disease) 2005  . Hyperglycemia   . Hypertonicity of bladder 2006  . Hyponatremia   . Insomnia, unspecified 2005  . Malignant neoplasm of breast (female), unspecified site 68  . Neoplasm of unspecified nature of breast 2000  . Osteoarthrosis, unspecified whether generalized or localized, unspecified site 2005  . Other and unspecified hyperlipidemia 2005  . Other seborrheic dermatitis 2007  . Pain in joint, shoulder region 2008  . Pain in joint, shoulder region 10/21/2012  . Senile cataract,  unspecified 2005  . Senile osteoporosis 2000  . Subdural hemorrhage ([place]) 2007  . Unspecified essential hypertension 2005  . Unspecified malignant neoplasm of skin of other and unspecified parts of face 2012  . Unspecified urinary incontinence 2005  . Unspecified venous (peripheral) insufficiency 2001  . Urinary tract infection, site not specified 2013  .  Vertebral fracture, closed 2006  . Vitamin D deficiency 2009   Past Surgical History:  Procedure Laterality Date  . ABDOMINAL HYSTERECTOMY    . APPENDECTOMY    . BREAST SURGERY Right    Mastectomy  . CYST EXCISION Left 2011   Calcified cyst L shin  . ESOPHAGOGASTRODUODENOSCOPY ENDOSCOPY  07/11/2013   Dr. Watt Climes hiatus hernia  . INTRAMEDULLARY (IM) NAIL INTERTROCHANTERIC Right 02/15/2014   Procedure: INTRAMEDULLARY (IM) NAIL INTERTROCHANTRIC;  Surgeon: Melina Schools, MD;  Location: [facility];  Service: Orthopedics;  Laterality: Right;  . JOINT REPLACEMENT Left    Knee  . MOHS SURGERY  2010   Nose    Allergies  Allergen Reactions  . Other     "Epidural medication" per [facility] record  . Sulfa Antibiotics     Allergies as of 08/21/2016      Reactions   Other    "Epidural medication" per NH record   Sulfa Antibiotics       Medication List       Accurate as of 08/21/16  9:36 AM. Always use your most recent med list.          acetaminophen 325 MG tablet Commonly known as:  TYLENOL Take 650 mg by mouth 2 (two) times daily. May have additional 650mg  daily as needed for pain relief   AVEENO DAILY MOISTURIZER Lotn Apply topically 2 (two) times daily. To legs and feet   BIOTENE DRY MOUTH MT Use as directed 2 sprays in the mouth or throat 3 (three) times daily.   bisacodyl 10 MG suppository Commonly known as:  DULCOLAX Place 10 mg rectally as needed for moderate constipation.   carboxymethylcellulose 0.5 % Soln Commonly known as:  REFRESH PLUS Place 2 drops into both eyes 2 (two) times daily.   DULoxetine 60 MG capsule Commonly known as:  CYMBALTA Take 60 mg by mouth daily.   hydrocortisone cream 1 % Apply 1 application topically. Apply as needed for itching   ipratropium-albuterol 0.5-2.5 (3) MG/3ML Soln Commonly known as:  DUONEB Take 3 mLs by nebulization every 6 (six) hours as needed (wheezing).   loratadine 10 MG tablet Commonly known as:  CLARITIN Take 10 mg by  mouth daily.   LORazepam 1 MG tablet Commonly known as:  ATIVAN Take 1 mg by mouth 2 (two) times daily. At 1pm and 8pm and 1mg  twice daily for anxiety. And 1 mg BID prn   Melatonin 5 MG Tabs Take 1 tablet by mouth at bedtime. For insomnia   metoprolol succinate 25 MG 24 hr tablet Commonly known as:  TOPROL-XL Take 25 mg by mouth at bedtime. For HTN   morphine 20 MG/ML concentrated solution Commonly known as:  ROXANOL Take 5 mg by mouth every 6 (six) hours as needed for severe pain.   omeprazole 20 MG capsule Commonly known as:  PRILOSEC Take 20 mg by mouth 2 (two) times daily before a meal.   polyethylene glycol packet Commonly known as:  MIRALAX / GLYCOLAX Take 17 g by mouth every other day.   polyvinyl alcohol 1.4 % ophthalmic solution Commonly known as:  LIQUIFILM TEARS Place 2 drops into both  eyes as needed for dry eyes.   QUEtiapine 25 MG tablet Commonly known as:  SEROQUEL Take 25 mg by mouth at bedtime.   sennosides-docusate sodium 8.6-50 MG tablet Commonly known as:  SENOKOT-S Take 2 tablets by mouth at bedtime. For constipation       Review of Systems  Constitutional: Negative for activity change, appetite change, chills, diaphoresis, fatigue, fever and unexpected weight change.  HENT: Positive for trouble swallowing. Negative for congestion.   Eyes: Negative for visual disturbance.  Respiratory: Positive for cough. Negative for shortness of breath and wheezing.   Cardiovascular: Negative for chest pain, palpitations and leg swelling.  Gastrointestinal: Negative for abdominal distention, abdominal pain, constipation and diarrhea.  Genitourinary: Negative for difficulty urinating and dysuria.  Musculoskeletal: Positive for arthralgias and gait problem. Negative for back pain, joint swelling and myalgias.  Neurological: Positive for weakness. Negative for dizziness, tremors, seizures, syncope, facial asymmetry, speech difficulty, light-headedness, numbness and  headaches.  Psychiatric/Behavioral: Positive for agitation, confusion and hallucinations. Negative for behavioral problems. The patient is nervous/anxious.     Immunization History  Administered Date(s) Administered  . Influenza Inj Mdck Quad Pf 02/03/2016  . Influenza Whole 01/24/2012, 01/29/2013  . Influenza-Unspecified 01/20/2014, 02/05/2015  . PPD Test 10/10/2011  . Pneumococcal Conjugate-13 11/25/2015  . Pneumococcal Polysaccharide-23 02/10/2004   Pertinent  Health Maintenance Due  Topic Date Due  . INFLUENZA VACCINE  11/15/2016  . PNA vac Low Risk Adult  Completed  . DEXA SCAN  Excluded   Fall Risk  08/03/2016  Falls in the past year? No   Functional Status Survey:    Vitals:   08/21/16 0935  Weight: 163 lb (73.9 kg)   Body mass index is 31.83 kg/m.  Wt Readings from Last 3 Encounters:  08/21/16 163 lb (73.9 kg)  08/02/16 165 lb (74.8 kg)  07/25/16 165 lb (74.8 kg)   Physical Exam  Constitutional: No distress.  HENT:  Head: Normocephalic and atraumatic.  Nose: Nose normal.  Mouth/Throat: Oropharynx is clear and moist. No oropharyngeal exudate.  Neck: No JVD present.  Cardiovascular: Normal rate and regular rhythm.   No murmur heard. Pulmonary/Chest: Effort normal. No respiratory distress. She has no wheezes. She has rales (both lungs mid back and down).  Abdominal: Soft. Bowel sounds are normal. She exhibits no distension.  Musculoskeletal: She exhibits no edema or tenderness.  Neurological: She is alert.  Oriented to self, able to follow commands.  Pleasant. Word finding issues  Skin: Skin is warm and dry. She is not diaphoretic.  Psychiatric: She has a normal mood and affect.  Nursing note and vitals reviewed.   Labs reviewed:  Recent Labs  09/09/15 02/10/16  NA 141 142  K 4.2 5.3  BUN 20 19  CREATININE 0.8 1.0    Recent Labs  02/10/16  AST 29  ALT 31  ALKPHOS 74    Recent Labs  09/09/15 11/16/15 0516 02/10/16  WBC 5.1 11.8 5.8  HGB  13.1 14.6 15.2  HCT 44 44 47*  PLT 252 256 233   Lab Results  Component Value Date   TSH 1.27 04/06/2015   No results found for: HGBA1C Lab Results  Component Value Date   CHOL 171 08/07/2013   HDL 42 08/07/2013   LDLCALC 109 08/07/2013   TRIG 102 08/07/2013    Significant Diagnostic Results in last 30 days:  No results found.  Assessment/Plan  1. Vascular dementia with behavior disturbance Improved behaviors Continue seroquel 25 mg qd No longer  on memory meds due to lack of benefit Has progressive word finding issues  2. Esophageal dysphagia Continue Puree diet with NTL Asp prec  3. Essential hypertension Elevated at times would not treat aggressively due to goals of care/hospice Continue toprol 25 mg XL qd  4. Generalized anxiety disorder Continue Cymbalta 60 mg qd and Ativan 1 mg BID  5. Gastroesophageal reflux disease without esophagitis Controlled Continue Prilosec 20 mg BID  6. Seizure activity Not noted for the past month Continue prn ativan  Family/ staff Communication: discussed with caregiver and nurse on the unit  Labs/tests ordered:  NA, followed by hospice

## 2016-09-14 ENCOUNTER — Encounter: Payer: Self-pay | Admitting: Adult Health

## 2016-09-14 ENCOUNTER — Non-Acute Institutional Stay (SKILLED_NURSING_FACILITY): Payer: Medicare Other | Admitting: Adult Health

## 2016-09-14 DIAGNOSIS — K5901 Slow transit constipation: Secondary | ICD-10-CM

## 2016-09-14 DIAGNOSIS — F411 Generalized anxiety disorder: Secondary | ICD-10-CM | POA: Diagnosis not present

## 2016-09-14 DIAGNOSIS — R1310 Dysphagia, unspecified: Secondary | ICD-10-CM | POA: Diagnosis not present

## 2016-09-14 DIAGNOSIS — R627 Adult failure to thrive: Secondary | ICD-10-CM

## 2016-09-14 DIAGNOSIS — R059 Cough, unspecified: Secondary | ICD-10-CM

## 2016-09-14 DIAGNOSIS — R05 Cough: Secondary | ICD-10-CM

## 2016-09-14 DIAGNOSIS — R1319 Other dysphagia: Secondary | ICD-10-CM

## 2016-09-14 NOTE — Progress Notes (Signed)
Location:  Occupational psychologist of Service:  SNF (31) Provider:   Cindi Carbon, ANP [place] [phone]   Gayland Curry, DO  Patient Care Team: Gayland Curry, DO as PCP - General (Geriatric Medicine) Community, [facility] Josue Hector, Dellis Filbert, MD as Consulting Physician (Gastroenterology) Melina Schools, MD as Consulting Physician (Orthopedic Surgery)  Extended Emergency Contact Information Primary Emergency Contact: [facility] Phone: [phone] Relation: None Secondary Emergency Contact: [facility] Address: [address]          [place]  [place] Home Phone: [phone] Relation: None  Code Status:  DNR Goals of care: Advanced Directive information Advanced Directives 09/14/2016  Does Patient Have a Medical Advance Directive? Yes  Type of Advance Directive Out of facility DNR (pink MOST or yellow form);Healthcare Power of Attorney  Does patient want to make changes to medical advance directive? -  Copy of [place] in Chart? Yes  Pre-existing out of facility DNR order (yellow form or pink MOST form) Pink MOST form placed in chart (order not valid for inpatient use);Yellow form placed in chart (order not valid for inpatient use)  Some encounter information is confidential and restricted. Go to Review Flowsheets activity to see all data.     Chief Complaint  Patient presents with  . Medical Management of Chronic Issues    HPI:  Pt is a 83 y.o. female seen today for medical management of chronic diseases.  She is currently under hospice care due to dementia with probable CVA, dysphagia and aspiration.  She was seen by the hospice nurse on 5/30 and most of her oral meds were discontinued due to dysphagia and decline. She was apparently SOB and lethargic as well.  She has had slow progressive decline in cognitive and swallowing function. Her goals of care are comfort based. On 5/30 she  had 2-3 doses of roxanol which seemed to help with sob and agitation. When I arrived this afternoon she was very anxious but could not verbalize what was bothering her. Her abd was distended as well but she denied any pain. No n, v, or d.  Her caregiver did not know if she had any wet briefs earlier in the day. She did have a BM but had gone 3 days without one prior to this BM. She has chronic anxiety and is on scheduled ativan and has not used any prn dosing.  Has increased cough with sputum production per the caregiver. No fever.  Currently on puree diet with NTL with occasional coughing with meals/liquids.   Past Medical History:  Diagnosis Date  . Abnormality of gait 2007  . Allergic rhinitis 07/23/2012  . Anemia, unspecified 2005  . Anxiety state, unspecified 2005  . Basal cell carcinoma, face 03/20/2013  . Closed fracture of sternum 2006  . Closed fracture of two ribs 2013   left 5,6th  . Depression   . Depression with anxiety   . Depressive disorder, not elsewhere classified 2005  . Disturbance of salivary secretion 2007  . Dysphagia    with chronic aspiration  . External hemorrhoids without mention of complication 1829  . GERD (gastroesophageal reflux disease) 2005  . Hyperglycemia   . Hypertonicity of bladder 2006  . Hyponatremia   . Insomnia, unspecified 2005  . Malignant neoplasm of breast (female), unspecified site 24  . Neoplasm of unspecified nature of breast 2000  . Osteoarthrosis, unspecified whether generalized or localized, unspecified site 2005  . Other and unspecified  hyperlipidemia 2005  . Other seborrheic dermatitis 2007  . Pain in joint, shoulder region 2008  . Pain in joint, shoulder region 10/21/2012  . Senile cataract, unspecified 2005  . Senile osteoporosis 2000  . Subdural hemorrhage ([place]) 2007  . Unspecified essential hypertension 2005  . Unspecified malignant neoplasm of skin of other and unspecified parts of face 2012  . Unspecified urinary  incontinence 2005  . Unspecified venous (peripheral) insufficiency 2001  . Urinary tract infection, site not specified 2013  . Vertebral fracture, closed 2006  . Vitamin D deficiency 2009   Past Surgical History:  Procedure Laterality Date  . ABDOMINAL HYSTERECTOMY    . APPENDECTOMY    . BREAST SURGERY Right    Mastectomy  . CYST EXCISION Left 2011   Calcified cyst L shin  . ESOPHAGOGASTRODUODENOSCOPY ENDOSCOPY  07/11/2013   Dr. Watt Climes hiatus hernia  . INTRAMEDULLARY (IM) NAIL INTERTROCHANTERIC Right 02/15/2014   Procedure: INTRAMEDULLARY (IM) NAIL INTERTROCHANTRIC;  Surgeon: Melina Schools, MD;  Location: [facility];  Service: Orthopedics;  Laterality: Right;  . JOINT REPLACEMENT Left    Knee  . MOHS SURGERY  2010   Nose    Allergies  Allergen Reactions  . Other     "Epidural medication" per NH record  . Sulfa Antibiotics     Allergies as of 09/14/2016      Reactions   Other    "Epidural medication" per NH record   Sulfa Antibiotics       Medication List       Accurate as of 09/14/16  4:05 PM. Always use your most recent med list.          AVEENO DAILY MOISTURIZER Lotn Apply topically 2 (two) times daily. To legs and feet   bisacodyl 10 MG suppository Commonly known as:  DULCOLAX Place 10 mg rectally as needed for moderate constipation.   carboxymethylcellulose 0.5 % Soln Commonly known as:  REFRESH PLUS Place 2 drops into both eyes 2 (two) times daily.   hydrocortisone cream 1 % Apply 1 application topically. Apply as needed for itching   ipratropium-albuterol 0.5-2.5 (3) MG/3ML Soln Commonly known as:  DUONEB Take 3 mLs by nebulization every 6 (six) hours as needed (wheezing).   LORazepam 1 MG tablet Commonly known as:  ATIVAN Take 1 mg by mouth 2 (two) times daily. At 1pm and 8pm and 1mg  twice daily for anxiety. And 1 mg BID prn   metoprolol tartrate 25 MG tablet Commonly known as:  LOPRESSOR Take 25 mg by mouth 2 (two) times daily.   morphine 20  MG/ML concentrated solution Commonly known as:  ROXANOL Take 5 mg by mouth every 4 (four) hours as needed for severe pain.   polyvinyl alcohol 1.4 % ophthalmic solution Commonly known as:  LIQUIFILM TEARS Place 2 drops into both eyes as needed for dry eyes.       Review of Systems  Constitutional: Positive for appetite change. Negative for activity change, chills, diaphoresis, fatigue, fever and unexpected weight change.  HENT: Positive for trouble swallowing. Negative for congestion, postnasal drip, rhinorrhea, sinus pressure and sore throat.   Eyes: Negative for visual disturbance.  Respiratory: Positive for cough. Negative for shortness of breath and wheezing.   Cardiovascular: Negative for chest pain, palpitations and leg swelling.  Gastrointestinal: Positive for constipation. Negative for abdominal distention, abdominal pain and diarrhea.  Genitourinary: Negative for difficulty urinating and dysuria.  Musculoskeletal: Positive for arthralgias and gait problem. Negative for back pain, joint swelling and  myalgias.  Neurological: Positive for speech difficulty and weakness. Negative for dizziness, tremors, seizures, syncope, facial asymmetry, light-headedness, numbness and headaches.  Psychiatric/Behavioral: Positive for agitation, confusion and hallucinations. Negative for behavioral problems. The patient is nervous/anxious.     Immunization History  Administered Date(s) Administered  . Influenza Inj Mdck Quad Pf 02/03/2016  . Influenza Whole 01/24/2012, 01/29/2013  . Influenza-Unspecified 01/20/2014, 02/05/2015  . PPD Test 10/10/2011  . Pneumococcal Conjugate-13 11/25/2015  . Pneumococcal Polysaccharide-23 02/10/2004   Pertinent  Health Maintenance Due  Topic Date Due  . INFLUENZA VACCINE  11/15/2016  . PNA vac Low Risk Adult  Completed  . DEXA SCAN  Excluded   Fall Risk  08/03/2016  Falls in the past year? No   Functional Status Survey:    Vitals:   09/14/16 1602    BP: (!) 151/69  Pulse: 75  Resp: 18  Temp: 98.7 F (37.1 C)  SpO2: 95%  Weight: 163 lb (73.9 kg)   Body mass index is 31.83 kg/m.  Wt Readings from Last 3 Encounters:  09/14/16 163 lb (73.9 kg)  08/21/16 163 lb (73.9 kg)  08/02/16 165 lb (74.8 kg)   Physical Exam  Constitutional: No distress.  HENT:  Head: Normocephalic and atraumatic.  Nose: Nose normal.  Mouth/Throat: Oropharyngeal exudate (yellow) present.  Dry mouth  Eyes: Conjunctivae are normal. Pupils are equal, round, and reactive to light. Right eye exhibits no discharge. Left eye exhibits no discharge.  Neck: No JVD present.  Cardiovascular: Normal rate and regular rhythm.   No murmur heard. Pulmonary/Chest: Effort normal. No respiratory distress. She has wheezes. She has rales (both lungs mid back and down).  Abdominal: Soft. Bowel sounds are normal. She exhibits distension (slight, could not palpate bladder). She exhibits no mass. There is no tenderness. There is no rebound and no guarding.  Genitourinary: Rectum normal. Rectal exam shows no external hemorrhoid, no internal hemorrhoid, no fissure, no mass, no tenderness and anal tone normal.  Musculoskeletal: She exhibits no edema or tenderness.  Lymphadenopathy:    She has no cervical adenopathy.  Neurological: She is alert.  Disoriented, able to follow commands  Skin: Skin is warm and dry. She is not diaphoretic.  Psychiatric:  anxious  Nursing note and vitals reviewed.   Labs reviewed:  Recent Labs  02/10/16  NA 142  K 5.3  BUN 19  CREATININE 1.0    Recent Labs  02/10/16  AST 29  ALT 31  ALKPHOS 74    Recent Labs  11/16/15 0516 02/10/16  WBC 11.8 5.8  HGB 14.6 15.2  HCT 44 47*  PLT 256 233   Lab Results  Component Value Date   TSH 1.27 04/06/2015   No results found for: HGBA1C Lab Results  Component Value Date   CHOL 171 08/07/2013   HDL 42 08/07/2013   LDLCALC 109 08/07/2013   TRIG 102 08/07/2013    Significant Diagnostic  Results in last 30 days:  No results found.  Assessment/Plan  1. Cough With sputum production No RX at this point due to goals of care Most likely due to aspiration She has prn duonebs and I asked the nurse to give one and see if this helps with her wheezing.  2. Esophageal dysmotility Continue puree diet and thickened liquids Asp prec Goals of care are comfort based End of life  3. Slow transit constipation Give dulcolax supp x 1 dose now  4. Anxiety state Continue scheduled Ativan at 1p and 8p, as well  as prn Ativan   5. FTT (failure to thrive) in adult End of life Use Roxanol 5 mg q4 hrs prn SOB/pain (I ask the nurse to go ahead and give her a dose since she appeared uncomfortable for my visit)   Family/ staff Communication: discussed with caregiver and nurse on the unit  Labs/tests ordered:  NA, followed by hospice

## 2016-10-12 ENCOUNTER — Non-Acute Institutional Stay (SKILLED_NURSING_FACILITY): Payer: Medicare Other | Admitting: Adult Health

## 2016-10-12 ENCOUNTER — Encounter: Payer: Self-pay | Admitting: Adult Health

## 2016-10-12 DIAGNOSIS — J3089 Other allergic rhinitis: Secondary | ICD-10-CM

## 2016-10-12 DIAGNOSIS — R1319 Other dysphagia: Secondary | ICD-10-CM

## 2016-10-12 DIAGNOSIS — R1310 Dysphagia, unspecified: Secondary | ICD-10-CM

## 2016-10-12 DIAGNOSIS — H1033 Unspecified acute conjunctivitis, bilateral: Secondary | ICD-10-CM | POA: Diagnosis not present

## 2016-10-12 DIAGNOSIS — F411 Generalized anxiety disorder: Secondary | ICD-10-CM

## 2016-10-12 DIAGNOSIS — K5901 Slow transit constipation: Secondary | ICD-10-CM

## 2016-10-12 DIAGNOSIS — F01518 Vascular dementia, unspecified severity, with other behavioral disturbance: Secondary | ICD-10-CM

## 2016-10-12 DIAGNOSIS — R059 Cough, unspecified: Secondary | ICD-10-CM

## 2016-10-12 DIAGNOSIS — R05 Cough: Secondary | ICD-10-CM

## 2016-10-12 DIAGNOSIS — F0151 Vascular dementia with behavioral disturbance: Secondary | ICD-10-CM

## 2016-10-12 NOTE — Progress Notes (Signed)
Location:  Occupational psychologist of Service:  SNF (31) Provider:   Cindi Carbon, ANP [place] [phone]   Gayland Curry, DO  Patient Care Team: Gayland Curry, DO as PCP - General (Geriatric Medicine) Community, Well Josue Hector, Dellis Filbert, MD as Consulting Physician (Gastroenterology) Melina Schools, MD as Consulting Physician (Orthopedic Surgery)  Extended Emergency Contact Information Primary Emergency Contact: [facility] Phone: [phone] Relation: None Secondary Emergency Contact: [facility] Address: [address]          [place]  [place] Home Phone: [phone] Relation: None  Code Status:  DNR Goals of care: Advanced Directive information Advanced Directives 09/14/2016  Does Patient Have a Medical Advance Directive? Yes  Type of Advance Directive Out of facility DNR (pink MOST or yellow form);Healthcare Power of Attorney  Does patient want to make changes to medical advance directive? -  Copy of [place] in Chart? Yes  Pre-existing out of facility DNR order (yellow form or pink MOST form) Pink MOST form placed in chart (order not valid for inpatient use);Yellow form placed in chart (order not valid for inpatient use)  Some encounter information is confidential and restricted. Go to Review Flowsheets activity to see all data.     Chief Complaint  Patient presents with  . Medical Management of Chronic Issues    HPI:  Pt is a 83 y.o. female seen today for medical management of chronic diseases.  She is currently under hospice care due to dementia with probable CVA, dysphagia and aspiration.  Two weeks ago she experienced a significant decline and it was felt that she was transitioning but then she slightly improved.  She has chronic anxiety and is on scheduled ativan. She was fairly calm for my visit and was not anxious. Currently on puree diet with HTL, continues to cough  with sputum production. More difficulty swallowing pills. Used Roxanol 3 x in the past two weeks for sob/discomfort.  Needs help removing secretions from her mouth. Ativan used 1 x in the past two weeks. Reports eye irritation, swelling, erythema x 1 day   Past Medical History:  Diagnosis Date  . Abnormality of gait 2007  . Allergic rhinitis 07/23/2012  . Anemia, unspecified 2005  . Anxiety state, unspecified 2005  . Basal cell carcinoma, face 03/20/2013  . Closed fracture of sternum 2006  . Closed fracture of two ribs 2013   left 5,6th  . Depression   . Depression with anxiety   . Depressive disorder, not elsewhere classified 2005  . Disturbance of salivary secretion 2007  . Dysphagia    with chronic aspiration  . External hemorrhoids without mention of complication 4081  . GERD (gastroesophageal reflux disease) 2005  . Hyperglycemia   . Hypertonicity of bladder 2006  . Hyponatremia   . Insomnia, unspecified 2005  . Malignant neoplasm of breast (female), unspecified site 47  . Neoplasm of unspecified nature of breast 2000  . Osteoarthrosis, unspecified whether generalized or localized, unspecified site 2005  . Other and unspecified hyperlipidemia 2005  . Other seborrheic dermatitis 2007  . Pain in joint, shoulder region 2008  . Pain in joint, shoulder region 10/21/2012  . Senile cataract, unspecified 2005  . Senile osteoporosis 2000  . Subdural hemorrhage ([place]) 2007  . Unspecified essential hypertension 2005  . Unspecified malignant neoplasm of skin of other and unspecified parts of face 2012  . Unspecified urinary incontinence 2005  . Unspecified venous (peripheral) insufficiency 2001  .  Urinary tract infection, site not specified 2013  . Vertebral fracture, closed 2006  . Vitamin D deficiency 2009   Past Surgical History:  Procedure Laterality Date  . ABDOMINAL HYSTERECTOMY    . APPENDECTOMY    . BREAST SURGERY Right    Mastectomy  . CYST EXCISION Left 2011    Calcified cyst L shin  . ESOPHAGOGASTRODUODENOSCOPY ENDOSCOPY  07/11/2013   Dr. Watt Climes hiatus hernia  . INTRAMEDULLARY (IM) NAIL INTERTROCHANTERIC Right 02/15/2014   Procedure: INTRAMEDULLARY (IM) NAIL INTERTROCHANTRIC;  Surgeon: Melina Schools, MD;  Location: [facility];  Service: Orthopedics;  Laterality: Right;  . JOINT REPLACEMENT Left    Knee  . MOHS SURGERY  2010   Nose    Allergies  Allergen Reactions  . Other     "Epidural medication" per NH record  . Sulfa Antibiotics     Allergies as of 10/12/2016      Reactions   Other    "Epidural medication" per NH record   Sulfa Antibiotics       Medication List       Accurate as of 10/12/16 10:15 AM. Always use your most recent med list.          atropine 1 % ophthalmic solution Place 4 drops under the tongue every 4 (four) hours as needed.   AVEENO DAILY MOISTURIZER Lotn Apply topically 2 (two) times daily. To legs and feet   bisacodyl 10 MG suppository Commonly known as:  DULCOLAX Place 10 mg rectally as needed for moderate constipation.   carboxymethylcellulose 0.5 % Soln Commonly known as:  REFRESH PLUS Place 2 drops into both eyes 2 (two) times daily.   hydrocortisone cream 1 % Apply 1 application topically. Apply as needed for itching   ipratropium-albuterol 0.5-2.5 (3) MG/3ML Soln Commonly known as:  DUONEB Take 3 mLs by nebulization every 6 (six) hours as needed (wheezing).   loratadine 10 MG tablet Commonly known as:  CLARITIN Take 10 mg by mouth daily.   LORazepam 1 MG tablet Commonly known as:  ATIVAN Take 1 mg by mouth 2 (two) times daily. At 1pm and 8pm and 1mg  twice daily for anxiety. And 1 mg q 2 hrs prn   metoprolol tartrate 25 MG tablet Commonly known as:  LOPRESSOR Take 12.5 mg by mouth 2 (two) times daily.   morphine 20 MG/ML concentrated solution Commonly known as:  ROXANOL Take 5 mg by mouth every 4 (four) hours as needed for severe pain.   polyvinyl alcohol 1.4 % ophthalmic  solution Commonly known as:  LIQUIFILM TEARS Place 2 drops into both eyes as needed for dry eyes.       Review of Systems  Constitutional: Positive for appetite change. Negative for activity change, chills, diaphoresis, fatigue, fever and unexpected weight change.  HENT: Positive for trouble swallowing. Negative for congestion, postnasal drip, rhinorrhea, sinus pressure and sore throat.   Eyes: Negative for visual disturbance.  Respiratory: Positive for cough. Negative for shortness of breath and wheezing.   Cardiovascular: Negative for chest pain, palpitations and leg swelling.  Gastrointestinal: Negative for abdominal distention, abdominal pain, constipation and diarrhea.  Genitourinary: Negative for difficulty urinating and dysuria.  Musculoskeletal: Positive for arthralgias and gait problem. Negative for back pain, joint swelling and myalgias.  Neurological: Positive for speech difficulty and weakness. Negative for dizziness, tremors, seizures, syncope, facial asymmetry, light-headedness, numbness and headaches.  Psychiatric/Behavioral: Positive for agitation, confusion and hallucinations. Negative for behavioral problems. The patient is nervous/anxious.     Immunization  History  Administered Date(s) Administered  . Influenza Inj Mdck Quad Pf 02/03/2016  . Influenza Whole 01/24/2012, 01/29/2013  . Influenza-Unspecified 01/20/2014, 02/05/2015  . PPD Test 10/10/2011  . Pneumococcal Conjugate-13 11/25/2015  . Pneumococcal Polysaccharide-23 02/10/2004   Pertinent  Health Maintenance Due  Topic Date Due  . INFLUENZA VACCINE  11/15/2016  . PNA vac Low Risk Adult  Completed  . DEXA SCAN  Excluded   Fall Risk  08/03/2016  Falls in the past year? No   Functional Status Survey:    Vitals:   10/12/16 1009  BP: (!) 152/80  Pulse: 77  Resp: 20  Temp: 98 F (36.7 C)  SpO2: 94%  Weight: 158 lb 9.6 oz (71.9 kg)   Body mass index is 30.97 kg/m.  Wt Readings from Last 3  Encounters:  10/12/16 158 lb 9.6 oz (71.9 kg)  09/14/16 163 lb (73.9 kg)  08/21/16 163 lb (73.9 kg)   Physical Exam  Constitutional: No distress.  HENT:  Head: Normocephalic and atraumatic.  Nose: Nose normal.  Mouth/Throat: Oropharyngeal exudate (yellow) present.  Dry mouth  Eyes: Pupils are equal, round, and reactive to light. Right eye exhibits no discharge. Left eye exhibits no discharge.  Erythema swelling to both upper and lower lids, erythema to bilat conjunctiva  Neck: No JVD present.  Cardiovascular: Normal rate and regular rhythm.   No murmur heard. Pulmonary/Chest: Effort normal. No respiratory distress. She has no wheezes. She has rales (both lungs mid back and down).  Abdominal: Soft. Bowel sounds are normal. She exhibits no distension and no mass. There is no tenderness. There is no rebound and no guarding.  Genitourinary: Rectum normal. Rectal exam shows no external hemorrhoid, no internal hemorrhoid, no fissure, no mass, no tenderness and anal tone normal.  Musculoskeletal: She exhibits no edema or tenderness.  Lymphadenopathy:    She has no cervical adenopathy.  Neurological: She is alert.  Disoriented, able to follow commands  Skin: Skin is warm and dry. She is not diaphoretic.  Psychiatric: She has a normal mood and affect.  Nursing note and vitals reviewed.   Labs reviewed:  Recent Labs  02/10/16  NA 142  K 5.3  BUN 19  CREATININE 1.0    Recent Labs  02/10/16  AST 29  ALT 31  ALKPHOS 74    Recent Labs  11/16/15 0516 02/10/16  WBC 11.8 5.8  HGB 14.6 15.2  HCT 44 47*  PLT 256 233   Lab Results  Component Value Date   TSH 1.27 04/06/2015   No results found for: HGBA1C Lab Results  Component Value Date   CHOL 171 08/07/2013   HDL 42 08/07/2013   LDLCALC 109 08/07/2013   TRIG 102 08/07/2013    Significant Diagnostic Results in last 30 days:  No results found.  Assessment/Plan 1. Acute conjunctivitis of both eyes, unspecified acute  conjunctivitis type Erythromycin 0.5 in 5 x day for 7 days  2. Esophageal dysphagia Continue puree diet with HTL as tolerated  3. Vascular dementia with behavior disturbance Progressive cognitive and functional losses Goals of care are comfort basd Followed by hospice End of life  4. Anxiety state Controlled Continue ativan 1mg  BID and q 2 hrs prn  5. Non-seasonal allergic rhinitis due to other allergic trigger Continue claritin 10 mg qd  6. Slow transit constipation Continue dulcolax supp prn   7. Cough Add oral suction to the room Duoneb q 6 hr prn  Family/ staff Communication: discussed with caregiver and  nurse on the unit  Labs/tests ordered:  NA, followed by hospice

## 2016-10-24 ENCOUNTER — Non-Acute Institutional Stay (SKILLED_NURSING_FACILITY): Payer: Medicare Other | Admitting: Internal Medicine

## 2016-10-24 ENCOUNTER — Encounter: Payer: Self-pay | Admitting: Internal Medicine

## 2016-10-24 DIAGNOSIS — R1319 Other dysphagia: Secondary | ICD-10-CM

## 2016-10-24 DIAGNOSIS — F411 Generalized anxiety disorder: Secondary | ICD-10-CM | POA: Diagnosis not present

## 2016-10-24 DIAGNOSIS — R1310 Dysphagia, unspecified: Secondary | ICD-10-CM | POA: Diagnosis not present

## 2016-10-24 DIAGNOSIS — R569 Unspecified convulsions: Secondary | ICD-10-CM | POA: Diagnosis not present

## 2016-10-24 DIAGNOSIS — F28 Other psychotic disorder not due to a substance or known physiological condition: Secondary | ICD-10-CM | POA: Diagnosis not present

## 2016-10-24 DIAGNOSIS — F0151 Vascular dementia with behavioral disturbance: Secondary | ICD-10-CM

## 2016-10-24 DIAGNOSIS — F01518 Vascular dementia, unspecified severity, with other behavioral disturbance: Secondary | ICD-10-CM

## 2016-10-24 DIAGNOSIS — R627 Adult failure to thrive: Secondary | ICD-10-CM

## 2016-10-24 NOTE — Progress Notes (Signed)
Patient ID: [name], female   DOB: 10-30-1916, 83 y.o.   MRN: [id number]  Location:  [facility] Room Number: [place] of Service:  SNF ([phone]) Provider:   Gayland Curry, DO  Patient Care Team: Gayland Curry, DO as PCP - General (Geriatric Medicine) Community, Well Josue Hector, Dellis Filbert, MD as Consulting Physician (Gastroenterology) Melina Schools, MD as Consulting Physician (Orthopedic Surgery)  Extended Emergency Contact Information Primary Emergency Contact: [facility] Phone: [phone] Relation: None Secondary Emergency Contact: [facility] Address: [address]          [place]  [place] Home Phone: [phone] Relation: None  Code Status:  DNR, hospice Goals of care: Advanced Directive information Advanced Directives 10/24/2016  Does Patient Have a Medical Advance Directive? Yes  Type of Advance Directive Out of facility DNR (pink MOST or yellow form);[place];Living will  Does patient want to make changes to medical advance directive? -  Copy of [place] in Chart? Yes  Pre-existing out of facility DNR order (yellow form or pink MOST form) Yellow form placed in chart (order not valid for inpatient use);Pink MOST form placed in chart (order not valid for inpatient use)  Some encounter information is confidential and restricted. Go to Review Flowsheets activity to see all data.   Chief Complaint  Patient presents with  . Medical Management of Chronic Issues    routine visit    HPI:  Pt is a 83 y.o. female seen today for medical management of chronic diseases.  She is in skilled care and on hospice, has a caregiver at bedside, as well.  On hospice for dementia with probable CVA, dysphagia and aspiration.   Last month she experienced a significant decline and it was felt that she was actively dying but then she slightly improved. She continues to have more  difficulty with speech, increased confusion and weakness.    She has chronic anxiety and is on scheduled ativan at higher doses. She was fairly calm for my visit and was not anxious.  She did not want me to leave at the end of the visit.  She is unable to really communicate what's bothering her now with dysarthric speech.    Currently on puree diet with HTL, continues to cough with sputum production. More difficulty swallowing pills.  Used Roxanol 3 x in the past two weeks for sob/discomfort.  Needs help removing secretions from her mouth.  Secretions have been a huge issue for a long time for her, but she used to be able to cough them up, but is now needing suctioning.   Past Medical History:  Diagnosis Date  . Abnormality of gait 2007  . Allergic rhinitis 07/23/2012  . Anemia, unspecified 2005  . Anxiety state, unspecified 2005  . Basal cell carcinoma, face 03/20/2013  . Closed fracture of sternum 2006  . Closed fracture of two ribs 2013   left 5,6th  . Depression   . Depression with anxiety   . Depressive disorder, not elsewhere classified 2005  . Disturbance of salivary secretion 2007  . Dysphagia    with chronic aspiration  . External hemorrhoids without mention of complication 4403  . GERD (gastroesophageal reflux disease) 2005  . Hyperglycemia   . Hypertonicity of bladder 2006  . Hyponatremia   . Insomnia, unspecified 2005  . Malignant neoplasm of breast (female), unspecified site 3  . Neoplasm of unspecified nature of breast 2000  . Osteoarthrosis, unspecified whether  generalized or localized, unspecified site 2005  . Other and unspecified hyperlipidemia 2005  . Other seborrheic dermatitis 2007  . Pain in joint, shoulder region 2008  . Pain in joint, shoulder region 10/21/2012  . Senile cataract, unspecified 2005  . Senile osteoporosis 2000  . Subdural hemorrhage ([place]) 2007  . Unspecified essential hypertension 2005  . Unspecified malignant neoplasm of skin of other  and unspecified parts of face 2012  . Unspecified urinary incontinence 2005  . Unspecified venous (peripheral) insufficiency 2001  . Urinary tract infection, site not specified 2013  . Vertebral fracture, closed 2006  . Vitamin D deficiency 2009   Past Surgical History:  Procedure Laterality Date  . ABDOMINAL HYSTERECTOMY    . APPENDECTOMY    . BREAST SURGERY Right    Mastectomy  . CYST EXCISION Left 2011   Calcified cyst L shin  . ESOPHAGOGASTRODUODENOSCOPY ENDOSCOPY  07/11/2013   Dr. Watt Climes hiatus hernia  . INTRAMEDULLARY (IM) NAIL INTERTROCHANTERIC Right 02/15/2014   Procedure: INTRAMEDULLARY (IM) NAIL INTERTROCHANTRIC;  Surgeon: Melina Schools, MD;  Location: [facility];  Service: Orthopedics;  Laterality: Right;  . JOINT REPLACEMENT Left    Knee  . MOHS SURGERY  2010   Nose    Allergies  Allergen Reactions  . Other     "Epidural medication" per NH record  . Sulfa Antibiotics     Outpatient Encounter Prescriptions as of 10/24/2016  Medication Sig  . acetaminophen (TYLENOL) 650 MG suppository Place 650 mg rectally every 4 (four) hours as needed for fever.  Marland Kitchen atropine 1 % ophthalmic solution Place 4 drops under the tongue every 4 (four) hours as needed.  . bisacodyl (DULCOLAX) 10 MG suppository Place 10 mg rectally as needed for moderate constipation.  . carboxymethylcellulose (REFRESH PLUS) 0.5 % SOLN Place 2 drops into both eyes 2 (two) times daily.   . hydrocortisone cream 1 % Apply 1 application topically. Apply as needed for itching  . ipratropium-albuterol (DUONEB) 0.5-2.5 (3) MG/3ML SOLN Take 3 mLs by nebulization every 6 (six) hours as needed (wheezing).  Marland Kitchen loratadine (CLARITIN) 10 MG tablet Take 10 mg by mouth daily.  Marland Kitchen LORazepam (ATIVAN) 1 MG tablet Take 1 mg by mouth 2 (two) times daily. At 1pm and 8pm and 1mg  twice daily for anxiety. And 1 mg q 2 hrs prn  . metoprolol tartrate (LOPRESSOR) 25 MG tablet Take 12.5 mg by mouth 2 (two) times daily.   Marland Kitchen morphine (ROXANOL) 20  MG/ML concentrated solution Take 5 mg by mouth every 4 (four) hours as needed for severe pain.   . polyvinyl alcohol (LIQUIFILM TEARS) 1.4 % ophthalmic solution Place 2 drops into both eyes as needed for dry eyes.  . Sunscreens (AVEENO DAILY MOISTURIZER) LOTN Apply topically 2 (two) times daily. To legs and feet  . talc (ZEASORB) powder Apply topically as needed (after each diaper change).   No facility-administered encounter medications on file as of 10/24/2016.     Review of Systems  Constitutional: Positive for activity change, appetite change and fatigue. Negative for chills and fever.  HENT: Positive for congestion and hearing loss.   Eyes: Positive for visual disturbance.       Glasses, poor vision  Respiratory: Positive for cough and shortness of breath.   Cardiovascular: Negative for chest pain, palpitations and leg swelling.  Gastrointestinal: Negative for abdominal pain, constipation, nausea and vomiting.  Genitourinary: Negative for dysuria.       Incontinence  Musculoskeletal: Positive for arthralgias and gait problem.  Wheelchair bound   Neurological: Positive for tremors and weakness. Negative for dizziness.  Psychiatric/Behavioral: Positive for agitation, confusion and hallucinations. Negative for behavioral problems and sleep disturbance. The patient is nervous/anxious.     Immunization History  Administered Date(s) Administered  . Influenza Inj Mdck Quad Pf 02/03/2016  . Influenza Whole 01/24/2012, 01/29/2013  . Influenza-Unspecified 01/20/2014, 02/05/2015  . PPD Test 10/10/2011  . Pneumococcal Conjugate-13 11/25/2015  . Pneumococcal Polysaccharide-23 02/10/2004   Pertinent  Health Maintenance Due  Topic Date Due  . INFLUENZA VACCINE  11/15/2016  . PNA vac Low Risk Adult  Completed  . DEXA SCAN  Excluded   Fall Risk  08/03/2016  Falls in the past year? No   Functional Status Survey:    Vitals:   10/24/16 1251  BP: (!) 150/83  Pulse: 66  Resp: 15    Temp: 98 F (36.7 C)  TempSrc: Oral  SpO2: 94%  Weight: 150 lb (68 kg)   Body mass index is 29.29 kg/m. Physical Exam  Constitutional: No distress.  HENT:  Head: Normocephalic and atraumatic.  Cardiovascular: Normal rate, regular rhythm, normal heart sounds and intact distal pulses.   Pulmonary/Chest:  Coarse wet rhonchi more pronounced than usual and more secretions present  Abdominal: Soft. Bowel sounds are normal. She exhibits no distension. There is no tenderness.  Musculoskeletal:  3+/5 strength now  Neurological: She is alert.  Dysarthric speech, much harder to understand   Skin: Skin is warm and dry. There is pallor.  Psychiatric:  Reaches for things that are not there    Labs reviewed:  Recent Labs  02/10/16  NA 142  K 5.3  BUN 19  CREATININE 1.0    Recent Labs  02/10/16  AST 29  ALT 31  ALKPHOS 74    Recent Labs  11/16/15 0516 02/10/16  WBC 11.8 5.8  HGB 14.6 15.2  HCT 44 47*  PLT 256 233   Lab Results  Component Value Date   TSH 1.27 04/06/2015   No results found for: HGBA1C Lab Results  Component Value Date   CHOL 171 08/07/2013   HDL 42 08/07/2013   LDLCALC 109 08/07/2013   TRIG 102 08/07/2013    Assessment/Plan 1. Esophageal dysphagia -seems this has progressed since recent suspected CVA -cont aspiration precautions, modified diet and assistance with meals  2. Vascular dementia with behavior disturbance -progressed also since last stroke event, cont supportive care and hospice  3. Seizures ([place]) -has ativan as needed for these episodes which are not psychiatric and are due to focus in brain likely from one of her strokes  4. Generalized anxiety disorder -also has the ativan for panic attacks when needed in addition to her scheduled dosing which she's been on long term  5. Other psychotic disorder not due to substance or known physiological condition -has hallucinations for about a year related to progression of dementia, and  some may be from her depression, as well -cont caregiver support and ativan for anxiety related to these -seroquel was stopped   6. FTT (failure to thrive) in adult -ongoing, losing weight, has severe protein calorie malnutrition as a result of her progressing dementia, receiving hospice care for comfort and family support  Family/ staff Communication: discussed with CNA in room, SNF nurse  Labs/tests ordered:  No new due to goals of care  [place]. Sujata Maines, D.O. [facility] [address], [place] [address] Cell Phone (Mon-Fri 8am-5pm):  [phone] On  Call:  [phone] & follow prompts after 5pm & weekends Office Phone:  [phone] Office Fax:  [phone]

## 2016-11-17 ENCOUNTER — Encounter: Payer: Self-pay | Admitting: Adult Health

## 2016-11-17 ENCOUNTER — Non-Acute Institutional Stay (SKILLED_NURSING_FACILITY): Payer: Medicare Other | Admitting: Adult Health

## 2016-11-17 DIAGNOSIS — R32 Unspecified urinary incontinence: Secondary | ICD-10-CM | POA: Diagnosis not present

## 2016-11-17 DIAGNOSIS — R1310 Dysphagia, unspecified: Secondary | ICD-10-CM | POA: Diagnosis not present

## 2016-11-17 DIAGNOSIS — R569 Unspecified convulsions: Secondary | ICD-10-CM

## 2016-11-17 DIAGNOSIS — I679 Cerebrovascular disease, unspecified: Secondary | ICD-10-CM

## 2016-11-17 DIAGNOSIS — R1319 Other dysphagia: Secondary | ICD-10-CM

## 2016-11-17 DIAGNOSIS — F411 Generalized anxiety disorder: Secondary | ICD-10-CM | POA: Diagnosis not present

## 2016-11-17 DIAGNOSIS — R627 Adult failure to thrive: Secondary | ICD-10-CM | POA: Diagnosis not present

## 2016-11-17 DIAGNOSIS — F0151 Vascular dementia with behavioral disturbance: Secondary | ICD-10-CM

## 2016-11-17 DIAGNOSIS — K5901 Slow transit constipation: Secondary | ICD-10-CM

## 2016-11-17 DIAGNOSIS — F01518 Vascular dementia, unspecified severity, with other behavioral disturbance: Secondary | ICD-10-CM

## 2016-11-17 NOTE — Progress Notes (Signed)
Location:  [facility] of Service:  SNF (31) Provider:   Cindi Carbon, ANP [place] [phone]   Gayland Curry, DO  Patient Care Team: Gayland Curry, DO as PCP - General (Geriatric Medicine) Community, Well Josue Hector, Dellis Filbert, MD as Consulting Physician (Gastroenterology) Melina Schools, MD as Consulting Physician (Orthopedic Surgery)  Extended Emergency Contact Information Primary Emergency Contact: [facility] Phone: [phone] Relation: None Secondary Emergency Contact: [facility] Address: [address]          [place]  [place] Home Phone: [phone] Relation: None  Code Status:  DNR Goals of care: Advanced Directive information Advanced Directives 10/24/2016  Does Patient Have a Medical Advance Directive? Yes  Type of Advance Directive Out of facility DNR (pink MOST or yellow form);[place];Living will  Does patient want to make changes to medical advance directive? -  Copy of [place] in Chart? Yes  Pre-existing out of facility DNR order (yellow form or pink MOST form) Yellow form placed in chart (order not valid for inpatient use);Pink MOST form placed in chart (order not valid for inpatient use)  Some encounter information is confidential and restricted. Go to Review Flowsheets activity to see all data.     Chief Complaint  Patient presents with  . Medical Management of Chronic Issues    HPI:  Pt is a 83 y.o. female seen today for medical management of chronic diseases.  She is currently under hospice care due to dementia with probable CVA, dysphagia and aspiration.  She has had a slow decline and now spends most of the day in bed due to weakness and lethargy. Her speech has become more slurred over time as well. She most likely has had multiple strokes.  She is currently on puree diet with HTL, continues to cough with sputum production.  This is a long standing issue that is also worse over time.  Used Roxanol 3 x in the past two weeks for sob/discomfort.   She has a hx of allergic rhinitis and atopy but is currently off claritin without s/e.   FTT: weights continue to decline due to poor intake Wt Readings from Last 3 Encounters:  11/17/16 145 lb 11.2 oz (66.1 kg)  10/24/16 150 lb (68 kg)  10/12/16 158 lb 9.6 oz (71.9 kg)   Seizures: has not had any new episodes or the need for ativan to alleviate these  Anxiety: has chronic anxiety and uses ativan twice a day. Her caregiver reports that she can be verbally redirected and enjoys the companionship of one on one care.   Past Medical History:  Diagnosis Date  . Abnormality of gait 2007  . Allergic rhinitis 07/23/2012  . Anemia, unspecified 2005  . Anxiety state, unspecified 2005  . Basal cell carcinoma, face 03/20/2013  . Closed fracture of sternum 2006  . Closed fracture of two ribs 2013   left 5,6th  . Depression   . Depression with anxiety   . Depressive disorder, not elsewhere classified 2005  . Disturbance of salivary secretion 2007  . Dysphagia    with chronic aspiration  . External hemorrhoids without mention of complication 2542  . GERD (gastroesophageal reflux disease) 2005  . Hyperglycemia   . Hypertonicity of bladder 2006  . Hyponatremia   . Insomnia, unspecified 2005  . Malignant neoplasm of breast (female), unspecified site 6  . Neoplasm of unspecified nature of breast 2000  . Osteoarthrosis, unspecified whether generalized  or localized, unspecified site 2005  . Other and unspecified hyperlipidemia 2005  . Other seborrheic dermatitis 2007  . Pain in joint, shoulder region 2008  . Pain in joint, shoulder region 10/21/2012  . Senile cataract, unspecified 2005  . Senile osteoporosis 2000  . Subdural hemorrhage ([place]) 2007  . Unspecified essential hypertension 2005  . Unspecified malignant neoplasm of skin of other and unspecified parts of face 2012    . Unspecified urinary incontinence 2005  . Unspecified venous (peripheral) insufficiency 2001  . Urinary tract infection, site not specified 2013  . Vertebral fracture, closed 2006  . Vitamin D deficiency 2009   Past Surgical History:  Procedure Laterality Date  . ABDOMINAL HYSTERECTOMY    . APPENDECTOMY    . BREAST SURGERY Right    Mastectomy  . CYST EXCISION Left 2011   Calcified cyst L shin  . ESOPHAGOGASTRODUODENOSCOPY ENDOSCOPY  07/11/2013   Dr. Watt Climes hiatus hernia  . INTRAMEDULLARY (IM) NAIL INTERTROCHANTERIC Right 02/15/2014   Procedure: INTRAMEDULLARY (IM) NAIL INTERTROCHANTRIC;  Surgeon: Melina Schools, MD;  Location: [facility];  Service: Orthopedics;  Laterality: Right;  . JOINT REPLACEMENT Left    Knee  . MOHS SURGERY  2010   Nose    Allergies  Allergen Reactions  . Other     "Epidural medication" per NH record  . Sulfa Antibiotics     Allergies as of 11/17/2016      Reactions   Other    "Epidural medication" per NH record   Sulfa Antibiotics       Medication List       Accurate as of 11/17/16 12:44 PM. Always use your most recent med list.          acetaminophen 650 MG suppository Commonly known as:  TYLENOL Place 650 mg rectally every 4 (four) hours as needed for fever.   atropine 1 % ophthalmic solution Place 4 drops under the tongue every 4 (four) hours as needed.   AVEENO DAILY MOISTURIZER Lotn Apply topically 2 (two) times daily. To legs and feet   bisacodyl 10 MG suppository Commonly known as:  DULCOLAX Place 10 mg rectally as needed for moderate constipation.   carboxymethylcellulose 0.5 % Soln Commonly known as:  REFRESH PLUS Place 2 drops into both eyes 2 (two) times daily.   hydrocortisone cream 1 % Apply 1 application topically. Apply as needed for itching   ipratropium-albuterol 0.5-2.5 (3) MG/3ML Soln Commonly known as:  DUONEB Take 3 mLs by nebulization every 6 (six) hours as needed (wheezing).   LORazepam 1 MG tablet Commonly  known as:  ATIVAN Take 1 mg by mouth 2 (two) times daily. At 1pm and 8pm and 1mg  twice daily for anxiety. And 1 mg q 2 hrs prn   metoprolol tartrate 25 MG tablet Commonly known as:  LOPRESSOR Take 12.5 mg by mouth 2 (two) times daily.   morphine 20 MG/ML concentrated solution Commonly known as:  ROXANOL Take 5 mg by mouth every 4 (four) hours as needed for severe pain.   polyvinyl alcohol 1.4 % ophthalmic solution Commonly known as:  LIQUIFILM TEARS Place 2 drops into both eyes as needed for dry eyes.   ZEASORB powder Generic drug:  talc Apply topically as needed (after each diaper change).       Review of Systems  Unable to perform ROS: Dementia    Immunization History  Administered Date(s) Administered  . Influenza Inj Mdck Quad Pf 02/03/2016  . Influenza Whole 01/24/2012, 01/29/2013  .  Influenza-Unspecified 01/20/2014, 02/05/2015  . PPD Test 10/10/2011  . Pneumococcal Conjugate-13 11/25/2015  . Pneumococcal Polysaccharide-23 02/10/2004   Pertinent  Health Maintenance Due  Topic Date Due  . INFLUENZA VACCINE  11/15/2016  . PNA vac Low Risk Adult  Completed  . DEXA SCAN  Excluded   Fall Risk  08/03/2016  Falls in the past year? No   Functional Status Survey:    Vitals:   11/17/16 1033  BP: 133/63  Pulse: 74  Resp: 16  Temp: (!) 96.7 F (35.9 C)  SpO2: 93%  Weight: 145 lb 11.2 oz (66.1 kg)   Body mass index is 28.46 kg/m.  Wt Readings from Last 3 Encounters:  11/17/16 145 lb 11.2 oz (66.1 kg)  10/24/16 150 lb (68 kg)  10/12/16 158 lb 9.6 oz (71.9 kg)   Physical Exam  Constitutional: No distress.  HENT:  Head: Normocephalic and atraumatic.  Nose: Nose normal.  Mouth/Throat: No oropharyngeal exudate.  Eyes: Pupils are equal, round, and reactive to light. Conjunctivae are normal. Right eye exhibits no discharge. Left eye exhibits no discharge.  Neck: No JVD present.  Cardiovascular: Normal rate and regular rhythm.   No murmur  heard. Pulmonary/Chest: Effort normal. No respiratory distress. She has no wheezes. She has rales (both lungs mid back and down).  Abdominal: Soft. Bowel sounds are normal. She exhibits no distension and no mass. There is no tenderness. There is no rebound and no guarding.  Genitourinary: Rectum normal. Rectal exam shows no external hemorrhoid, no internal hemorrhoid, no fissure, no mass, no tenderness and anal tone normal.  Musculoskeletal: She exhibits no edema or tenderness.  Lymphadenopathy:    She has no cervical adenopathy.  Neurological: She is alert.  Disoriented, able to follow commands  Skin: Skin is warm and dry. She is not diaphoretic.  Psychiatric: She has a normal mood and affect.  Nursing note and vitals reviewed.   Labs reviewed:  Recent Labs  02/10/16  NA 142  K 5.3  BUN 19  CREATININE 1.0    Recent Labs  02/10/16  AST 29  ALT 31  ALKPHOS 74    Recent Labs  02/10/16  WBC 5.8  HGB 15.2  HCT 47*  PLT 233   Lab Results  Component Value Date   TSH 1.27 04/06/2015   No results found for: HGBA1C Lab Results  Component Value Date   CHOL 171 08/07/2013   HDL 42 08/07/2013   LDLCALC 109 08/07/2013   TRIG 102 08/07/2013    Significant Diagnostic Results in last 30 days:  No results found.  Assessment/Plan  1. Vascular dementia with behavior disturbance Progressive cognitive and functional decline Remains in a supportive environment followed by hospice  2. Slow transit constipation No issues, has dulcolax ordered prn  3. Esophageal dysphagia Originally had esophageal issues but now most likely has oral and esophageal issues Continue modified diet and asp prec  4. Urinary incontinence, unspecified type Keep clean and dry  5. Anxiety state Controlled Continue ativan 1mg  BID and emotional support and redirection  6. Cerebrovascular disease Has most likely had CVAs that contributed to her current condition No intervention or monitoring due  to goals of care  7. Seizures ([facility]) No new events  8. FTT Continues to lose weight consistent with progressive decline and end of life Followed by hospice  Family/ staff Communication: discussed with caregiver and nurse on the unit  Labs/tests ordered:  NA, followed by hospice

## 2016-11-30 ENCOUNTER — Other Ambulatory Visit: Payer: Self-pay | Admitting: *Deleted

## 2016-11-30 MED ORDER — LORAZEPAM 1 MG PO TABS: ORAL_TABLET | ORAL | 0 refills | Status: AC

## 2016-11-30 NOTE — Telephone Encounter (Signed)
[facility] #[phone] Fax: [phone]  

## 2016-12-14 ENCOUNTER — Encounter: Payer: Self-pay | Admitting: Adult Health

## 2016-12-14 ENCOUNTER — Non-Acute Institutional Stay (SKILLED_NURSING_FACILITY): Payer: Medicare Other | Admitting: Adult Health

## 2016-12-14 DIAGNOSIS — F411 Generalized anxiety disorder: Secondary | ICD-10-CM

## 2016-12-14 DIAGNOSIS — R634 Abnormal weight loss: Secondary | ICD-10-CM | POA: Insufficient documentation

## 2016-12-14 DIAGNOSIS — J9611 Chronic respiratory failure with hypoxia: Secondary | ICD-10-CM | POA: Diagnosis not present

## 2016-12-14 DIAGNOSIS — R1319 Other dysphagia: Secondary | ICD-10-CM

## 2016-12-14 DIAGNOSIS — F0151 Vascular dementia with behavioral disturbance: Secondary | ICD-10-CM | POA: Diagnosis not present

## 2016-12-14 DIAGNOSIS — R1310 Dysphagia, unspecified: Secondary | ICD-10-CM | POA: Diagnosis not present

## 2016-12-14 DIAGNOSIS — J9610 Chronic respiratory failure, unspecified whether with hypoxia or hypercapnia: Secondary | ICD-10-CM | POA: Insufficient documentation

## 2016-12-14 DIAGNOSIS — R627 Adult failure to thrive: Secondary | ICD-10-CM

## 2016-12-14 DIAGNOSIS — F01518 Vascular dementia, unspecified severity, with other behavioral disturbance: Secondary | ICD-10-CM

## 2016-12-14 DIAGNOSIS — I679 Cerebrovascular disease, unspecified: Secondary | ICD-10-CM

## 2016-12-14 NOTE — Progress Notes (Signed)
Location:  Occupational psychologist of Service:  SNF (31) Provider:   Cindi Carbon, ANP [place] [phone]   Gayland Curry, DO  Patient Care Team: Gayland Curry, DO as PCP - General (Geriatric Medicine) Community, [facility] Josue Hector, Dellis Filbert, MD as Consulting Physician (Gastroenterology) Melina Schools, MD as Consulting Physician (Orthopedic Surgery)  Extended Emergency Contact Information Primary Emergency Contact: [facility] Phone: [phone] Relation: None Secondary Emergency Contact: [facility] Address: [address]          [place]  [place] Phone: [phone] Relation: None  Code Status:  DNR Goals of care: Advanced Directive information Advanced Directives 10/24/2016  Does Patient Have a Medical Advance Directive? Yes  Type of Advance Directive Out of facility DNR (pink MOST or yellow form);[place];Living will  Does patient want to make changes to medical advance directive? -  Copy of [place] in Chart? Yes  Pre-existing out of facility DNR order (yellow form or pink MOST form) Yellow form placed in chart (order not valid for inpatient use);Pink MOST form placed in chart (order not valid for inpatient use)  Some encounter information is confidential and restricted. Go to Review Flowsheets activity to see all data.     Chief Complaint  Patient presents with  . Medical Management of Chronic Issues    HPI:  [name] is a 83 y.o. female seen today for medical management of chronic diseases.  Currently resides in skilled care and followed by Hospice due to dysphagia, weight loss, CVA, and FTT.  She had another decline in July where she had further weakness to the left sided. She continues to have a chronic cough with sputum production and uses prn oral suctioning.  She has had several episodes one sided weakness with probable CVA.  Her goals of care are  comfort based with no hospitalizations or aggressive interventions.  Oxygen dependent 2L, sats in the mid 90's  Dysphagia: losing weight, coughing, currently on puree diet with HTL  Wt Readings from Last 3 Encounters:  12/14/16 145 lb 11.2 oz (66.1 kg)  11/17/16 145 lb 11.2 oz (66.1 kg)  10/24/16 150 lb (68 kg)    PRN ativan 1 x in two weeks for anxiety. Staff report that she does not like to be left alone and can become anxious easily.  She has had two falls in the passed few days due to attempts to get oob and she was found on the mat without obvious injury. For my visit she gripped by hand and requested that I not leave the room multiple times.   Past Medical History:  Diagnosis Date  . Abnormality of gait 2007  . Allergic rhinitis 07/23/2012  . Anemia, unspecified 2005  . Anxiety state, unspecified 2005  . Basal cell carcinoma, face 03/20/2013  . Closed fracture of sternum 2006  . Closed fracture of two ribs 2013   left 5,6th  . Depression   . Depression with anxiety   . Depressive disorder, not elsewhere classified 2005  . Disturbance of salivary secretion 2007  . Dysphagia    with chronic aspiration  . External hemorrhoids without mention of complication 4132  . GERD (gastroesophageal reflux disease) 2005  . Hyperglycemia   . Hypertonicity of bladder 2006  . Hyponatremia   . Insomnia, unspecified 2005  . Malignant neoplasm of breast (female), unspecified site 39  . Neoplasm of unspecified nature of breast 2000  . Osteoarthrosis, unspecified whether generalized  or localized, unspecified site 2005  . Other and unspecified hyperlipidemia 2005  . Other seborrheic dermatitis 2007  . Pain in joint, shoulder region 2008  . Pain in joint, shoulder region 10/21/2012  . Senile cataract, unspecified 2005  . Senile osteoporosis 2000  . Subdural hemorrhage ([place]) 2007  . Unspecified essential hypertension 2005  . Unspecified malignant neoplasm of skin of other and unspecified  parts of face 2012  . Unspecified urinary incontinence 2005  . Unspecified venous (peripheral) insufficiency 2001  . Urinary tract infection, site not specified 2013  . Vertebral fracture, closed 2006  . Vitamin D deficiency 2009   Past Surgical History:  Procedure Laterality Date  . ABDOMINAL HYSTERECTOMY    . APPENDECTOMY    . BREAST SURGERY Right    Mastectomy  . CYST EXCISION Left 2011   Calcified cyst L shin  . ESOPHAGOGASTRODUODENOSCOPY ENDOSCOPY  07/11/2013   Dr. Watt Climes hiatus hernia  . INTRAMEDULLARY (IM) NAIL INTERTROCHANTERIC Right 02/15/2014   Procedure: INTRAMEDULLARY (IM) NAIL INTERTROCHANTRIC;  Surgeon: Melina Schools, MD;  Location: [facility];  Service: Orthopedics;  Laterality: Right;  . JOINT REPLACEMENT Left    Knee  . MOHS SURGERY  2010   Nose    Allergies  Allergen Reactions  . Other     "Epidural medication" per NH record  . Sulfa Antibiotics     Outpatient Encounter Prescriptions as of 12/14/2016  Medication Sig  . acetaminophen (TYLENOL) 650 MG suppository Place 650 mg rectally every 4 (four) hours as needed for fever.  Marland Kitchen atropine 1 % ophthalmic solution Place 4 drops under the tongue every 4 (four) hours as needed.  . bisacodyl (DULCOLAX) 10 MG suppository Place 10 mg rectally as needed for moderate constipation.  . carboxymethylcellulose (REFRESH PLUS) 0.5 % SOLN Place 2 drops into both eyes 2 (two) times daily.   . hydrocortisone cream 1 % Apply 1 application topically. Apply as needed for itching  . ipratropium-albuterol (DUONEB) 0.5-2.5 (3) MG/3ML SOLN Take 3 mLs by nebulization every 6 (six) hours as needed (wheezing).  . LORazepam (ATIVAN) 1 MG tablet Take one tablet by mouth at 1PM and 8PM for anxiety  . metoprolol tartrate (LOPRESSOR) 25 MG tablet Take 12.5 mg by mouth 2 (two) times daily.   Marland Kitchen morphine (ROXANOL) 20 MG/ML concentrated solution Take 5 mg by mouth every 4 (four) hours as needed for severe pain.   . polyvinyl alcohol (LIQUIFILM TEARS)  1.4 % ophthalmic solution Place 2 drops into both eyes as needed for dry eyes.  . Sunscreens (AVEENO DAILY MOISTURIZER) LOTN Apply topically 2 (two) times daily. To legs and feet  . talc (ZEASORB) powder Apply topically as needed (after each diaper change).   No facility-administered encounter medications on file as of 12/14/2016.     Review of Systems  Unable to perform ROS: Dementia    Immunization History  Administered Date(s) Administered  . Influenza Inj Mdck Quad Pf 02/03/2016  . Influenza Whole 01/24/2012, 01/29/2013  . Influenza-Unspecified 01/20/2014, 02/05/2015  . PPD Test 10/10/2011  . Pneumococcal Conjugate-13 11/25/2015  . Pneumococcal Polysaccharide-23 02/10/2004   Pertinent  Health Maintenance Due  Topic Date Due  . INFLUENZA VACCINE  11/15/2016  . PNA vac Low Risk Adult  Completed  . DEXA SCAN  Excluded   Fall Risk  08/03/2016  Falls in the past year? No   Functional Status Survey:    Vitals:   12/14/16 1149  BP: (!) 164/93  Pulse: 61  Resp: 17  Temp: (!)  97.3 F (36.3 C)  SpO2: 95%  Weight: 145 lb 11.2 oz (66.1 kg)   Body mass index is 28.46 kg/m. Physical Exam  Constitutional: No distress.  HENT:  Head: Normocephalic and atraumatic.  Nose: Nose normal.  Mouth/Throat: Oropharyngeal exudate present.  Eyes: Right eye exhibits no discharge. Left eye exhibits no discharge.  Neck: No JVD present.  Cardiovascular: Normal rate and regular rhythm.   No murmur heard. Pulmonary/Chest: Effort normal. No respiratory distress. She has rales (bibasilar).  Abdominal: Soft. Bowel sounds are normal.  Lymphadenopathy:    She has no cervical adenopathy.  Neurological: She is alert.  Oriented to self only with slurred speech. Slight left facial droop.  Left arm weakness noted.   Skin: Skin is warm and dry. She is not diaphoretic.  Psychiatric:  anxious    Labs reviewed:  Recent Labs  02/10/16  NA 142  K 5.3  BUN 19  CREATININE 1.0    Recent Labs   02/10/16  AST 29  ALT 31  ALKPHOS 74    Recent Labs  02/10/16  WBC 5.8  HGB 15.2  HCT 47*  PLT 233   Lab Results  Component Value Date   TSH 1.27 04/06/2015   No results found for: HGBA1C Lab Results  Component Value Date   CHOL 171 08/07/2013   HDL 42 08/07/2013   LDLCALC 109 08/07/2013   TRIG 102 08/07/2013    Significant Diagnostic Results in last 30 days:  No results found.  Assessment/Plan  1. Anxiety state Due to worsening anxiety and falls Increase Ativan to 0.5 mg q am, continue 1 mg at 1  pm and 8 pm  2. Chronic respiratory failure with hypoxia (HCC) Due to aspiration, kyphosis, and atelectasis Continue oxygen for comfort  3. Esophageal dysphagia Continue puree diet with HTNL  4. Vascular dementia with behavior disturbance Progressive cognitive and functional losses  Comfort care  5. Weight loss Due to poor intake No supplements due to goals of care  6. FTT (failure to thrive) in adult Ms. Dame has had several episodes of lethargy where we thought she was nearing the end due to her dementia, dysphagia, aspiration, and respiratory failure. She has pulled through each time with less functional than before. Our goal is to provide her with comfort and dignity and not to prolong her life in anyway  7. Cerebrovascular disease Multiple episodes of one sided weakness with functional decline consistent with CVA.  No intervention due to goals of care.    Family/ staff Communication: discussed with staff/resident/hospice  Labs/tests ordered:  NA

## 2017-02-06 ENCOUNTER — Encounter: Payer: Self-pay | Admitting: Internal Medicine

## 2017-02-06 ENCOUNTER — Non-Acute Institutional Stay (SKILLED_NURSING_FACILITY): Payer: Medicare Other | Admitting: Internal Medicine

## 2017-02-06 DIAGNOSIS — K5901 Slow transit constipation: Secondary | ICD-10-CM | POA: Diagnosis not present

## 2017-02-06 DIAGNOSIS — R1310 Dysphagia, unspecified: Secondary | ICD-10-CM

## 2017-02-06 DIAGNOSIS — F0151 Vascular dementia with behavioral disturbance: Secondary | ICD-10-CM

## 2017-02-06 DIAGNOSIS — K117 Disturbances of salivary secretion: Secondary | ICD-10-CM

## 2017-02-06 DIAGNOSIS — I10 Essential (primary) hypertension: Secondary | ICD-10-CM | POA: Diagnosis not present

## 2017-02-06 DIAGNOSIS — R1319 Other dysphagia: Secondary | ICD-10-CM

## 2017-02-06 DIAGNOSIS — R634 Abnormal weight loss: Secondary | ICD-10-CM

## 2017-02-06 DIAGNOSIS — F01518 Vascular dementia, unspecified severity, with other behavioral disturbance: Secondary | ICD-10-CM

## 2017-02-06 NOTE — Progress Notes (Signed)
Patient ID: [name], female   DOB: 05-Feb-1917, 83 y.o.   MRN: [id number]  Location:  [facility] Room Number: 127 Place of Service:  SNF [phone]) Provider:  Gayland Curry, DO  Patient Care Team: Gayland Curry, DO as PCP - General (Geriatric Medicine) Community, Well Josue Hector, Dellis Filbert, MD as Consulting Physician (Gastroenterology) Melina Schools, MD as Consulting Physician (Orthopedic Surgery)  Extended Emergency Contact Information Primary Emergency Contact: [facility] Phone: [phone] Relation: None Secondary Emergency Contact: [facility] Address: [address]          [place]  [place] Home Phone: [phone] Relation: None  Code Status: DNR, on hospice Goals of care: Advanced Directive information Advanced Directives 02/06/2017  Does Patient Have a Medical Advance Directive? Yes  Type of Advance Directive Out of facility DNR (pink MOST or yellow form);Living will;Healthcare Power of Attorney  Does patient want to make changes to medical advance directive? -  Copy of [place] in Chart? Yes  Pre-existing out of facility DNR order (yellow form or pink MOST form) Yellow form placed in chart (order not valid for inpatient use);Pink MOST form placed in chart (order not valid for inpatient use)  Some encounter information is confidential and restricted. Go to Review Flowsheets activity to see all data.     Chief Complaint  Patient presents with  . Medical Management of Chronic Issues    Routine Visit    HPI:  Pt is a 83 y.o. female seen today for medical management of chronic diseases.  [name] is in her bed resting at the time of visit. She seems to be comfortable but is not very verbal. She says she is very sleepy.  She stays in bed sleeping most of the time per staff. Had an increase in secretions recently and was started on Levsin which seems to have helped per staff. She  is on Hospice care for probable weakness, CVA, FTT, dysphagia, aspiration and dementia. Has PRN orders for Roxanol for SOB/comfort  Dementia- On ativan for her anxiety/behaviors. A caregiver stays with her as well. She is opting to sleep most of the time and isn't interacting as much.   Increased Secretions- Levsin seems to be helping per the staff.  Weight loss & Dysphagia- Is on puree diet but does take in very much  Hypertension- Remains on metoprolol BP is acceptable for goals of care  Has a bandaid on her right middle toe. Podiatry saw her today and removed a nail that was hanging and left orders for triple antibiotic cream to toe  Past Medical History:  Diagnosis Date  . Abnormality of gait 2007  . Allergic rhinitis 07/23/2012  . Anemia, unspecified 2005  . Anxiety state, unspecified 2005  . Basal cell carcinoma, face 03/20/2013  . Closed fracture of sternum 2006  . Closed fracture of two ribs 2013   left 5,6th  . Depression   . Depression with anxiety   . Depressive disorder, not elsewhere classified 2005  . Disturbance of salivary secretion 2007  . Dysphagia    with chronic aspiration  . External hemorrhoids without mention of complication 9767  . GERD (gastroesophageal reflux disease) 2005  . Hyperglycemia   . Hypertonicity of bladder 2006  . Hyponatremia   . Insomnia, unspecified 2005  . Malignant neoplasm of breast (female), unspecified site 40  . Neoplasm of unspecified nature of breast 2000  . Osteoarthrosis, unspecified whether generalized or localized, unspecified site 2005  .  Other and unspecified hyperlipidemia 2005  . Other seborrheic dermatitis 2007  . Pain in joint, shoulder region 2008  . Pain in joint, shoulder region 10/21/2012  . Senile cataract, unspecified 2005  . Senile osteoporosis 2000  . Subdural hemorrhage ([place]) 2007  . Unspecified essential hypertension 2005  . Unspecified malignant neoplasm of skin of other and unspecified parts of face 2012    . Unspecified urinary incontinence 2005  . Unspecified venous (peripheral) insufficiency 2001  . Urinary tract infection, site not specified 2013  . Vertebral fracture, closed 2006  . Vitamin D deficiency 2009   Past Surgical History:  Procedure Laterality Date  . ABDOMINAL HYSTERECTOMY    . APPENDECTOMY    . BREAST SURGERY Right    Mastectomy  . CYST EXCISION Left 2011   Calcified cyst L shin  . ESOPHAGOGASTRODUODENOSCOPY ENDOSCOPY  07/11/2013   Dr. Watt Climes hiatus hernia  . INTRAMEDULLARY (IM) NAIL INTERTROCHANTERIC Right 02/15/2014   Procedure: INTRAMEDULLARY (IM) NAIL INTERTROCHANTRIC;  Surgeon: Melina Schools, MD;  Location: [facility];  Service: Orthopedics;  Laterality: Right;  . JOINT REPLACEMENT Left    Knee  . MOHS SURGERY  2010   Nose    Allergies  Allergen Reactions  . Other     "Epidural medication" per NH record  . Sulfa Antibiotics     Outpatient Encounter Prescriptions as of 02/06/2017  Medication Sig  . acetaminophen (TYLENOL) 650 MG suppository Place 650 mg rectally every 4 (four) hours as needed for fever.  Marland Kitchen atropine 1 % ophthalmic solution Place 4 drops under the tongue every 4 (four) hours as needed.  . bisacodyl (DULCOLAX) 10 MG suppository Place 10 mg rectally as needed for moderate constipation.  . carboxymethylcellulose (REFRESH PLUS) 0.5 % SOLN Place 2 drops into both eyes 2 (two) times daily.   . hydrocortisone cream 1 % Apply 1 application topically. Apply as needed for itching  . hyoscyamine (LEVSIN) 0.125 MG/5ML ELIX Take 0.125 mg by mouth every 6 (six) hours as needed.  Marland Kitchen ipratropium-albuterol (DUONEB) 0.5-2.5 (3) MG/3ML SOLN Take 3 mLs by nebulization every 6 (six) hours as needed (wheezing).  . LORazepam (ATIVAN) 1 MG tablet Take one tablet by mouth at 1PM and 8PM for anxiety  . metoprolol tartrate (LOPRESSOR) 25 MG tablet Take 12.5 mg by mouth 2 (two) times daily.   Marland Kitchen morphine (ROXANOL) 20 MG/ML concentrated solution Take 5 mg by mouth every 4  (four) hours as needed for severe pain.   . polyvinyl alcohol (LIQUIFILM TEARS) 1.4 % ophthalmic solution Place 2 drops into both eyes as needed for dry eyes.  . Sunscreens (AVEENO DAILY MOISTURIZER) LOTN Apply topically 2 (two) times daily. To legs and feet  . talc (ZEASORB) powder Apply topically as needed (after each diaper change).   No facility-administered encounter medications on file as of 02/06/2017.     Review of Systems  Unable to perform ROS: Dementia (Spoke with staff )    Immunization History  Administered Date(s) Administered  . Influenza Inj Mdck Quad Pf 02/03/2016  . Influenza Whole 01/24/2012, 01/29/2013  . Influenza-Unspecified 01/20/2014, 02/05/2015  . PPD Test 10/10/2011  . Pneumococcal Conjugate-13 11/25/2015  . Pneumococcal Polysaccharide-23 02/10/2004   Pertinent  Health Maintenance Due  Topic Date Due  . INFLUENZA VACCINE  11/15/2016  . PNA vac Low Risk Adult  Completed  . DEXA SCAN  Excluded   Fall Risk  08/03/2016  Falls in the past year? No   Functional Status Survey:    Vitals:  02/06/17 1402  BP: (!) 148/60  Pulse: (!) 57  Resp: 20  Temp: 97.6 F (36.4 C)  TempSrc: Oral  SpO2: 99%   There is no height or weight on file to calculate BMI. Physical Exam  Constitutional: She appears lethargic. She appears cachectic. She is easily aroused.  Cardiovascular: Normal rate, regular rhythm and normal heart sounds.   Pulmonary/Chest: Effort normal.  Abdominal: Soft. Bowel sounds are normal.  Neurological: She is easily aroused. She appears lethargic.  Skin: Skin is warm, dry and intact.     Psychiatric: She is slowed. Cognition and memory are impaired. She exhibits abnormal recent memory and abnormal remote memory.    Labs reviewed:  Recent Labs  02/10/16  NA 142  K 5.3  BUN 19  CREATININE 1.0    Recent Labs  02/10/16  AST 29  ALT 31  ALKPHOS 74    Recent Labs  02/10/16  WBC 5.8  HGB 15.2  HCT 47*  PLT 233   Lab Results   Component Value Date   TSH 1.27 04/06/2015   No results found for: HGBA1C Lab Results  Component Value Date   CHOL 171 08/07/2013   HDL 42 08/07/2013   LDLCALC 109 08/07/2013   TRIG 102 08/07/2013    Significant Diagnostic Results in last 30 days:  No results found.  Assessment/Plan 1. Essential hypertension Within range for goal, continue metoprolol 12.5mg  bid  2. Vascular dementia with behavior disturbance Continue with ativan for behaviors or anxiety. Appreciate the caregivers companionship to decrease anxiety.  Pt on hospice care for terminal condition.  3. Slow transit constipation Controlled with dulcolax, Use dulcolax PRN as ordered  4. Weight loss Weight loss likely to continue as disease/dementia progresses   5. Esophageal dysphagia Continue pureed diet as long as she is able.  Staff assist with meals  6. Increased oropharyngeal secretions Levsin is helping with secretions. Plan to continue.  Family/ staff Communication: Discussed with nursing and caregiver  Labs/tests ordered: None due to goals of care  Deserae Jennings L. Janeen Watson, D.O. [facility] [address], [place] [address] Cell Phone (Mon-Fri 8am-5pm):  [phone] On Call:  [phone] & follow prompts after 5pm & weekends Office Phone:  [phone] Office Fax:  [phone]

## 2017-02-27 ENCOUNTER — Other Ambulatory Visit: Payer: Self-pay | Admitting: Adult Health

## 2017-03-05 ENCOUNTER — Non-Acute Institutional Stay (SKILLED_NURSING_FACILITY): Payer: Medicare Other | Admitting: Adult Health

## 2017-03-05 DIAGNOSIS — R1310 Dysphagia, unspecified: Secondary | ICD-10-CM

## 2017-03-05 DIAGNOSIS — F411 Generalized anxiety disorder: Secondary | ICD-10-CM

## 2017-03-05 DIAGNOSIS — R634 Abnormal weight loss: Secondary | ICD-10-CM

## 2017-03-05 DIAGNOSIS — J9611 Chronic respiratory failure with hypoxia: Secondary | ICD-10-CM

## 2017-03-05 DIAGNOSIS — I10 Essential (primary) hypertension: Secondary | ICD-10-CM

## 2017-03-05 DIAGNOSIS — F0151 Vascular dementia with behavioral disturbance: Secondary | ICD-10-CM | POA: Diagnosis not present

## 2017-03-05 DIAGNOSIS — F01518 Vascular dementia, unspecified severity, with other behavioral disturbance: Secondary | ICD-10-CM

## 2017-03-05 DIAGNOSIS — I679 Cerebrovascular disease, unspecified: Secondary | ICD-10-CM | POA: Diagnosis not present

## 2017-03-15 ENCOUNTER — Encounter: Payer: Self-pay | Admitting: Adult Health

## 2017-03-15 DIAGNOSIS — I679 Cerebrovascular disease, unspecified: Secondary | ICD-10-CM | POA: Insufficient documentation

## 2017-03-15 NOTE — Assessment & Plan Note (Signed)
[name] continues to need scheduled, as well as prn ativan to control her anxiety. Her goals of care are comfort based so these medications should not be tapered. Also continue roxanol for any perceived pain.

## 2017-03-15 NOTE — Assessment & Plan Note (Signed)
Continue modified diet and asp prec. No feeding tubes, comfort care.

## 2017-03-15 NOTE — Assessment & Plan Note (Signed)
Previous hx of probable CVA with residual left facial droop and worsening dysphagia No preventative measures have been taken regarding this diagnosis due to her goals of care.

## 2017-03-15 NOTE — Assessment & Plan Note (Signed)
Remains dependent on 2L of oxygen due to kyphoscoliosis, and chronic aspiration

## 2017-03-15 NOTE — Assessment & Plan Note (Signed)
Progressive decline in cognition and function consistent with the disease. Followed by hospice.

## 2017-03-15 NOTE — Assessment & Plan Note (Signed)
Controlled without medicaitons

## 2017-03-15 NOTE — Progress Notes (Signed)
Location:  Occupational psychologist of Service:  SNF (31) Provider:   Cindi Carbon, ANP [place] [phone]   Gayland Curry, DO  Patient Care Team: Gayland Curry, DO as PCP - General (Geriatric Medicine) Community, [facility] Josue Hector, Dellis Filbert, MD as Consulting Physician (Gastroenterology) Melina Schools, MD as Consulting Physician (Orthopedic Surgery)  Extended Emergency Contact Information Primary Emergency Contact: [facility] Phone: [phone] Relation: None Secondary Emergency Contact: [facility] Address: [address]          [place]  [place] Home Phone: [phone] Relation: None  Code Status:  DNR Goals of care: Advanced Directive information Advanced Directives 02/06/2017  Does Patient Have a Medical Advance Directive? Yes  Type of Advance Directive Out of facility DNR (pink MOST or yellow form);Living will;Healthcare Power of Attorney  Does patient want to make changes to medical advance directive? -  Copy of [place] in Chart? Yes  Pre-existing out of facility DNR order (yellow form or pink MOST form) Yellow form placed in chart (order not valid for inpatient use);Pink MOST form placed in chart (order not valid for inpatient use)  Some encounter information is confidential and restricted. Go to Review Flowsheets activity to see all data.     Chief Complaint  Patient presents with  . Medical Management of Chronic Issues    HPI:  Pt is a 83 y.o. female seen today for medical management of chronic diseases.  Currently resides in skilled care and followed by Hospice due to dementia, dysphagia, weight loss, CVA, and FTT.  Anxiety: she has periods of agitation and anxiety that are controlled with scheduled ativan. PRN Ativan has been used 1x in the past month Roxanol is also ordered and has been used 3 x in the past month for perceived pain (unknown source)  Oxygen  dependent 2L, sats in the mid 90's  Dysphagia: She continues to have a chronic cough but is using levsin for secretions and this has helped to dry them. She is on a puree diet with NTL  Wt Readings from Last 3 Encounters:  03/15/17 135 lb (61.2 kg)  12/14/16 145 lb 11.2 oz (66.1 kg)  11/17/16 145 lb 11.2 oz (66.1 kg)  See weights, she continues to lose weight due to poor intake   HTN: BP in the 150's at times, we are not treating this due to her goals of care and age No symptoms   Dementia: she is less verbal over time but can at times still let us know what she needs. She is not able to perform for MMSE scoring.   Cerebrovascular disease: previous hx of CVA with left facial droop.   Past Medical History:  Diagnosis Date  . Abnormality of gait 2007  . Allergic rhinitis 07/23/2012  . Anemia, unspecified 2005  . Anxiety state, unspecified 2005  . Basal cell carcinoma, face 03/20/2013  . Closed fracture of sternum 2006  . Closed fracture of two ribs 2013   left 5,6th  . Depression   . Depression with anxiety   . Depressive disorder, not elsewhere classified 2005  . Disturbance of salivary secretion 2007  . Dysphagia    with chronic aspiration  . External hemorrhoids without mention of complication 0093  . GERD (gastroesophageal reflux disease) 2005  . Hyperglycemia   . Hypertonicity of bladder 2006  . Hyponatremia   . Insomnia, unspecified 2005  . Malignant neoplasm of breast (female), unspecified site 36  . Neoplasm  of unspecified nature of breast 2000  . Osteoarthrosis, unspecified whether generalized or localized, unspecified site 2005  . Other and unspecified hyperlipidemia 2005  . Other seborrheic dermatitis 2007  . Pain in joint, shoulder region 2008  . Pain in joint, shoulder region 10/21/2012  . Senile cataract, unspecified 2005  . Senile osteoporosis 2000  . Subdural hemorrhage ([place]) 2007  . Unspecified essential hypertension 2005  . Unspecified malignant  neoplasm of skin of other and unspecified parts of face 2012  . Unspecified urinary incontinence 2005  . Unspecified venous (peripheral) insufficiency 2001  . Urinary tract infection, site not specified 2013  . Vertebral fracture, closed 2006  . Vitamin D deficiency 2009   Past Surgical History:  Procedure Laterality Date  . ABDOMINAL HYSTERECTOMY    . APPENDECTOMY    . BREAST SURGERY Right    Mastectomy  . CYST EXCISION Left 2011   Calcified cyst L shin  . ESOPHAGOGASTRODUODENOSCOPY ENDOSCOPY  07/11/2013   Dr. Watt Climes hiatus hernia  . INTRAMEDULLARY (IM) NAIL INTERTROCHANTERIC Right 02/15/2014   Procedure: INTRAMEDULLARY (IM) NAIL INTERTROCHANTRIC;  Surgeon: Melina Schools, MD;  Location: [facility];  Service: Orthopedics;  Laterality: Right;  . JOINT REPLACEMENT Left    Knee  . MOHS SURGERY  2010   Nose    Allergies  Allergen Reactions  . Other     "Epidural medication" per NH record  . Sulfa Antibiotics     Outpatient Encounter Medications as of 03/05/2017  Medication Sig  . acetaminophen (TYLENOL) 650 MG suppository Place 650 mg rectally every 4 (four) hours as needed for fever.  Marland Kitchen atropine 1 % ophthalmic solution Place 4 drops under the tongue every 4 (four) hours as needed.  . bisacodyl (DULCOLAX) 10 MG suppository Place 10 mg rectally as needed for moderate constipation.  . carboxymethylcellulose (REFRESH PLUS) 0.5 % SOLN Place 2 drops into both eyes 2 (two) times daily.   . hydrocortisone cream 1 % Apply 1 application topically. Apply as needed for itching  . hyoscyamine (LEVSIN) 0.125 MG/5ML ELIX Take 0.125 mg 4 (four) times daily by mouth.   Marland Kitchen ipratropium-albuterol (DUONEB) 0.5-2.5 (3) MG/3ML SOLN Take 3 mLs by nebulization every 6 (six) hours as needed (wheezing).  . LORazepam (ATIVAN) 1 MG tablet Take one tablet by mouth at 1PM and 8PM for anxiety  . metoprolol tartrate (LOPRESSOR) 25 MG tablet Take 12.5 mg by mouth 2 (two) times daily.   Marland Kitchen morphine (ROXANOL) 20 MG/ML  concentrated solution Take 5 mg by mouth every 4 (four) hours as needed for severe pain.   . polyvinyl alcohol (LIQUIFILM TEARS) 1.4 % ophthalmic solution Place 2 drops into both eyes as needed for dry eyes.  . Sunscreens (AVEENO DAILY MOISTURIZER) LOTN Apply topically 2 (two) times daily. To legs and feet  . talc (ZEASORB) powder Apply topically as needed (after each diaper change).   No facility-administered encounter medications on file as of 03/05/2017.     Review of Systems  Unable to perform ROS: Dementia    Immunization History  Administered Date(s) Administered  . Influenza Inj Mdck Quad Pf 02/03/2016  . Influenza Whole 01/24/2012, 01/29/2013  . Influenza-Unspecified 01/20/2014, 02/05/2015  . PPD Test 10/10/2011  . Pneumococcal Conjugate-13 11/25/2015  . Pneumococcal Polysaccharide-23 02/10/2004   Pertinent  Health Maintenance Due  Topic Date Due  . INFLUENZA VACCINE  Completed  . PNA vac Low Risk Adult  Completed  . DEXA SCAN  Discontinued   Fall Risk  08/03/2016  Falls in  the past year? No   Functional Status Survey:    Vitals:   03/15/17 1500  Weight: 145 lb (65.8 kg)   Body mass index is 28.32 kg/m. Physical Exam  Constitutional: No distress.  HENT:  Head: Normocephalic and atraumatic.  Nose: Nose normal.  Mouth/Throat: Oropharyngeal exudate present.  Eyes: Right eye exhibits no discharge. Left eye exhibits no discharge.  Neck: No JVD present.  Cardiovascular: Normal rate and regular rhythm.  No murmur heard. Pulmonary/Chest: Effort normal. No respiratory distress. She has rales (bibasilar).  Abdominal: Soft. Bowel sounds are normal.  Lymphadenopathy:    She has no cervical adenopathy.  Neurological: She is alert.  Oriented to self only with slurred speech. Slight left facial droop.  Left arm weakness noted.   Skin: Skin is warm and dry. She is not diaphoretic.  Psychiatric:  anxious    Labs reviewed: No results for input(s): NA, K, CL, CO2,  GLUCOSE, BUN, CREATININE, CALCIUM, MG, PHOS in the last 8760 hours. No results for input(s): AST, ALT, ALKPHOS, BILITOT, PROT, ALBUMIN in the last 8760 hours. No results for input(s): WBC, NEUTROABS, HGB, HCT, MCV, PLT in the last 8760 hours. Lab Results  Component Value Date   TSH 1.27 04/06/2015   No results found for: HGBA1C Lab Results  Component Value Date   CHOL 171 08/07/2013   HDL 42 08/07/2013   LDLCALC 109 08/07/2013   TRIG 102 08/07/2013    Significant Diagnostic Results in last 30 days:  No results found.  Assessment/Plan  Essential hypertension Controlled without medicaitons  Chronic respiratory failure (HCC) Remains dependent on 2L of oxygen due to kyphoscoliosis, and chronic aspiration  Dysphagia Continue modified diet and asp prec. No feeding tubes, comfort care.    Dementia with behavioral disturbance Progressive decline in cognition and function consistent with the disease. Followed by hospice.   Anxiety state Ms. Sleep continues to need scheduled, as well as prn ativan to control her anxiety. Her goals of care are comfort based so these medications should not be tapered. Also continue roxanol for any perceived pain.   Weight loss Supportive care only  Cerebrovascular disease Previous hx of probable CVA with residual left facial droop and worsening dysphagia No preventative measures have been taken regarding this diagnosis due to her goals of care.    Family/ staff Communication: discussed with staff/resident/hospice  Labs/tests ordered:  NA

## 2017-03-15 NOTE — Assessment & Plan Note (Signed)
Supportive care only 

## 2017-04-02 ENCOUNTER — Non-Acute Institutional Stay (SKILLED_NURSING_FACILITY): Payer: Medicare Other | Admitting: Adult Health

## 2017-04-02 DIAGNOSIS — R634 Abnormal weight loss: Secondary | ICD-10-CM

## 2017-04-02 DIAGNOSIS — F01518 Vascular dementia, unspecified severity, with other behavioral disturbance: Secondary | ICD-10-CM

## 2017-04-02 DIAGNOSIS — K5901 Slow transit constipation: Secondary | ICD-10-CM

## 2017-04-02 DIAGNOSIS — J9611 Chronic respiratory failure with hypoxia: Secondary | ICD-10-CM | POA: Diagnosis not present

## 2017-04-02 DIAGNOSIS — F0151 Vascular dementia with behavioral disturbance: Secondary | ICD-10-CM | POA: Diagnosis not present

## 2017-04-02 DIAGNOSIS — I679 Cerebrovascular disease, unspecified: Secondary | ICD-10-CM | POA: Diagnosis not present

## 2017-04-02 DIAGNOSIS — R1310 Dysphagia, unspecified: Secondary | ICD-10-CM | POA: Diagnosis not present

## 2017-04-02 DIAGNOSIS — F411 Generalized anxiety disorder: Secondary | ICD-10-CM

## 2017-04-10 ENCOUNTER — Encounter: Payer: Self-pay | Admitting: Adult Health

## 2017-04-10 NOTE — Progress Notes (Signed)
Location:  Occupational psychologist of Service:  SNF (31) Provider:   Cindi Carbon, ANP [place] [phone]   Gayland Curry, DO  Patient Care Team: Gayland Curry, DO as PCP - General (Geriatric Medicine) Community, Well Josue Hector, Dellis Filbert, MD as Consulting Physician (Gastroenterology) Melina Schools, MD as Consulting Physician (Orthopedic Surgery)  Extended Emergency Contact Information Primary Emergency Contact: [facility] Phone: [phone] Relation: None Secondary Emergency Contact: Edward [place] Address: [address]          [place]  [place] Home Phone: [phone] Relation: None  Code Status:  DNR Goals of care: Advanced Directive information Advanced Directives 02/06/2017  Does Patient Have a Medical Advance Directive? Yes  Type of Advance Directive Out of facility DNR (pink MOST or yellow form);Living will;Healthcare Power of Attorney  Does patient want to make changes to medical advance directive? -  Copy of [place] in Chart? Yes  Pre-existing out of facility DNR order (yellow form or pink MOST form) Yellow form placed in chart (order not valid for inpatient use);Pink MOST form placed in chart (order not valid for inpatient use)  Some encounter information is confidential and restricted. Go to Review Flowsheets activity to see all data.     Chief Complaint  Patient presents with  . Medical Management of Chronic Issues    HPI:  Pt is a 83 y.o. female seen today for medical management of chronic diseases.  Currently resides in skilled care and followed by Hospice due to dementia, dysphagia, weight loss, CVA, and FTT.  Anxiety: She remains on scheduled ativan and has not needed any prn dosing recently.  There are periods of anxiety about being alone but she is comforted by her personal caretaker.  Roxanol is also ordered and has been used 2 x in the month of Dec for  perceived pain  She has increased secretions at times due to dysphagia. Scheduled Levsin has helped and prn atropine gtts are used frequently.  Oxygen dependent 2L, sats in the mid 90's  Dysphagia: She continues to have a chronic cough but is using levsin for secretions and this has helped to dry them. She is on a puree diet with HTL  Wt Readings from Last 3 Encounters:  04/10/17 125 lb 9.6 oz (57 kg)  03/15/17 135 lb (61.2 kg)  12/14/16 145 lb 11.2 oz (66.1 kg)  See weights, she continues to lose weight due to poor intake  Dementia: Progressive decline in cognition and function.  She is dependent on staff for all ADLs  Constipation: prn dulcolax ordered but not used  Cerebrovascular disease: previous hx of CVA with left facial droop and dysarthria.  Functional status: incontinent, hoyer lift  Past Medical History:  Diagnosis Date  . Abnormality of gait 2007  . Allergic rhinitis 07/23/2012  . Anemia, unspecified 2005  . Anxiety state, unspecified 2005  . Basal cell carcinoma, face 03/20/2013  . Closed fracture of sternum 2006  . Closed fracture of two ribs 2013   left 5,6th  . Depression   . Depression with anxiety   . Depressive disorder, not elsewhere classified 2005  . Disturbance of salivary secretion 2007  . Dysphagia    with chronic aspiration  . External hemorrhoids without mention of complication 9326  . GERD (gastroesophageal reflux disease) 2005  . Hyperglycemia   . Hypertonicity of bladder 2006  . Hyponatremia   . Insomnia, unspecified 2005  . Malignant neoplasm of breast (female),  unspecified site 1990  . Neoplasm of unspecified nature of breast 2000  . Osteoarthrosis, unspecified whether generalized or localized, unspecified site 2005  . Other and unspecified hyperlipidemia 2005  . Other seborrheic dermatitis 2007  . Pain in joint, shoulder region 2008  . Pain in joint, shoulder region 10/21/2012  . Senile cataract, unspecified 2005  . Senile osteoporosis  2000  . Subdural hemorrhage ([place]) 2007  . Unspecified essential hypertension 2005  . Unspecified malignant neoplasm of skin of other and unspecified parts of face 2012  . Unspecified urinary incontinence 2005  . Unspecified venous (peripheral) insufficiency 2001  . Urinary tract infection, site not specified 2013  . Vertebral fracture, closed 2006  . Vitamin D deficiency 2009   Past Surgical History:  Procedure Laterality Date  . ABDOMINAL HYSTERECTOMY    . APPENDECTOMY    . BREAST SURGERY Right    Mastectomy  . CYST EXCISION Left 2011   Calcified cyst L shin  . ESOPHAGOGASTRODUODENOSCOPY ENDOSCOPY  07/11/2013   Dr. Watt Climes hiatus hernia  . INTRAMEDULLARY (IM) NAIL INTERTROCHANTERIC Right 02/15/2014   Procedure: INTRAMEDULLARY (IM) NAIL INTERTROCHANTRIC;  Surgeon: Melina Schools, MD;  Location: [facility];  Service: Orthopedics;  Laterality: Right;  . JOINT REPLACEMENT Left    Knee  . MOHS SURGERY  2010   Nose    Allergies  Allergen Reactions  . Other     "Epidural medication" per NH record  . Sulfa Antibiotics     Outpatient Encounter Medications as of 04/02/2017  Medication Sig  . LORazepam (ATIVAN) 0.5 MG tablet Take 0.5 mg by mouth daily with breakfast.  . acetaminophen (TYLENOL) 650 MG suppository Place 650 mg rectally every 4 (four) hours as needed for fever.  Marland Kitchen atropine 1 % ophthalmic solution Place 4 drops under the tongue every 4 (four) hours as needed.  . bisacodyl (DULCOLAX) 10 MG suppository Place 10 mg rectally as needed for moderate constipation.  . carboxymethylcellulose (REFRESH PLUS) 0.5 % SOLN Place 2 drops into both eyes 2 (two) times daily.   . hydrocortisone cream 1 % Apply 1 application topically. Apply as needed for itching  . hyoscyamine (LEVSIN) 0.125 MG/5ML ELIX Take 0.125 mg by mouth 2 (two) times daily. And qd prn  . ipratropium-albuterol (DUONEB) 0.5-2.5 (3) MG/3ML SOLN Take 3 mLs by nebulization every 6 (six) hours as needed (wheezing).  . LORazepam  (ATIVAN) 1 MG tablet Take one tablet by mouth at 1PM and 8PM for anxiety  . metoprolol tartrate (LOPRESSOR) 25 MG tablet Take 12.5 mg by mouth 2 (two) times daily.   Marland Kitchen morphine (ROXANOL) 20 MG/ML concentrated solution Take 5 mg by mouth every 4 (four) hours as needed for severe pain.   . polyvinyl alcohol (LIQUIFILM TEARS) 1.4 % ophthalmic solution Place 2 drops into both eyes as needed for dry eyes.  . Sunscreens (AVEENO DAILY MOISTURIZER) LOTN Apply topically 2 (two) times daily. To legs and feet  . talc (ZEASORB) powder Apply topically as needed (after each diaper change).   No facility-administered encounter medications on file as of 04/02/2017.     Review of Systems  Unable to perform ROS: Dementia    Immunization History  Administered Date(s) Administered  . Influenza Inj Mdck Quad Pf 02/03/2016  . Influenza Whole 01/24/2012, 01/29/2013  . Influenza-Unspecified 01/20/2014, 02/05/2015, 02/08/2017  . PPD Test 10/10/2011  . Pneumococcal Conjugate-13 11/25/2015  . Pneumococcal Polysaccharide-23 02/10/2004   Pertinent  Health Maintenance Due  Topic Date Due  . INFLUENZA VACCINE  Completed  .  PNA vac Low Risk Adult  Completed  . DEXA SCAN  Discontinued   Fall Risk  08/03/2016  Falls in the past year? No   Functional Status Survey:    Vitals:   04/10/17 0751  Weight: 125 lb 9.6 oz (57 kg)   Body mass index is 24.53 kg/m. Physical Exam  Constitutional: No distress.  HENT:  Head: Normocephalic and atraumatic.  Eyes: Right eye exhibits no discharge. Left eye exhibits no discharge.  Neck: No JVD present.  Cardiovascular: Normal rate and regular rhythm.  No murmur heard. Pulmonary/Chest: Effort normal. No respiratory distress. She has rales (bibasilar).  Abdominal: Soft. Bowel sounds are normal. She exhibits no distension.  Musculoskeletal: She exhibits no edema or tenderness.  Lymphadenopathy:    She has no cervical adenopathy.  Neurological: She is alert.  Oriented  to self only with slurred speech. Slight left facial droop.  Left arm weakness noted.   Skin: Skin is warm and dry. She is not diaphoretic.  Psychiatric: She has a normal mood and affect.    Labs reviewed: No results for input(s): NA, K, CL, CO2, GLUCOSE, BUN, CREATININE, CALCIUM, MG, PHOS in the last 8760 hours. No results for input(s): AST, ALT, ALKPHOS, BILITOT, PROT, ALBUMIN in the last 8760 hours. No results for input(s): WBC, NEUTROABS, HGB, HCT, MCV, PLT in the last 8760 hours. Lab Results  Component Value Date   TSH 1.27 04/06/2015   No results found for: HGBA1C Lab Results  Component Value Date   CHOL 171 08/07/2013   HDL 42 08/07/2013   LDLCALC 109 08/07/2013   TRIG 102 08/07/2013    Significant Diagnostic Results in last 30 days:  No results found.  Assessment/Plan  Dementia with behavioral disturbance Progressive decline cognition and function, complicated by CVA and left sided weakness. She would not benefit from memory medications. Her goals of care comfort based. Continue hospice support.    Dysphagia Continue modified diet, asp prec, and levsin with prn atropine for secretions.  Chronic respiratory failure (HCC) Continue oxygen at 2L. Needed for kyphosis, probable atelectasis, and aspiration  Constipation Continue prn dulcolax  Anxiety state Continue scheduled Ativan. I would also continue the prn ativan due to her hx of uncontrolled anxiety and progressive decline   Weight loss She continues to lose weight due to poor intake. Staff will continue to support her and help feed her.  No feeding tubes per her wishes.   Cerebrovascular disease previous hx of CVA with left facial droop and dysarthria. Comfort care.    Family/ staff Communication: discussed with staff/resident/hospice  Labs/tests ordered:  NA

## 2017-04-10 NOTE — Assessment & Plan Note (Signed)
Progressive decline cognition and function, complicated by CVA and left sided weakness. She would not benefit from memory medications. Her goals of care comfort based. Continue hospice support.

## 2017-04-10 NOTE — Assessment & Plan Note (Signed)
Continue scheduled Ativan. I would also continue the prn ativan due to her hx of uncontrolled anxiety and progressive decline

## 2017-04-10 NOTE — Assessment & Plan Note (Signed)
previous hx of CVA with left facial droop and dysarthria. Comfort care.

## 2017-04-10 NOTE — Assessment & Plan Note (Signed)
She continues to lose weight due to poor intake. Staff will continue to support her and help feed her.  No feeding tubes per her wishes.

## 2017-04-10 NOTE — Assessment & Plan Note (Signed)
Continue modified diet, asp prec, and levsin with prn atropine for secretions.

## 2017-04-10 NOTE — Assessment & Plan Note (Signed)
Continue prn dulcolax

## 2017-04-10 NOTE — Assessment & Plan Note (Signed)
Continue oxygen at 2L. Needed for kyphosis, probable atelectasis, and aspiration

## 2017-04-17 DEATH — deceased

## 2023-11-26 ENCOUNTER — Encounter: Payer: Self-pay | Admitting: Adult Health
